# Patient Record
Sex: Male | Born: 1940 | Race: White | Hispanic: No | Smoking: Never smoker
Health system: Southern US, Community
[De-identification: ages and names within clinical notes are randomized; demographics above are authoritative.]

## PROBLEM LIST (undated history)

## (undated) DIAGNOSIS — I10 Essential (primary) hypertension: Secondary | ICD-10-CM

## (undated) DIAGNOSIS — E119 Type 2 diabetes mellitus without complications: Secondary | ICD-10-CM

## (undated) DIAGNOSIS — C61 Malignant neoplasm of prostate: Secondary | ICD-10-CM

## (undated) HISTORY — DX: Malignant neoplasm of prostate: C61

---

## 2008-08-28 ENCOUNTER — Ambulatory Visit: Admission: RE | Admit: 2008-08-28 | Discharge: 2008-11-13 | Payer: Self-pay | Admitting: Radiation Oncology

## 2013-05-20 ENCOUNTER — Encounter: Payer: Self-pay | Admitting: Cardiovascular Disease

## 2013-06-19 ENCOUNTER — Other Ambulatory Visit: Payer: Self-pay | Admitting: Urology

## 2013-06-19 DIAGNOSIS — C61 Malignant neoplasm of prostate: Secondary | ICD-10-CM

## 2013-06-26 ENCOUNTER — Ambulatory Visit (INDEPENDENT_AMBULATORY_CARE_PROVIDER_SITE_OTHER): Payer: Medicare Other | Admitting: Cardiovascular Disease

## 2013-06-26 ENCOUNTER — Encounter: Payer: Self-pay | Admitting: Cardiovascular Disease

## 2013-06-26 VITALS — BP 144/103 | HR 99 | Ht 71.0 in | Wt 264.0 lb

## 2013-06-26 DIAGNOSIS — E785 Hyperlipidemia, unspecified: Secondary | ICD-10-CM

## 2013-06-26 DIAGNOSIS — I4891 Unspecified atrial fibrillation: Secondary | ICD-10-CM | POA: Insufficient documentation

## 2013-06-26 DIAGNOSIS — I10 Essential (primary) hypertension: Secondary | ICD-10-CM

## 2013-06-26 DIAGNOSIS — E119 Type 2 diabetes mellitus without complications: Secondary | ICD-10-CM

## 2013-06-26 MED ORDER — APIXABAN 5 MG PO TABS: 5.0000 mg | ORAL_TABLET | Freq: Two times a day (BID) | ORAL | Status: DC

## 2013-06-26 MED ORDER — METOPROLOL TARTRATE 50 MG PO TABS
50.0000 mg | ORAL_TABLET | Freq: Two times a day (BID) | ORAL | Status: DC
Start: 2013-06-26 — End: 2014-06-09

## 2013-06-26 NOTE — Progress Notes (Signed)
     [name] [name] Date of Birth  [birth year]       [facility] [address], [address], [place], [place]  [address]   [place], [place]  [address] [place]   Fax  [phone]     Fax [phone]  Problem List: 1. Atrial fibrillation 2. Hypertension 3. Diabetes mellitus 4. Hyperlipidemia  History of Present Illness:  [name] [name] presents today for newly discovered atrial fibrillation. He was sent to our office for a urology EKG. His son have atrial fibrillation and was worked into my schedule for further evaluation.  His Primary medical doctor is Dalia Heading, MD ( [place] ).  He does to see his medical doctor on a regular basis and has never been told about atrial fib.   He has no symptoms of atrial fibrillation. Cannot tell that his heartbeat is irregular. He exercises-walks 2 miles every day. He may have a little shortness breath when he walks up hills but this is very temporary.  He denies any chest pain, syncope, presyncope. He denies any PND or orthopnea.  CHADS VASC2  Score is 3 which corresponds to a high risk of stroke in atrial fibrillation.  Yearly risk of stroke without warfarin treatment is estimated at 3.2.     Current Outpatient Prescriptions  Medication Sig Dispense Refill  . fluticasone (FLONASE) 50 MCG/ACT nasal spray       . hydrALAZINE (APRESOLINE) 100 MG tablet       . latanoprost (XALATAN) 0.005 % ophthalmic solution       . losartan-hydrochlorothiazide (HYZAAR) 100-12.5 MG per tablet       . meclizine (ANTIVERT) 25 MG tablet Take 25 mg by mouth 3 (three) times daily as needed for dizziness.      . metFORMIN (GLUCOPHAGE) 500 MG tablet       . metoprolol tartrate (LOPRESSOR) 25 MG tablet       . simvastatin (ZOCOR) 20 MG tablet        No current facility-administered medications for this visit.     No Known Allergies  No past medical history on file.  No past  surgical history on file.  History  Smoking status  . Never Smoker   Smokeless tobacco  . Not on file    History  Alcohol Use: Not on file    No family history on file.  Reviw of Systems:  Reviewed in the HPI.  All other systems are negative.  Physical Exam: Blood pressure 144/103, pulse 99, height 5\' 11"  (1.803 m), weight 264 lb (119.75 kg). Wt Readings from Last 3 Encounters:  06/26/13 264 lb (119.75 kg)     General: Well developed, well nourished, in no acute distress.  Head: Normocephalic, atraumatic, sclera non-icteric, mucus membranes are moist,   Neck: Supple. Carotids are 2 + without bruits. No JVD   Lungs: Clear   Heart: irreg. Irreg. normla Y5K3, soft systlic murmur  Abdomen: Soft, non-tender, non-distended with normal bowel sounds.  Msk:  Strength and tone are normal   Extremities: No clubbing or cyanosis. No edema.  Distal pedal pulses are 2+ and equal   Neuro: CN II - XII intact.  Alert and oriented X 3.   Psych:  Normal   ECG: Jun 26, 2013:  Atrial fib with V rate of 99,  NS St abn.   Assessment / Plan:

## 2013-06-26 NOTE — Patient Instructions (Addendum)
Your physician has requested that you have an echocardiogram. Echocardiography is a painless test that uses sound waves to create images of your heart. It provides your doctor with information about the size and shape of your heart and how well your heart's chambers and valves are working. This procedure takes approximately one hour. There are no restrictions for this procedure.  Your physician has recommended you make the following change in your medication:  INCREASE Metoprolol to 50 mg twice daily START Eliquis 5 mg twice daily  Your physician wants you to follow-up in: 2 months with Dr. Acie Fredrickson - Monday July 13 @ 10:15

## 2013-06-26 NOTE — Assessment & Plan Note (Signed)
Represents today for a urology EKG. He was incidentally found to have atrial fibrillation. He is completely asymptomatic. He denies any chest pain or shortness breath dizziness, syncope, or presyncope.  His CHADS VASC2 score is 3.  He likely has paroxysmal atrial fibrillation.  We will increase his metoprolol to 50 mg twice a day. We'll start him on Eliquis 5 mg a day.   We'll get an echocardiogram to evaluate his left ventricular function and valvular function. I'll see him back in the office in 2 months.

## 2013-07-03 ENCOUNTER — Encounter (HOSPITAL_COMMUNITY)
Admission: RE | Admit: 2013-07-03 | Discharge: 2013-07-03 | Disposition: A | Discharge status: Home / Self Care | Payer: Medicare Other | Source: Ambulatory Visit | Attending: Urology | Admitting: Urology

## 2013-07-03 DIAGNOSIS — C61 Malignant neoplasm of prostate: Secondary | ICD-10-CM

## 2013-07-03 MED ORDER — TECHNETIUM TC 99M MEDRONATE IV KIT
24.9000 | PACK | Freq: Once | INTRAVENOUS | Status: AC | PRN
  Administered 2013-07-03: 24.9 via INTRAVENOUS

## 2013-07-08 ENCOUNTER — Inpatient Hospital Stay (HOSPITAL_COMMUNITY): Admission: RE | Admit: 2013-07-08 | Payer: Medicare Other | Source: Ambulatory Visit

## 2013-07-09 ENCOUNTER — Ambulatory Visit (HOSPITAL_COMMUNITY)
Admission: RE | Admit: 2013-07-09 | Discharge: 2013-07-09 | Disposition: A | Discharge status: Home / Self Care | Payer: Medicare Other | Source: Ambulatory Visit | Attending: Cardiovascular Disease | Admitting: Cardiovascular Disease

## 2013-07-09 DIAGNOSIS — I059 Rheumatic mitral valve disease, unspecified: Secondary | ICD-10-CM

## 2013-07-09 DIAGNOSIS — I10 Essential (primary) hypertension: Secondary | ICD-10-CM | POA: Insufficient documentation

## 2013-07-09 DIAGNOSIS — E119 Type 2 diabetes mellitus without complications: Secondary | ICD-10-CM | POA: Insufficient documentation

## 2013-07-09 DIAGNOSIS — E785 Hyperlipidemia, unspecified: Secondary | ICD-10-CM | POA: Insufficient documentation

## 2013-07-09 DIAGNOSIS — I4891 Unspecified atrial fibrillation: Secondary | ICD-10-CM | POA: Insufficient documentation

## 2013-07-09 NOTE — Progress Notes (Signed)
  Echocardiogram 2D Echocardiogram has been performed.  Su Grand Embree Brawley 07/09/2013, 9:14 AM

## 2013-07-11 ENCOUNTER — Telehealth: Payer: Self-pay | Admitting: Cardiovascular Disease

## 2013-07-11 NOTE — Telephone Encounter (Signed)
New message ° ° ° ° °Want test results °

## 2013-07-11 NOTE — Telephone Encounter (Signed)
Reviewed echocardiogram results with patient who verbalized understanding and gratitude.

## 2013-07-26 ENCOUNTER — Other Ambulatory Visit (HOSPITAL_COMMUNITY): Payer: Self-pay | Admitting: Urology

## 2013-07-26 DIAGNOSIS — C61 Malignant neoplasm of prostate: Secondary | ICD-10-CM

## 2013-08-26 ENCOUNTER — Ambulatory Visit (INDEPENDENT_AMBULATORY_CARE_PROVIDER_SITE_OTHER): Payer: Medicare Other | Admitting: Cardiovascular Disease

## 2013-08-26 ENCOUNTER — Encounter: Payer: Self-pay | Admitting: Cardiovascular Disease

## 2013-08-26 VITALS — BP 140/90 | HR 80 | Ht 71.0 in | Wt 258.2 lb

## 2013-08-26 DIAGNOSIS — I482 Chronic atrial fibrillation, unspecified: Secondary | ICD-10-CM

## 2013-08-26 DIAGNOSIS — I4891 Unspecified atrial fibrillation: Secondary | ICD-10-CM

## 2013-08-26 NOTE — Patient Instructions (Signed)
Your physician recommends that you continue on your current medications as directed. Please refer to the Current Medication list given to you today.  Your physician wants you to follow-up in: 6 months with Dr. Nahser.  You will receive a reminder letter in the mail two months in advance. If you don't receive a letter, please call our office to schedule the follow-up appointment.  

## 2013-08-26 NOTE — Progress Notes (Signed)
[name] [name] Date of Birth  07-09-1940       [facility] [address], [address], [place], [place]  [address]   [place], [place]  [address] [place]   Fax  [phone]     Fax [phone]  Problem List: 1. Atrial fibrillation 2. Hypertension 3. Diabetes mellitus 4. Hyperlipidemia  History of Present Illness:  [name] [name] presents today for newly discovered atrial fibrillation. He was sent to our office for a urology EKG. His son have atrial fibrillation and was worked into my schedule for further evaluation.  His Primary medical doctor is Dalia Heading, MD ( [place] ).  He does to see his medical doctor on a regular basis and has never been told about atrial fib.   He has no symptoms of atrial fibrillation. Cannot tell that his heartbeat is irregular. He exercises-walks 2 miles every day. He may have a little shortness breath when he walks up hills but this is very temporary.  He denies any chest pain, syncope, presyncope. He denies any PND or orthopnea.  CHADS VASC2  Score is 3 which corresponds to a high risk of stroke in atrial fibrillation.  Yearly risk of stroke without warfarin treatment is estimated at 3.2.    August 26, 2013:  [name] [name] is doing well - walking 2 miles a day most days of the week.   Occasionally can feel that his heart is irregular but is not .   Current Outpatient Prescriptions  Medication Sig Dispense Refill  . apixaban (ELIQUIS) 5 MG TABS tablet Take 1 tablet (5 mg total) by mouth 2 (two) times daily.  60 tablet  11  . fluticasone (FLONASE) 50 MCG/ACT nasal spray       . hydrALAZINE (APRESOLINE) 100 MG tablet       . latanoprost (XALATAN) 0.005 % ophthalmic solution       . losartan-hydrochlorothiazide (HYZAAR) 100-12.5 MG per tablet       . meclizine (ANTIVERT) 25 MG tablet Take 25 mg by mouth 3 (three) times daily as needed for dizziness.      . metFORMIN  (GLUCOPHAGE) 500 MG tablet       . metoprolol (LOPRESSOR) 50 MG tablet Take 1 tablet (50 mg total) by mouth 2 (two) times daily.  180 tablet  3  . simvastatin (ZOCOR) 20 MG tablet        No current facility-administered medications for this visit.     No Known Allergies  No past medical history on file.  No past surgical history on file.  History  Smoking status  . Never Smoker   Smokeless tobacco  . Not on file    History  Alcohol Use: Not on file    No family history on file.  Reviw of Systems:  Reviewed in the HPI.  All other systems are negative.  Physical Exam: Blood pressure 140/90, pulse 80, height 5\' 11"  (1.803 m), weight 258 lb 3.2 oz (117.119 kg). Wt Readings from Last 3 Encounters:  08/26/13 258 lb 3.2 oz (117.119 kg)  06/26/13 264 lb (119.75 kg)     General: Well developed, well nourished, in no acute distress.  Head: Normocephalic, atraumatic, sclera non-icteric, mucus membranes are moist,   Neck: Supple. Carotids are 2 + without bruits. No JVD   Lungs: Clear   Heart: irreg. Irreg. normla Y1V4, soft systlic murmur  Abdomen: Soft,  non-tender, non-distended with normal bowel sounds.  Msk:  Strength and tone are normal   Extremities: No clubbing or cyanosis. No edema.  Distal pedal pulses are 2+ and equal   Neuro: CN II - XII intact.  Alert and oriented X 3.   Psych:  Normal   ECG: Jun 26, 2013:  Atrial fib with V rate of 99,  NS St abn.   Assessment / Plan:

## 2013-08-26 NOTE — Assessment & Plan Note (Signed)
He is asymptomatic.  Able to do all of his normal activities without problems. Discussed cardioversion - he wants to discuss with his brother who has had some heart problems.

## 2013-11-22 ENCOUNTER — Ambulatory Visit (INDEPENDENT_AMBULATORY_CARE_PROVIDER_SITE_OTHER): Payer: Medicare Other | Admitting: Urology

## 2013-11-22 DIAGNOSIS — C772 Secondary and unspecified malignant neoplasm of intra-abdominal lymph nodes: Secondary | ICD-10-CM

## 2013-11-22 DIAGNOSIS — C61 Malignant neoplasm of prostate: Secondary | ICD-10-CM

## 2013-11-25 ENCOUNTER — Telehealth: Payer: Self-pay | Admitting: Oncology

## 2013-11-25 NOTE — Telephone Encounter (Signed)
LEFT MESSAGE FOR PATIENT TO RETURN CALL TO SCHEDULE NP APPT.  °

## 2013-11-25 NOTE — Telephone Encounter (Signed)
C/D 11/25/13 for appt. 12/05/13

## 2013-11-26 ENCOUNTER — Telehealth: Payer: Self-pay | Admitting: Oncology

## 2013-11-26 NOTE — Telephone Encounter (Signed)
LEFT MESSAGE FOR PATIENT AND GAVE NP APPT FOR 10/22 @ 10:30 W/DR. SHADAD

## 2013-11-27 ENCOUNTER — Telehealth: Payer: Self-pay | Admitting: Oncology

## 2013-11-27 NOTE — Telephone Encounter (Signed)
S/W PATIENT AND GAVE R/S NP APPT FOR 11/03 @ 10:30 W/DR. SHADAD. PATIENT R/S NP WILL BE OUT OF TOWN.  REFERRING DR. Jeffie Pollock DX- CONSIDER FOR CHEMO FOR  BULKY METS PROSTATE CA

## 2013-12-05 ENCOUNTER — Other Ambulatory Visit: Payer: Medicare Other

## 2013-12-05 ENCOUNTER — Ambulatory Visit: Payer: Medicare Other | Admitting: Oncology

## 2013-12-05 ENCOUNTER — Ambulatory Visit: Payer: Medicare Other

## 2013-12-17 ENCOUNTER — Ambulatory Visit: Payer: Medicare Other

## 2013-12-17 ENCOUNTER — Ambulatory Visit (HOSPITAL_BASED_OUTPATIENT_CLINIC_OR_DEPARTMENT_OTHER): Payer: Medicare Other | Admitting: Oncology

## 2013-12-17 ENCOUNTER — Encounter (INDEPENDENT_AMBULATORY_CARE_PROVIDER_SITE_OTHER): Payer: Self-pay

## 2013-12-17 ENCOUNTER — Other Ambulatory Visit: Payer: Medicare Other

## 2013-12-17 ENCOUNTER — Encounter: Payer: Self-pay | Admitting: Oncology

## 2013-12-17 VITALS — BP 134/82 | HR 60 | Temp 98.4°F | Resp 19 | Ht 71.0 in | Wt 264.7 lb

## 2013-12-17 DIAGNOSIS — C61 Malignant neoplasm of prostate: Secondary | ICD-10-CM

## 2013-12-17 NOTE — Progress Notes (Signed)
Reason for Referral: Prostate cancer.  HPI: 73 year old gentleman native of [place] currently residing in [place]. He has a history of hypertension and hyperlipidemia and a diagnosis of prostate cancer dating back to 2010. 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 He was treated with external beam radiation completed in October 2010. His PSA remain under reasonable control to about 2014 when his PSA rose to 165 and subsequently was up to 202. He is imaging studies including a CT scan of the abdomen and pelvis which showed 07/15/2013 to have a 3.0 x 2.1 cm retroperitoneal adenopathy. A left retrocrural lymph node was measuring 1.1 cm. A bone scan at that time showed a single focus of abnormal uptake in the left posterior fourth rib. No clear CT correlation which could suggest trauma.He was started on Firmagon by Dr. Jeffie Pollock in May 2015 and his PSA dropped to 18 in October 2015.he was referred to me for evaluation regarding further therapy.  Clinically, he is completely asymptomatic. He does not report any headaches, blurry vision, double vision or syncope. He does not report any fevers, chills, sweats or weight loss. He does not report any chest pain, palpitation, orthopnea or PND. Does not report any cough or shortness of breath. He does not report any nausea, vomiting, abdominal pain, change in his bowel habits. He does not report any frequency, urgency, hesitancy. Does not report any hematuria or dysuria. He does not report any skeletal complaints. Does not report any arthralgias or myalgias. His performance status remains excellent continues to be active. He is able to drive and attends activities of daily living. Rest of his review of systems unremarkable.  Past Medical History  Diagnosis Date  . Prostate cancer   :   Current Outpatient Prescriptions  Medication Sig Dispense  Refill  . apixaban (ELIQUIS) 5 MG TABS tablet Take 1 tablet (5 mg total) by mouth 2 (two) times daily. 60 tablet 11  . fluticasone (FLONASE) 50 MCG/ACT nasal spray     . hydrALAZINE (APRESOLINE) 100 MG tablet     . latanoprost (XALATAN) 0.005 % ophthalmic solution     . losartan-hydrochlorothiazide (HYZAAR) 100-12.5 MG per tablet     . meclizine (ANTIVERT) 25 MG tablet Take 25 mg by mouth 3 (three) times daily as needed for dizziness.    . metFORMIN (GLUCOPHAGE) 500 MG tablet     . metoprolol (LOPRESSOR) 50 MG tablet Take 1 tablet (50 mg total) by mouth 2 (two) times daily. 180 tablet 3  . simvastatin (ZOCOR) 20 MG tablet      No current facility-administered medications for this visit.      No Known Allergies:    History   Social History  . Marital Status: Single    Spouse Name: N/A    Number of Children: N/A  . Years of Education: N/A   Occupational History  . Not on file.   Social History Main Topics  . Smoking status: Never Smoker   . Smokeless tobacco: Not on file  . Alcohol Use: Not on file  . Drug Use: Not on file  . Sexual Activity: Not on file   Other Topics Concern  . Not on file   Social History Narrative  :  Pertinent items are noted in HPI.  Exam: ECOG 0 Blood pressure 134/82, pulse 60, temperature 98.4 F (36.9 C), temperature source  Oral, resp. rate 19, height 5\' 11"  (1.803 m), weight 264 lb 11.2 oz (120.067 kg), SpO2 100 %. General appearance: alert and cooperative Head: Normocephalic, without obvious abnormality Throat: lips, mucosa, and tongue normal; teeth and gums normal Neck: no adenopathy Resp: clear to auscultation bilaterally Chest wall: no tenderness Cardio: regular rate and rhythm, S1, S2 normal, no murmur, click, rub or gallop GI: soft, non-tender; bowel sounds normal; no masses,  no organomegaly Extremities: No clubbing cyanosis or edema. Neurological: intact no deficits.   Assessment and Plan:   73 year old gentleman with  prostate cancer diagnosed in 2010. He had a Gleason score of 3+4 = 7 and a PSA of 4.4. His disease was in 2 cores and the biopsy. He received external beam radiation as a definitive modality. He developed recurrent disease with retroperitoneal adenopathy and a questionable bony metastasis. His PSA was up to 202. He was treated with androgen deprivation in the last 6 months and a PSA is down to 18.  I discussed the natural course of prostate cancer especially in the advanced setting that is hormone sensitive. I explained to him regardless of any modalities of treatment treatments will be or wheezes be palliative. No curative options exist at this time. I explained to him the role of androgen deprivation and the possibility of developing castration resistant disease and the role of systemic chemotherapy down the line. I also explained to him the recent data to suggest substantial benefit of systemic chemotherapy if used in the androgen sensitive setting especially in the setting of a bulky retroperitoneal adenopathy.  The risks and benefits of systemic chemotherapy in the form of Taxotere given for a total of 6 cycles in this setting was discussed. Complications includes nausea, vomiting, myelosuppression, neutropenia, neutropenic sepsis were all discussed. The benefit varies depends on the individual. We will with bulky disease and extensive bony metastasis or visceral metastasis benefit the most. He will clearly benefit with overall survival but I'm not quite sure how substantial the benefit is. He will probably somewhere around to 6 months improvement in overall survival as an estimate.  After today's discussion, he elected to continue with androgen deprivation only in the immediate future. But he will have low threshold in proceeding with systemic chemotherapy if his PSA plateaus or starts to rise rapidly.he is planning a regardless of a PSA checkups with Dr. Jeffie Pollock as well as his primary care  physician.  He knows to contact me in the near future if that happens. He will proceed with chemotherapy at that time.  All of his questions were answered today to his satisfaction.

## 2013-12-17 NOTE — Progress Notes (Signed)
Checked in new pt with no financial concerns at this time.  Pt has 2 insurance so financial assistance may not be needed but he has my card for any questions or concerns.  ° °

## 2014-02-26 ENCOUNTER — Ambulatory Visit (INDEPENDENT_AMBULATORY_CARE_PROVIDER_SITE_OTHER): Payer: Medicare Other | Admitting: Cardiovascular Disease

## 2014-02-26 ENCOUNTER — Encounter: Payer: Self-pay | Admitting: Cardiovascular Disease

## 2014-02-26 VITALS — BP 145/95 | HR 78 | Ht 71.0 in | Wt 271.8 lb

## 2014-02-26 DIAGNOSIS — I482 Chronic atrial fibrillation, unspecified: Secondary | ICD-10-CM

## 2014-02-26 DIAGNOSIS — I10 Essential (primary) hypertension: Secondary | ICD-10-CM

## 2014-02-26 MED ORDER — APIXABAN 5 MG PO TABS: 5.0000 mg | ORAL_TABLET | Freq: Two times a day (BID) | ORAL | Status: DC

## 2014-02-26 NOTE — Assessment & Plan Note (Signed)
He remains A. Stable. He is completely asymptomatic. Continue with current medications. He is currently on Eliquis.   I will see him in 6 months.

## 2014-02-26 NOTE — Progress Notes (Signed)
[name] [name] Date of Birth  [birth year]       [facility] [address], [place], [place]  [address]   [place], [place]  [address] [place]   Fax  [phone]     Fax [phone]  Problem List: 1. Atrial fibrillation 2. Hypertension 3. Diabetes mellitus 4. Hyperlipidemia  History of Present Illness:  [name] [name] presents today for newly discovered atrial fibrillation. He was sent to our office for a urology EKG. He was found to  have atrial fibrillation and was worked into my schedule for further evaluation.  His Primary medical doctor is Dalia Heading, MD ( [place] ).  He does to see his medical doctor on a regular basis and has never been told about atrial fib.   He has no symptoms of atrial fibrillation. Cannot tell that his heartbeat is irregular. He exercises-walks 2 miles every day. He may have a little shortness breath when he walks up hills but this is very temporary.  He denies any chest pain, syncope, presyncope. He denies any PND or orthopnea.  CHADS VASC2  Score is 3 which corresponds to a high risk of stroke in atrial fibrillation.  Yearly risk of stroke without warfarin treatment is estimated at 3.2.    August 26, 2013:  [name] [name] is doing well - walking 2 miles a day most days of the week.   Occasionally can feel that his heart is irregular but is not .  Jan. 13, 2016:  [name] [name] is a 74 yo with hx of chronic atrial flutter ablation, diabetes mellitus, and hypertension. He has gained some weight since his last visit BP is a bit elevated.  BP is always better at home.  No CP  , dyspnea. Still walking 2 miles a day.    Current Outpatient Prescriptions  Medication Sig Dispense Refill  . apixaban (ELIQUIS) 5 MG TABS tablet Take 1 tablet (5 mg total) by mouth 2 (two) times daily. 60 tablet 11  . fluticasone (FLONASE) 50 MCG/ACT nasal spray     . hydrALAZINE  (APRESOLINE) 100 MG tablet     . latanoprost (XALATAN) 0.005 % ophthalmic solution     . losartan-hydrochlorothiazide (HYZAAR) 100-12.5 MG per tablet     . meclizine (ANTIVERT) 25 MG tablet Take 25 mg by mouth 3 (three) times daily as needed for dizziness.    . metFORMIN (GLUCOPHAGE) 500 MG tablet     . metoprolol (LOPRESSOR) 50 MG tablet Take 1 tablet (50 mg total) by mouth 2 (two) times daily. 180 tablet 3  . simvastatin (ZOCOR) 20 MG tablet      No current facility-administered medications for this visit.     No Known Allergies  Past Medical History  Diagnosis Date  . Prostate cancer     No past surgical history on file.  History  Smoking status  . Never Smoker   Smokeless tobacco  . Not on file    History  Alcohol Use: Not on file    No family history on file.  Reviw of Systems:  Reviewed in the HPI.  All other systems are negative.  Physical Exam: There were no vitals taken for this visit. Wt Readings from Last 3 Encounters:  12/17/13 264 lb 11.2 oz (120.067 kg)  08/26/13 258 lb 3.2 oz (117.119 kg)  06/26/13 264 lb (119.75 kg)     General: Well  developed, well nourished, in no acute distress.  Head: Normocephalic, atraumatic, sclera non-icteric, mucus membranes are moist,   Neck: Supple. Carotids are 2 + without bruits. No JVD   Lungs: Clear   Heart: irreg. Irreg. normla O2V0, soft systlic murmur  Abdomen: Soft, non-tender, non-distended with normal bowel sounds.  Msk:  Strength and tone are normal   Extremities: No clubbing or cyanosis. No edema.  Distal pedal pulses are 2+ and equal   Neuro: CN II - XII intact.  Alert and oriented X 3.   Psych:  Normal   ECG: Jun 26, 2013:  Atrial fib with V rate of 99,  NS St abn.   Assessment / Plan:

## 2014-02-26 NOTE — Assessment & Plan Note (Addendum)
His blood pressure is a little bit elevated. He's gained about 14 pounds over Christmas. I've encouraged him to watch his diet and to increase his exercise. Continue with current medications. i will see him in 6 months .

## 2014-02-26 NOTE — Patient Instructions (Signed)
Your physician recommends that you continue on your current medications as directed. Please refer to the Current Medication list given to you today.  Your physician wants you to follow-up in: 6 months with Dr. Nahser.  You will receive a reminder letter in the mail two months in advance. If you don't receive a letter, please call our office to schedule the follow-up appointment.  

## 2014-02-28 ENCOUNTER — Ambulatory Visit (INDEPENDENT_AMBULATORY_CARE_PROVIDER_SITE_OTHER): Payer: Medicare Other | Admitting: Urology

## 2014-02-28 DIAGNOSIS — R972 Elevated prostate specific antigen [PSA]: Secondary | ICD-10-CM

## 2014-02-28 DIAGNOSIS — C61 Malignant neoplasm of prostate: Secondary | ICD-10-CM

## 2014-02-28 DIAGNOSIS — N3941 Urge incontinence: Secondary | ICD-10-CM

## 2014-05-30 ENCOUNTER — Other Ambulatory Visit: Payer: Self-pay | Admitting: Urology

## 2014-05-30 ENCOUNTER — Ambulatory Visit (INDEPENDENT_AMBULATORY_CARE_PROVIDER_SITE_OTHER): Payer: Medicare Other | Admitting: Urology

## 2014-05-30 DIAGNOSIS — C61 Malignant neoplasm of prostate: Secondary | ICD-10-CM

## 2014-05-30 DIAGNOSIS — C772 Secondary and unspecified malignant neoplasm of intra-abdominal lymph nodes: Secondary | ICD-10-CM

## 2014-05-30 DIAGNOSIS — R972 Elevated prostate specific antigen [PSA]: Secondary | ICD-10-CM | POA: Diagnosis not present

## 2014-06-02 ENCOUNTER — Telehealth: Payer: Self-pay | Admitting: Oncology

## 2014-06-02 NOTE — Telephone Encounter (Signed)
S/W PATIENT AND GAVE APPT FOR 04/22 @ 1

## 2014-06-03 ENCOUNTER — Encounter (HOSPITAL_COMMUNITY)
Admission: RE | Admit: 2014-06-03 | Discharge: 2014-06-03 | Disposition: A | Discharge status: Home / Self Care | Payer: Medicare Other | Source: Ambulatory Visit | Attending: Urology | Admitting: Urology

## 2014-06-03 ENCOUNTER — Ambulatory Visit (HOSPITAL_COMMUNITY)
Admission: RE | Admit: 2014-06-03 | Discharge: 2014-06-03 | Disposition: A | Discharge status: Home / Self Care | Payer: Medicare Other | Source: Ambulatory Visit | Attending: Urology | Admitting: Urology

## 2014-06-03 ENCOUNTER — Encounter (HOSPITAL_COMMUNITY): Payer: Self-pay

## 2014-06-03 DIAGNOSIS — C61 Malignant neoplasm of prostate: Secondary | ICD-10-CM

## 2014-06-03 DIAGNOSIS — Z923 Personal history of irradiation: Secondary | ICD-10-CM | POA: Diagnosis not present

## 2014-06-03 HISTORY — DX: Type 2 diabetes mellitus without complications: E11.9

## 2014-06-03 HISTORY — DX: Essential (primary) hypertension: I10

## 2014-06-03 LAB — POCT I-STAT CREATININE: Creatinine, Ser: 1.3 mg/dL (ref 0.50–1.35)

## 2014-06-03 MED ORDER — TECHNETIUM TC 99M MEDRONATE IV KIT
25.0000 | PACK | Freq: Once | INTRAVENOUS | Status: AC | PRN
  Administered 2014-06-03: 25 via INTRAVENOUS

## 2014-06-03 MED ORDER — IOHEXOL 300 MG/ML  SOLN
100.0000 mL | Freq: Once | INTRAMUSCULAR | Status: AC | PRN
  Administered 2014-06-03: 100 mL via INTRAVENOUS

## 2014-06-04 ENCOUNTER — Encounter: Payer: Self-pay | Admitting: *Deleted

## 2014-06-06 ENCOUNTER — Telehealth: Payer: Self-pay | Admitting: Oncology

## 2014-06-06 ENCOUNTER — Ambulatory Visit (HOSPITAL_BASED_OUTPATIENT_CLINIC_OR_DEPARTMENT_OTHER): Payer: Medicare Other | Admitting: Oncology

## 2014-06-06 VITALS — BP 163/88 | HR 102 | Temp 98.1°F | Resp 18 | Ht 71.0 in | Wt 270.0 lb

## 2014-06-06 DIAGNOSIS — C7951 Secondary malignant neoplasm of bone: Secondary | ICD-10-CM | POA: Diagnosis not present

## 2014-06-06 DIAGNOSIS — E291 Testicular hypofunction: Secondary | ICD-10-CM

## 2014-06-06 DIAGNOSIS — C61 Malignant neoplasm of prostate: Secondary | ICD-10-CM

## 2014-06-06 NOTE — Telephone Encounter (Signed)
Per 04/22 POF, sent msg to Santiago Glad D in radiation for referral to contact pt with D/T..... KJ

## 2014-06-06 NOTE — Progress Notes (Signed)
Hematology and Oncology Follow Up Visit  [name] [name] [id number] August 27, 1940 74 y.o. 06/06/2014 1:10 PM [name] [name], [name], [name], MD   Principle Diagnosis: 74 year old gentleman with prostate cancer diagnosed in 2010. He had a Gleason score of 3+4 = 7 and a PSA of 4.4. His disease was in 2 cores and the biopsy. 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   Prior Therapy: He was treated with external beam radiation completed in October 2010. His PSA remain under reasonable control to about 2014 when his PSA rose to 165 and subsequently was up to 202. He is imaging studies including a CT scan of the abdomen and pelvis which showed 07/15/2013 to have a 3.0 x 2.1 cm retroperitoneal adenopathy. A left retrocrural lymph node was measuring 1.1 cm. A bone scan at that time showed a single focus of abnormal uptake in the left posterior fourth rib. No clear CT correlation which could suggest trauma.He was started on Firmagon by Dr. Jeffie [name] in May 2015 and his PSA dropped to 18 in October 2015. His PSA in January 2016 was down to 17.48 and in April 2016 up again to 48.52. Testosterone level was at a castrate level of 16. Staging workup continue to show metastatic bony disease without any major visceral involvement.  Current therapy: Androgen deprivation under the care of Dr. Jeffie [name] and under consideration for different salvage therapy.  Interim History:  [name] [name] presents today for a follow-up visit. He is a pleasant gentleman I saw in consultation in January 2015 for advanced prostate cancer. Since his last visit, he had a good response to androgen deprivation but most recently developed castration resistant disease. He continues to be asymptomatic however. He has not reported any increased bony pain or decline in his performance status. His appetite remains excellent and he continues to enjoy excellent quality of life. He continues to play golf regularly without any decline.  He is a bit overweight and does report some occasional fatigue.  He does not report any headaches, blurry vision, double vision or syncope. He does not report any fevers, chills, sweats or weight loss. He does not report any chest pain, palpitation, orthopnea or PND. Does not report any cough or shortness of breath. He does not report any nausea, vomiting, abdominal pain, change in his bowel habits. He does not report any frequency, urgency, hesitancy. Does not report any hematuria or dysuria. He does not report any skeletal complaints. Does not report any arthralgias or myalgias. His performance status remains excellent continues to be active. He is able to drive and attends activities of daily living. Rest of his review of systems unremarkable.  Medications: I have reviewed the patient's current medications.  Current Outpatient Prescriptions  Medication Sig Dispense Refill  . apixaban (ELIQUIS) 5 MG TABS tablet Take 1 tablet (5 mg total) by mouth 2 (two) times daily. 90 tablet 3  . fluticasone (FLONASE) 50 MCG/ACT nasal spray Place 2 sprays into both nostrils daily.     . hydrALAZINE (APRESOLINE) 100 MG tablet Take 100 mg by mouth 2 (two) times daily.     Marland Kitchen latanoprost (XALATAN) 0.005 % ophthalmic solution Place 1 drop into both eyes at bedtime.     Marland Kitchen losartan-hydrochlorothiazide (HYZAAR) 100-12.5 MG per tablet Take 1 tablet by mouth daily.     . meclizine (ANTIVERT) 25 MG tablet Take 25 mg by mouth 3 (three) times daily as needed for dizziness.    . metFORMIN (GLUCOPHAGE) 500 MG tablet Take  500 mg by mouth daily with breakfast.     . metoprolol (LOPRESSOR) 50 MG tablet Take 1 tablet (50 mg total) by mouth 2 (two) times daily. 180 tablet 3  . simvastatin (ZOCOR) 20 MG tablet Take 20 mg by mouth daily at 6 PM.      No current facility-administered medications for this visit.     Allergies: No Known Allergies  Past Medical History, Surgical history, Social history, and Family History were  reviewed and updated.   Physical Exam: Blood pressure 163/88, pulse 102, temperature 98.1 F (36.7 C), temperature source Oral, resp. rate 18, height 5\' 11"  (1.803 m), weight 270 lb (122.471 kg), SpO2 99 %. ECOG: 1 General appearance: alert and cooperative Head: Normocephalic, without obvious abnormality Neck: no adenopathy Lymph nodes: Cervical, supraclavicular, and axillary nodes normal. Heart:regular rate and rhythm, S1, S2 normal, no murmur, click, rub or gallop Lung:chest clear, no wheezing, rales, normal symmetric air entry Abdomin: soft, non-tender, without masses or organomegaly EXT:no erythema, induration, or nodules   Lab Results: No results found for: WBC, HGB, HCT, MCV, PLT   Chemistry      Component Value Date/Time   CREATININE 1.30 06/03/2014 1331   No results found for: CALCIUM, ALKPHOS, AST, ALT, BILITOT     Radiological Studies:   EXAM: CT CHEST, ABDOMEN AND PELVIS WITHOUT CONTRAST  TECHNIQUE: Multidetector CT imaging of the chest, abdomen and pelvis was performed following the standard protocol without IV contrast.  COMPARISON: CT of the chest, abdomen and pelvis 07/03/2013.  FINDINGS: CT CHEST FINDINGS  Mediastinum/Lymph Nodes: Heart size is normal. There is no significant pericardial fluid, thickening or pericardial calcification. There is atherosclerosis of the thoracic aorta, the great vessels of the mediastinum and the coronary arteries, including calcified atherosclerotic plaque in the left main, left anterior descending, left circumflex and right coronary arteries. Ectasia of the isthmus of the thoracic aorta which measures 4.0 cm in diameter (unchanged). No pathologically enlarged mediastinal or hilar lymph nodes. Esophagus is unremarkable in appearance. No axillary lymphadenopathy. Additionally, previously noted left supraclavicular lymphadenopathy has resolved.  Lungs/Pleura: No suspicious appearing pulmonary nodules or  masses. Mild scarring in the inferior segment of the lingula and in the dependent portions of the lower lobes of the lungs bilaterally is unchanged. No acute consolidative airspace disease. No pleural effusions.  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 Innumerable sclerotic lesions now noted throughout the visualized axial and appendicular skeleton, compatible with widespread metastatic disease to the bones. 2.9 x 2.7 cm low-attenuation lesion in the subcutaneous fat of the upper back near the midline, similar to the prior study, favored to represent a sebaceous cyst.  CT ABDOMEN AND PELVIS FINDINGS  Hepatobiliary: There is what appears to be an artifact which involves the posterior aspect of segments 2 and 3 of the liver, presumably equipment related. No definite cystic or solid hepatic lesions identified. No intra or extrahepatic biliary ductal dilatation. Gallbladder is normal in appearance.  Pancreas: Unremarkable.  Spleen: Unremarkable.  Adrenals/Urinary Tract: Several tiny nonobstructive calculi are present within the left renal collecting system, largest of which measures 5 mm in the lower pole. 3 mm nonobstructive calculus in the interpolar collecting system of the right kidney is well. Sub cm low-attenuation lesion in the anterior aspect of the interpolar region of the left kidney is too small to definitively characterize, but is similar to the prior examination, favored to represent a small cyst. Mild  bilateral renal atrophy with perinephric stranding (nonspecific). Bilateral adrenal glands are normal in appearance. No hydroureteronephrosis. Urinary bladder is normal in appearance.  Stomach/Bowel: Normal appearance of the stomach. No pathologic dilatation of small bowel or colon. Numerous colonic diverticulae are noted,  most evident in the sigmoid colon, without surrounding inflammatory changes to suggest an acute diverticulitis at this time. Normal appendix.  Vascular/Lymphatic: Atherosclerosis throughout the abdominal and pelvic vasculature, without evidence of aneurysm. Previously noted pelvic and retroperitoneal lymphadenopathy has significantly improved compared to the prior examination, with some residual borderline enlarged pelvic and retroperitoneal lymph nodes, but at this point none of these meet CT criteria for enlargement. The largest residual lymph nodes measure up to 9 mm in short axis in the left external iliac nodal station. No new lymphadenopathy is otherwise noted.  Reproductive: Prostate gland is grossly unremarkable in appearance. Three fiducial markers are again noted in the prostate gland. Seminal vesicles are unremarkable in appearance.  Other: No significant volume of ascites. No pneumoperitoneum.  Musculoskeletal: Multiple sclerotic lesions are now noted throughout the visualized axial and appendicular skeleton, compatible with widespread metastatic disease to the bones. The largest of these sclerotic lesions is in the anterior aspect of the left side of the sacrum immediately medial to the sacroiliac joint, measuring 2.4 x 1.7 cm.  IMPRESSION: 1. Interval development of widespread osseous metastasis throughout the visualized axial and appendicular skeleton. 2. Otherwise, today's study demonstrates a positive response to therapy with resolution of the previously noted pelvic, retroperitoneal and left supraclavicular lymphadenopathy. Additionally, there are no new sites of extralymphatic metastatic disease. 3. Atherosclerosis, including left main and 3 vessel coronary artery disease. In addition, there is ectasia of the isthmus of the thoracic aorta which measures 4.0 cm in diameter (unchanged). 4. Colonic diverticulosis without findings to suggest  acute diverticulitis at this time. 5. Additional incidental findings, as above.   NUCLEAR MEDICINE WHOLE BODY BONE SCAN  TECHNIQUE: Whole body anterior and posterior images were obtained approximately 3 hours after intravenous injection of radiopharmaceutical.  RADIOPHARMACEUTICALS: Twenty-five mCi Technetium-99 MDP  COMPARISON: 07/03/2013  FINDINGS: The previously seen focus of abnormal uptake in the left posterior fourth rib is again noted. New areas of abnormal uptake noted in the posterior ninth rib, the posterior medial sixth rib, T7 vertebral body, right anterior second rib and probable anterior left second and third ribs. Possible abnormal uptake noted in the iliac bones bilaterally near the SI joints, nonspecific. Degenerative uptake in the shoulders, right knee and feet.  IMPRESSION: Multiple new areas of abnormal uptake within the ribs, thoracic spine, and possible bony pelvis concerning for metastatic disease as described above.    Impression and Plan:   Assessment and Plan:   74 year old gentleman with:  1. Prostate cancer diagnosed in 2010. He had a Gleason score of 3+4 = 7 and a PSA of 4.4. His disease was in 2 cores and the biopsy. He received external beam radiation as a definitive modality. He developed recurrent disease with retroperitoneal adenopathy and a bony metastasis. His PSA was up to 202. He was treated with androgen deprivation in the last 6 months and a PSA is down to 18. I most recently was up to 61. Staging workup including bone scan and CT scan were reviewed today and showed multiple area of increased bony uptake in the bone scan. His CT scan did not show any visceral metastasis.  Options of treatment were discussed today extensively. Systemic chemotherapy in the form of Taxotere, Xofigo and cycle line  hormonal therapy in the form of Xtandi or Zytiga were discussed.  Risks and benefits of all these agents were discussed today and  complications were reviewed as well. I think he is a reasonable candidate for all of these modalities. Given the rapid progression of his cancer I would hold off on oral agents for the time being. Given the fact that his disease is predominantly bone only he would benefit from [place] upfront. Subsequent to that, systemic chemotherapy can be used as a second line. The fact that he does not have bone pain might indicate that he would benefit less from [place].  He is interested in learning more and possibly proceeding with Xofigo at this time. I will make a referral for radiation oncology at this time. If he elects to proceed with systemic chemotherapy and I will refer him to see Dr. Whitney Muse at [facility] for proximity.  All his questions were answered today to his satisfaction.  2. Androgen depravation therapy: I have recommended continuing for the time being.   3. Bone directed therapy: I recommended bone directed therapy in the form of Xgeva at this time.  4. Follow-up: Will be upon the completion of Xofigo course sooner if he elects to proceed with definitive treatment.   Zola Button, MD 4/22/20161:10 PM

## 2014-06-06 NOTE — Telephone Encounter (Signed)
[name] [name] called and wanted to confirm that he was seeing Dr. Sondra Come for a xofigo injection.  Advised him that he will be seeing Dr. Sondra Come for consultation for the xofigo injection on Wednesday.  [name] verbalized understanding.

## 2014-06-09 ENCOUNTER — Other Ambulatory Visit: Payer: Self-pay | Admitting: Cardiovascular Disease

## 2014-06-09 NOTE — Progress Notes (Addendum)
Histology and Location of Primary Cancer: prostate cancer - consult for Xofigo injection  Sites of Visceral and Bony Metastatic Disease: bone scan from 06/03/14 shows "Multiple new areas of abnormal uptake within the ribs, thoracic spine, and possible bony pelvis concerning for metastatic disease.  Past/Anticipated chemotherapy by medical oncology, if any: He was treated with androgen deprivation in the last 6 months.  Per Dr. Alen Blew - possible systemic chemotherapy in the form of Taxotere, Xofigo and cycle line hormonal therapy in the form of Xtandi or Zytiga.  Pain on a scale of 0-10 is: 0  If Spine Met(s), symptoms, if any, include:  Bowel/Bladder retention or incontinence (please describe): no  Numbness or weakness in extremities (please describe): no  Current Decadron regimen, if applicable: no  Ambulatory status? Walker? Wheelchair?: Ambulatory  SAFETY ISSUES:  Prior radiation? 2010 to the prostate  Pacemaker/ICD? no  Possible current pregnancy? no  Is the patient on methotrexate? no  Current Complaints / other details: Last PSA was 48.52 on 05/22/14.  He is here with his friend. IPSS score of 11.  BP 125/77 mmHg  Pulse 82  Temp(Src) 97.5 F (36.4 C) (Oral)  Resp 16  Ht 5' 11"  (1.803 m)  Wt 267 lb 6.4 oz (121.292 kg)  BMI 37.31 kg/m2

## 2014-06-11 ENCOUNTER — Ambulatory Visit
Admission: RE | Admit: 2014-06-11 | Discharge: 2014-06-11 | Disposition: A | Discharge status: Home / Self Care | Payer: Medicare Other | Source: Ambulatory Visit | Attending: Radiation Oncology | Admitting: Radiation Oncology

## 2014-06-11 ENCOUNTER — Encounter: Payer: Self-pay | Admitting: Radiation Oncology

## 2014-06-11 VITALS — BP 125/77 | HR 82 | Temp 97.5°F | Resp 16 | Ht 71.0 in | Wt 267.4 lb

## 2014-06-11 DIAGNOSIS — C7951 Secondary malignant neoplasm of bone: Principal | ICD-10-CM

## 2014-06-11 DIAGNOSIS — C61 Malignant neoplasm of prostate: Secondary | ICD-10-CM | POA: Diagnosis not present

## 2014-06-11 LAB — CBC WITH DIFFERENTIAL/PLATELET
BASO%: 0.8 % (ref 0.0–2.0)
BASOS ABS: 0.1 10*3/uL (ref 0.0–0.1)
EOS ABS: 0.1 10*3/uL (ref 0.0–0.5)
EOS%: 1.5 % (ref 0.0–7.0)
HEMATOCRIT: 38.9 % (ref 38.4–49.9)
HGB: 12.7 g/dL — ABNORMAL LOW (ref 13.0–17.1)
LYMPH%: 22.6 % (ref 14.0–49.0)
MCH: 29.9 pg (ref 27.2–33.4)
MCHC: 32.7 g/dL (ref 32.0–36.0)
MCV: 91.6 fL (ref 79.3–98.0)
MONO#: 0.8 10*3/uL (ref 0.1–0.9)
MONO%: 9 % (ref 0.0–14.0)
NEUT#: 5.7 10*3/uL (ref 1.5–6.5)
NEUT%: 66.1 % (ref 39.0–75.0)
Platelets: 218 10*3/uL (ref 140–400)
RBC: 4.24 10*6/uL (ref 4.20–5.82)
RDW: 13.1 % (ref 11.0–14.6)
WBC: 8.6 10*3/uL (ref 4.0–10.3)
lymph#: 1.9 10*3/uL (ref 0.9–3.3)

## 2014-06-11 NOTE — Progress Notes (Signed)
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  HISTORY OF PRESENT ILLNESS::[name] [name] is a 74 y.o. male who is seen out of the courtesy of Dr. Alen Blew for an opinion concerning radiation therapy as part of management of patient's metastatic prostate cancer. Patient has prior history of radiation therapy as detailed below. He unfortunately developed recurrence at a later date and has been on androgen ablation therapy.  His PSA remain under reasonable control to about 2014 when his PSA rose to 165 and subsequently was up to 202. He is imaging studies including a CT scan of the abdomen and pelvis which showed 07/15/2013 to have a 3.0 x 2.1 cm retroperitoneal adenopathy. A left retrocrural lymph node was measuring 1.1 cm. A bone scan at that time showed a single focus of abnormal uptake in the left posterior fourth rib. No clear CT correlation which could suggest trauma.He was started on Firmagon by Dr. Jeffie Pollock in May 2015 and his PSA dropped to 18 in October 2015. His PSA in January 2016 was down to 17.48 and in April 2016 up again to 48.52. Testosterone level was at a castrate level of 16. Staging workup continue to show metastatic bony disease without any major visceral involvement. Recent bone scan shows new osseous metastasis.  He continues to be asymptomatic however. He has not reported any increased bony pain or decline in his performance status. His appetite remains excellent and he continues to enjoy excellent quality of life. He continues to play golf regularly without any decline. He is a bit overweight and does report some occasional fatigue.   Current therapy: Androgen deprivation under the care of Dr.  Jeffie Pollock and under consideration for different salvage therapy.   Denies pain. Reports excessive sweating. Reports muscle soreness after playing golf yesterday.   PREVIOUS RADIATION THERAPY: Yes, 2010 to the prostate, he received 76 gray in 40 fractions. Patient was treated at the [facility] in [place] under my direction  PAST MEDICAL HISTORY:  has a past medical history of Prostate cancer; Diabetes mellitus without complication; and Hypertension.    PAST SURGICAL HISTORY:No past surgical history on file.  FAMILY HISTORY: family history includes Colon cancer in his sister.  SOCIAL HISTORY:  reports that he has never smoked. He has never used smokeless tobacco. He reports that he does not drink alcohol or use illicit drugs.  ALLERGIES: Review of patient's allergies indicates no known allergies.  MEDICATIONS:  Current Outpatient Prescriptions  Medication Sig Dispense Refill  . apixaban (ELIQUIS) 5 MG TABS tablet Take 1 tablet (5 mg total) by mouth 2 (two) times daily. 90 tablet 3  . azelastine (ASTELIN) 0.1 % nasal spray Place 1 spray into both nostrils 2 (two) times daily. Use in each nostril as directed    . fluticasone (FLONASE) 50 MCG/ACT nasal spray Place 2 sprays into both nostrils daily.     . hydrALAZINE (APRESOLINE) 100 MG tablet Take 100 mg by mouth 2 (two) times daily.     Marland Kitchen latanoprost (XALATAN) 0.005 % ophthalmic solution Place 1 drop into both eyes at bedtime.     Marland Kitchen losartan-hydrochlorothiazide (HYZAAR) 100-12.5 MG per tablet Take 1 tablet by mouth daily.     . meclizine (  ANTIVERT) 25 MG tablet Take 25 mg by mouth 3 (three) times daily as needed for dizziness.    . metFORMIN (GLUCOPHAGE) 500 MG tablet Take 500 mg by mouth daily with breakfast.     . metoprolol (LOPRESSOR) 50 MG tablet TAKE ONE TABLET BY MOUTH TWICE A DAY 180 tablet 2  . simvastatin (ZOCOR) 20 MG tablet Take 20 mg by mouth daily at 6 PM.      No current facility-administered medications for this  encounter.    REVIEW OF SYSTEMS:  A 15 point review of systems is documented in the electronic medical record. This was obtained by the nursing staff. However, I reviewed this with the patient to discuss relevant findings and make appropriate changes.  Pertinent items are noted in HPI.   PHYSICAL EXAM:  height is 5\' 11"  (1.803 m) and weight is 267 lb 6.4 oz (121.292 kg). His oral temperature is 97.5 F (36.4 C). His blood pressure is 125/77 and his pulse is 82. His respiration is 16.   BP 125/77 mmHg  Pulse 82  Temp(Src) 97.5 F (36.4 C) (Oral)  Resp 16  Ht 5\' 11"  (1.803 m)  Wt 267 lb 6.4 oz (121.292 kg)  BMI 37.31 kg/m2  General Appearance:    Alert, cooperative, no distress, appears stated age  Head:    Normocephalic, without obvious abnormality, atraumatic  Eyes:    PERRL, conjunctiva/corneas clear, EOM's intact,              Throat:   Lips, mucosa, and tongue normal; and gums normal  Neck:   Supple, symmetrical, trachea midline, no adenopathy;       thyroid:  No enlargement/tenderness/nodules; no carotid   bruit or JVD  Back:     Symmetric, no curvature, ROM normal, no CVA tenderness  Lungs:     Clear to auscultation bilaterally, respirations unlabored  Chest wall:    No tenderness or deformity  Heart:    Regular rate and rhythm, S1 and S2 normal, no murmur, rub   or gallop, no A. fib noted at this time   Abdomen:     Soft, non-tender, bowel sounds active all four quadrants,    no masses, no organomegaly        Extremities:   Extremities normal, atraumatic, no cyanosis or edema  Pulses:   2+ and symmetric all extremities  Skin:   Skin color, texture, turgor normal, no rashes or lesions  Lymph nodes:   Cervical, supraclavicular, and axillary nodes normal        ECOG = 0  LABORATORY DATA:  Lab Results  Component Value Date   WBC 8.6 06/11/2014   HGB 12.7* 06/11/2014   HCT 38.9 06/11/2014   MCV 91.6 06/11/2014   PLT 218 06/11/2014   NEUTROABS 5.7 06/11/2014    Lab Results  Component Value Date   CREATININE 1.30 06/03/2014      RADIOGRAPHY: Ct Chest W Contrast  06/03/2014   CLINICAL DATA:  74 year old male with history prostate cancer diagnosed 4 years ago treated with radiation therapy. Followup examination.  EXAM: CT CHEST, ABDOMEN AND PELVIS WITHOUT CONTRAST  TECHNIQUE: Multidetector CT imaging of the chest, abdomen and pelvis was performed following the standard protocol without IV contrast.  COMPARISON:  CT of the chest, abdomen and pelvis 07/03/2013.  FINDINGS: CT CHEST FINDINGS  Mediastinum/Lymph Nodes: Heart size is normal. There is no significant pericardial fluid, thickening or pericardial calcification. There is atherosclerosis of the thoracic aorta, the great vessels of  the mediastinum and the coronary arteries, including calcified atherosclerotic plaque in the left main, left anterior descending, left circumflex and right coronary arteries. Ectasia of the isthmus of the thoracic aorta which measures 4.0 cm in diameter (unchanged). No pathologically enlarged mediastinal or hilar lymph nodes. Esophagus is unremarkable in appearance. No axillary lymphadenopathy. Additionally, previously noted left supraclavicular lymphadenopathy has resolved.  Lungs/Pleura: No suspicious appearing pulmonary nodules or masses. Mild scarring in the inferior segment of the lingula and in the dependent portions of the lower lobes of the lungs bilaterally is unchanged. No acute consolidative airspace disease. No pleural effusions.  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 Innumerable sclerotic lesions now noted throughout the visualized axial and appendicular skeleton, compatible with widespread metastatic disease to the bones. 2.9 x 2.7 cm low-attenuation lesion in the subcutaneous fat of the upper back near the midline, similar to the  prior study, favored to represent a sebaceous cyst.  CT ABDOMEN AND PELVIS FINDINGS  Hepatobiliary: There is what appears to be an artifact which involves the posterior aspect of segments 2 and 3 of the liver, presumably equipment related. No definite cystic or solid hepatic lesions identified. No intra or extrahepatic biliary ductal dilatation. Gallbladder is normal in appearance.  Pancreas: Unremarkable.  Spleen: Unremarkable.  Adrenals/Urinary Tract: Several tiny nonobstructive calculi are present within the left renal collecting system, largest of which measures 5 mm in the lower pole. 3 mm nonobstructive calculus in the interpolar collecting system of the right kidney is well. Sub cm low-attenuation lesion in the anterior aspect of the interpolar region of the left kidney is too small to definitively characterize, but is similar to the prior examination, favored to represent a small cyst. Mild bilateral renal atrophy with perinephric stranding (nonspecific). Bilateral adrenal glands are normal in appearance. No hydroureteronephrosis. Urinary bladder is normal in appearance.  Stomach/Bowel: Normal appearance of the stomach. No pathologic dilatation of small bowel or colon. Numerous colonic diverticulae are noted, most evident in the sigmoid colon, without surrounding inflammatory changes to suggest an acute diverticulitis at this time. Normal appendix.  Vascular/Lymphatic: Atherosclerosis throughout the abdominal and pelvic vasculature, without evidence of aneurysm. Previously noted pelvic and retroperitoneal lymphadenopathy has significantly improved compared to the prior examination, with some residual borderline enlarged pelvic and retroperitoneal lymph nodes, but at this point none of these meet CT criteria for enlargement. The largest residual lymph nodes measure up to 9 mm in short axis in the left external iliac nodal station. No new lymphadenopathy is otherwise noted.  Reproductive: Prostate gland is  grossly unremarkable in appearance. Three fiducial markers are again noted in the prostate gland. Seminal vesicles are unremarkable in appearance.  Other: No significant volume of ascites.  No pneumoperitoneum.  Musculoskeletal: Multiple sclerotic lesions are now noted throughout the visualized axial and appendicular skeleton, compatible with widespread metastatic disease to the bones. The largest of these sclerotic lesions is in the anterior aspect of the left side of the sacrum immediately medial to the sacroiliac joint, measuring 2.4 x 1.7 cm.  IMPRESSION: 1. Interval development of widespread osseous metastasis throughout the visualized axial and appendicular skeleton. 2. Otherwise, today's study demonstrates a positive response to therapy with resolution of the previously noted pelvic, retroperitoneal and left supraclavicular lymphadenopathy. Additionally, there are no new sites of extralymphatic metastatic disease. 3. Atherosclerosis, including left main and 3 vessel coronary artery disease. In addition, there is ectasia of the isthmus of the  thoracic aorta which measures 4.0 cm in diameter (unchanged). 4. Colonic diverticulosis without findings to suggest acute diverticulitis at this time. 5. Additional incidental findings, as above.   Electronically Signed   By: Vinnie Langton M.D.   On: 06/03/2014 16:11   Nm Bone Scan Whole Body  06/03/2014   CLINICAL DATA:  Prostate cancer.  EXAM: NUCLEAR MEDICINE WHOLE BODY BONE SCAN  TECHNIQUE: Whole body anterior and posterior images were obtained approximately 3 hours after intravenous injection of radiopharmaceutical.  RADIOPHARMACEUTICALS:  Twenty-five mCi Technetium-99 MDP  COMPARISON:  07/03/2013  FINDINGS: The previously seen focus of abnormal uptake in the left posterior fourth rib is again noted. New areas of abnormal uptake noted in the posterior ninth rib, the posterior medial sixth rib, T7 vertebral body, right anterior second rib and probable anterior  left second and third ribs. Possible abnormal uptake noted in the iliac bones bilaterally near the SI joints, nonspecific. Degenerative uptake in the shoulders, right knee and feet.  IMPRESSION: Multiple new areas of abnormal uptake within the ribs, thoracic spine, and possible bony pelvis concerning for metastatic disease as described above.   Electronically Signed   By: Rolm Baptise M.D.   On: 06/03/2014 13:57   Ct Abdomen Pelvis W Contrast  06/03/2014   CLINICAL DATA:  74 year old male with history prostate cancer diagnosed 4 years ago treated with radiation therapy. Followup examination.  EXAM: CT CHEST, ABDOMEN AND PELVIS WITHOUT CONTRAST  TECHNIQUE: Multidetector CT imaging of the chest, abdomen and pelvis was performed following the standard protocol without IV contrast.  COMPARISON:  CT of the chest, abdomen and pelvis 07/03/2013.  FINDINGS: CT CHEST FINDINGS  Mediastinum/Lymph Nodes: Heart size is normal. There is no significant pericardial fluid, thickening or pericardial calcification. There is atherosclerosis of the thoracic aorta, the great vessels of the mediastinum and the coronary arteries, including calcified atherosclerotic plaque in the left main, left anterior descending, left circumflex and right coronary arteries. Ectasia of the isthmus of the thoracic aorta which measures 4.0 cm in diameter (unchanged). No pathologically enlarged mediastinal or hilar lymph nodes. Esophagus is unremarkable in appearance. No axillary lymphadenopathy. Additionally, previously noted left supraclavicular lymphadenopathy has resolved.  Lungs/Pleura: No suspicious appearing pulmonary nodules or masses. Mild scarring in the inferior segment of the lingula and in the dependent portions of the lower lobes of the lungs bilaterally is unchanged. No acute consolidative airspace disease. No pleural effusions.  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 Innumerable sclerotic lesions now noted throughout the visualized axial and appendicular skeleton, compatible with widespread metastatic disease to the bones. 2.9 x 2.7 cm low-attenuation lesion in the subcutaneous fat of the upper back near the midline, similar to the prior study, favored to represent a sebaceous cyst.  CT ABDOMEN AND PELVIS FINDINGS  Hepatobiliary: There is what appears to be an artifact which involves the posterior aspect of segments 2 and 3 of the liver, presumably equipment related. No definite cystic or solid hepatic lesions identified. No intra or extrahepatic biliary ductal dilatation. Gallbladder is normal in appearance.  Pancreas: Unremarkable.  Spleen: Unremarkable.  Adrenals/Urinary Tract: Several tiny nonobstructive calculi are present within the left renal collecting system, largest of which measures 5 mm in the lower pole. 3 mm nonobstructive calculus in the interpolar collecting system of the right kidney is well. Sub cm low-attenuation lesion in the anterior aspect of the interpolar region of the left kidney is too  small to definitively characterize, but is similar to the prior examination, favored to represent a small cyst. Mild bilateral renal atrophy with perinephric stranding (nonspecific). Bilateral adrenal glands are normal in appearance. No hydroureteronephrosis. Urinary bladder is normal in appearance.  Stomach/Bowel: Normal appearance of the stomach. No pathologic dilatation of small bowel or colon. Numerous colonic diverticulae are noted, most evident in the sigmoid colon, without surrounding inflammatory changes to suggest an acute diverticulitis at this time. Normal appendix.  Vascular/Lymphatic: Atherosclerosis throughout the abdominal and pelvic vasculature, without evidence of aneurysm. Previously noted pelvic and retroperitoneal lymphadenopathy has significantly improved compared to the prior  examination, with some residual borderline enlarged pelvic and retroperitoneal lymph nodes, but at this point none of these meet CT criteria for enlargement. The largest residual lymph nodes measure up to 9 mm in short axis in the left external iliac nodal station. No new lymphadenopathy is otherwise noted.  Reproductive: Prostate gland is grossly unremarkable in appearance. Three fiducial markers are again noted in the prostate gland. Seminal vesicles are unremarkable in appearance.  Other: No significant volume of ascites.  No pneumoperitoneum.  Musculoskeletal: Multiple sclerotic lesions are now noted throughout the visualized axial and appendicular skeleton, compatible with widespread metastatic disease to the bones. The largest of these sclerotic lesions is in the anterior aspect of the left side of the sacrum immediately medial to the sacroiliac joint, measuring 2.4 x 1.7 cm.  IMPRESSION: 1. Interval development of widespread osseous metastasis throughout the visualized axial and appendicular skeleton. 2. Otherwise, today's study demonstrates a positive response to therapy with resolution of the previously noted pelvic, retroperitoneal and left supraclavicular lymphadenopathy. Additionally, there are no new sites of extralymphatic metastatic disease. 3. Atherosclerosis, including left main and 3 vessel coronary artery disease. In addition, there is ectasia of the isthmus of the thoracic aorta which measures 4.0 cm in diameter (unchanged). 4. Colonic diverticulosis without findings to suggest acute diverticulitis at this time. 5. Additional incidental findings, as above.   Electronically Signed   By: Vinnie Langton M.D.   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 He would appear to be a candidate for Xofigo. He however does not have any significant symptoms from his osseous metastases. his blood indices from today are adequate to  receive radium 223 injection.  PLAN: Will check with the company to see if he still qualifies for the medication since his osseous metastasis are not causing significant pain at this time.    This document serves as a record of services personally performed by Gery Pray, MD. It was created on his behalf by Pearlie Oyster, a trained medical scribe. The creation of this record is based on the scribe's personal observations and the provider's statements to them. This document has been checked and approved by the attending provider.       ------------------------------------------------  Blair Promise, PhD, MD

## 2014-06-11 NOTE — Progress Notes (Signed)
Please see the Nurse Progress Note in the MD Initial Consult Encounter for this patient. 

## 2014-06-13 ENCOUNTER — Telehealth: Payer: Self-pay | Admitting: Oncology

## 2014-06-13 NOTE — Telephone Encounter (Signed)
Called [name] and notified him that his blood work was good per Dr. Sondra Come.  He asked if Dr. Sondra Come had heard anything from his insurance regarding his Xofigo injections.  Advised him the Dr. Sondra Come will be notified and we will call him back.  [name] [name] verbalized agreement.

## 2014-06-13 NOTE — Telephone Encounter (Signed)
Notified Amani that Dr. Sondra Come has not heard back from his insurance regarding the Xofigo injection.  Hayzen verbalized understanding.

## 2014-06-23 ENCOUNTER — Telehealth: Payer: Self-pay | Admitting: *Deleted

## 2014-06-23 NOTE — Telephone Encounter (Signed)
Patient calling to say, that the treatment dr Alen Blew suggested for his bone cancer, was denied by his insurance company. Note to dr Hazeline Junker desk.

## 2014-06-30 ENCOUNTER — Telehealth: Payer: Self-pay

## 2014-06-30 NOTE — Telephone Encounter (Signed)
Pt called again concerned that his insurance company denied the treatment that Dr Alen Blew ordered. He is asking what is the next step. He will be out of town from 5/17 - 5/21. Dr Dwana Curd told pt the insurance declined him. Can call cell phone while pt is out of town.

## 2014-07-07 ENCOUNTER — Telehealth: Payer: Self-pay | Admitting: *Deleted

## 2014-07-07 NOTE — Telephone Encounter (Signed)
TC from patient asking about what the next steps atre for his prostate cancer treatment as the insurance company denied the 1st option.  He has been waittng approx. 3 weeks to hear back from Dr. Alen Blew and is now becoming quite anxious. Please call pt @ home in the am @ [phone] unitl 11am. Between 11am and 3pm call his cell # @ [phone]. After 3pm he will be at home again.

## 2014-07-08 ENCOUNTER — Other Ambulatory Visit: Payer: Self-pay | Admitting: Oncology

## 2014-07-08 ENCOUNTER — Telehealth: Payer: Self-pay | Admitting: Oncology

## 2014-07-08 ENCOUNTER — Encounter: Payer: Self-pay | Admitting: Oncology

## 2014-07-08 ENCOUNTER — Encounter: Payer: Self-pay | Admitting: *Deleted

## 2014-07-08 DIAGNOSIS — C61 Malignant neoplasm of prostate: Secondary | ICD-10-CM

## 2014-07-08 MED ORDER — ENZALUTAMIDE 40 MG PO CAPS: 160.0000 mg | ORAL_CAPSULE | Freq: Every day | ORAL | Status: DC

## 2014-07-08 NOTE — Progress Notes (Signed)
Script for xtandi , given to raquel in managed care, for financial assistance.

## 2014-07-08 NOTE — Progress Notes (Signed)
I faxed [facility] for poss asst with xtandi. [phone]

## 2014-07-08 NOTE — Telephone Encounter (Signed)
s.w. pt and advised on JUNE appt.....pt ok and aware °

## 2014-07-08 NOTE — Progress Notes (Signed)
I was informed of that [name] [name] has been denied and patient want to discuss different options. I called of [name] [name] today and discussed again the alternatives for him. We have had a lengthy discussion previously regarding chemotherapy versus xtandi. After discussing the risks and benefits again today, he have elected to proceed with Xtandi. He wants to defer chemotherapy for now.  He is agreeable to proceed and we will start the process to get him the medications since possible. He'll also have a follow-up after he has been on this medication for a few weeks.

## 2014-07-09 ENCOUNTER — Encounter: Payer: Self-pay | Admitting: Oncology

## 2014-07-09 NOTE — Progress Notes (Signed)
Per Pearl at [facility] xtandi is going to require prior auth and they will do it.

## 2014-07-11 ENCOUNTER — Telehealth: Payer: Self-pay | Admitting: *Deleted

## 2014-07-11 NOTE — Telephone Encounter (Signed)
[name] [name] with [employer] called reporting there is a Category X interaction with Xtandi and Eliquis.  Would like a return call [phone]) to confirm Dr. Alen Blew is aware.  Xtandi decreases the serum concentration of eliquis.

## 2014-07-15 ENCOUNTER — Telehealth: Payer: Self-pay | Admitting: Cardiovascular Disease

## 2014-07-15 NOTE — Telephone Encounter (Signed)
Pt calling re medication he just started , Eliquis, he has to start [facility] as well for cancer tx and was told it may have an adverse effect with the Eliquis --pls call [phone]

## 2014-07-15 NOTE — Telephone Encounter (Signed)
Gay Filler,  Will you take at look at this drug / drug interaction.  Thanks  [employer]

## 2014-07-15 NOTE — Telephone Encounter (Signed)
Patient will be starting Xtandi for cancer treatment, was told that it could have an adverse effect with his Eliquis. Looking at drug interaction, Enzalutamide Gillermina Phy) may reduce the blood levels of apixaban (Eliquis), which may make the medication less effective in some cases. Will forward to Dr. Acie Fredrickson to see if he needs to prescribe alternatives that do not interact or a dose adjustment or more frequent monitoring to safely use both medications. Patient stated he does not want to take Coumadin.

## 2014-07-15 NOTE — Telephone Encounter (Signed)
This RN phoned Kim at [facility] [employer], [phone], and left a detailed message regarding Xtandi and Eliquis. Dr. Alen Blew is aware that Xtandi decreases the serum concentration of Eliquis. It's OK to refill the Xtandi. Kearney Hard to call [phone] if she had any further questions or concerns.

## 2014-07-17 NOTE — Telephone Encounter (Signed)
Follow up     Pt need to start [facility] for cancer treatment this afternoon.  He needs to know if he can take this with eliquis.  Please call

## 2014-07-17 NOTE — Telephone Encounter (Signed)
I spoke with patient and advised him that Dr. Acie Fredrickson has discussed his questions about Pradaxa with Elberta Leatherwood, PharmD.  I advised patient that per Dr. Acie Fredrickson, there is no documentation of better effectiveness with Pradaxa and Xtandi.  Dr. Acie Fredrickson recommends the patient switch to Coumadin and states INR can be followed by his hematologist when he has other monthly labs done.  Patient states he would like to think about it and will call us back.  I advised him that if he decides to proceed with Coumadin, that we can call Dr. Alen Blew his hematologist/oncologist and verify that he can follow patient's INR.  Patient verbalized understanding and agreement with plan of care.

## 2014-07-17 NOTE — Telephone Encounter (Signed)
After reviewing this with our pharmacist, it does appear that the Xtandi interfers with all the NOACs . I would suggest that we change to coumadin . The coumadin clinic can initiate that if he chooses.

## 2014-07-17 NOTE — Telephone Encounter (Signed)
I closed encounter in error. Spoke with patient and advised him of Dr. Elmarie Shiley advice regarding Eliquis and all NOAC's are less effective with Xtandi.  Patient is aware that Coumadin is an option for the patient since the coagulation of the blood can be measured and dose can be adjusted accordingly.  He states he has reservations about starting Coumadin due to family members that have had adverse effects on the medication.  Patient states someone from his oncology office advised that Pradaxa may be an option.  I advised him that I did speak to the pharmacist, Elberta Leatherwood, about Pradaxa but did not get details regarding whether the risk of stroke was less than with Eliquis. Patient would like more information on Pradaxa.  I advised him that I will discuss with Dr. Acie Fredrickson and Elberta Leatherwood, PharmD and will call him back with that information.  Patient states he is going to go ahead and start the [employer]; states he is aware of his risk and verbalized understanding and agreement with plan of care.

## 2014-07-25 ENCOUNTER — Other Ambulatory Visit: Payer: Self-pay | Admitting: Cardiovascular Disease

## 2014-08-06 ENCOUNTER — Other Ambulatory Visit (HOSPITAL_BASED_OUTPATIENT_CLINIC_OR_DEPARTMENT_OTHER): Payer: Medicare Other

## 2014-08-06 ENCOUNTER — Ambulatory Visit (HOSPITAL_BASED_OUTPATIENT_CLINIC_OR_DEPARTMENT_OTHER): Payer: Medicare Other | Admitting: Oncology

## 2014-08-06 ENCOUNTER — Telehealth: Payer: Self-pay | Admitting: Oncology

## 2014-08-06 VITALS — BP 133/78 | HR 76 | Temp 98.1°F | Resp 18 | Ht 71.0 in | Wt 272.4 lb

## 2014-08-06 DIAGNOSIS — C61 Malignant neoplasm of prostate: Secondary | ICD-10-CM | POA: Diagnosis not present

## 2014-08-06 DIAGNOSIS — C7951 Secondary malignant neoplasm of bone: Secondary | ICD-10-CM

## 2014-08-06 DIAGNOSIS — E291 Testicular hypofunction: Secondary | ICD-10-CM | POA: Diagnosis not present

## 2014-08-06 DIAGNOSIS — I4891 Unspecified atrial fibrillation: Secondary | ICD-10-CM | POA: Diagnosis not present

## 2014-08-06 LAB — CBC WITH DIFFERENTIAL/PLATELET
BASO%: 0.6 % (ref 0.0–2.0)
BASOS ABS: 0 10*3/uL (ref 0.0–0.1)
EOS%: 1.8 % (ref 0.0–7.0)
Eosinophils Absolute: 0.1 10*3/uL (ref 0.0–0.5)
HCT: 36.9 % — ABNORMAL LOW (ref 38.4–49.9)
HGB: 12.4 g/dL — ABNORMAL LOW (ref 13.0–17.1)
LYMPH%: 21.5 % (ref 14.0–49.0)
MCH: 30.9 pg (ref 27.2–33.4)
MCHC: 33.5 g/dL (ref 32.0–36.0)
MCV: 92.2 fL (ref 79.3–98.0)
MONO#: 0.8 10*3/uL (ref 0.1–0.9)
MONO%: 10.7 % (ref 0.0–14.0)
NEUT%: 65.4 % (ref 39.0–75.0)
NEUTROS ABS: 5.1 10*3/uL (ref 1.5–6.5)
PLATELETS: 204 10*3/uL (ref 140–400)
RBC: 4 10*6/uL — AB (ref 4.20–5.82)
RDW: 13.8 % (ref 11.0–14.6)
WBC: 7.8 10*3/uL (ref 4.0–10.3)
lymph#: 1.7 10*3/uL (ref 0.9–3.3)

## 2014-08-06 LAB — COMPREHENSIVE METABOLIC PANEL (CC13)
ALT: 21 U/L (ref 0–55)
ANION GAP: 6 meq/L (ref 3–11)
AST: 18 U/L (ref 5–34)
Albumin: 3.7 g/dL (ref 3.5–5.0)
Alkaline Phosphatase: 94 U/L (ref 40–150)
BILIRUBIN TOTAL: 0.64 mg/dL (ref 0.20–1.20)
BUN: 23.1 mg/dL (ref 7.0–26.0)
CALCIUM: 9.5 mg/dL (ref 8.4–10.4)
CO2: 28 mEq/L (ref 22–29)
CREATININE: 1.2 mg/dL (ref 0.7–1.3)
Chloride: 105 mEq/L (ref 98–109)
EGFR: 58 mL/min/{1.73_m2} — ABNORMAL LOW (ref >90)
Glucose: 112 mg/dl (ref 70–140)
SODIUM: 138 meq/L (ref 136–145)
Total Protein: 6.8 g/dL (ref 6.4–8.3)

## 2014-08-06 NOTE — Telephone Encounter (Signed)
per pof to sch pt appt-gave pt avs °

## 2014-08-06 NOTE — Progress Notes (Signed)
Hematology and Oncology Follow Up Visit  [name] [name] [id number] 08/17/1940 74 y.o. 08/06/2014 12:30 PM [name] [name], [name], [name], MD   Principle Diagnosis: 74 year old gentleman with prostate cancer diagnosed in 2010. He had a Gleason score of 3+4 = 7 and a PSA of 4.4. His disease was in 2 cores and the biopsy. 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   Prior Therapy: He was treated with external beam radiation completed in October 2010. His PSA remain under reasonable control to about 2014 when his PSA rose to 165 and subsequently was up to 202. He is imaging studies including a CT scan of the abdomen and pelvis which showed 07/15/2013 to have a 3.0 x 2.1 cm retroperitoneal adenopathy. A left retrocrural lymph node was measuring 1.1 cm. A bone scan at that time showed a single focus of abnormal uptake in the left posterior fourth rib. No clear CT correlation which could suggest trauma.He was started on Firmagon by Dr. Jeffie [name] in May 2015 and his PSA dropped to 18 in October 2015. His PSA in January 2016 was down to 17.48 and in April 2016 up again to 48.52. Testosterone level was at a castrate level of 16. Staging workup continue to show metastatic bony disease without any major visceral involvement. He developed castration resistant disease with a rising PSA up to 48.  Current therapy:  Androgen deprivation under the care of Dr. Jeffie [name]. Xtandi 160 mg daily started in May 2016.  Interim History:  [name] [name] presents today for a follow-up visit. Since his last visit, his insurance would not approve Xofigo and elected to proceed with xtandi instead. He started [place] and have tolerated it well. He does not report GI complaints of nausea or abdominal pain. Does not report lower extremity edema. He did report mild fatigue but otherwise continue to be the same. He continues to be asymptomatic however. He has not reported any increased bony pain or decline in his  performance status. His appetite remains excellent and he continues to enjoy excellent quality of life. He continues to play golf regularly without any decline. He is a bit overweight and does report some occasional fatigue.  He does not report any headaches, blurry vision, double vision or syncope. He does not report any fevers, chills, sweats or weight loss. He does not report any chest pain, palpitation, orthopnea or PND. Does not report any cough or shortness of breath. He does not report any nausea, vomiting, abdominal pain, change in his bowel habits. He does not report any frequency, urgency, hesitancy. Does not report any hematuria or dysuria. He does not report any skeletal complaints. Does not report any arthralgias or myalgias. His performance status remains excellent continues to be active. He is able to drive and attends activities of daily living. Rest of his review of systems unremarkable.  Medications: I have reviewed the patient's current medications.  Current Outpatient Prescriptions  Medication Sig Dispense Refill  . azelastine (ASTELIN) 0.1 % nasal spray Place 1 spray into both nostrils 2 (two) times daily. Use in each nostril as directed    . ELIQUIS 5 MG TABS tablet Take 1 tablet by mouth two  times daily 180 tablet 3  . enzalutamide (XTANDI) 40 MG capsule Take 4 capsules (160 mg total) by mouth daily. 120 capsule 0  . fluticasone (FLONASE) 50 MCG/ACT nasal spray Place 2 sprays into both nostrils daily.     . hydrALAZINE (APRESOLINE) 100 MG tablet Take 100 mg by  mouth 2 (two) times daily.     Marland Kitchen latanoprost (XALATAN) 0.005 % ophthalmic solution Place 1 drop into both eyes at bedtime.     Marland Kitchen losartan-hydrochlorothiazide (HYZAAR) 100-12.5 MG per tablet Take 1 tablet by mouth daily.     . meclizine (ANTIVERT) 25 MG tablet Take 25 mg by mouth 3 (three) times daily as needed for dizziness.    . metFORMIN (GLUCOPHAGE) 500 MG tablet Take 500 mg by mouth daily with breakfast.     .  metoprolol (LOPRESSOR) 50 MG tablet TAKE ONE TABLET BY MOUTH TWICE A DAY 180 tablet 2  . simvastatin (ZOCOR) 20 MG tablet Take 20 mg by mouth daily at 6 PM.      No current facility-administered medications for this visit.     Allergies: No Known Allergies  Past Medical History, Surgical history, Social history, and Family History were reviewed and updated.   Physical Exam: Blood pressure 133/78, pulse 76, temperature 98.1 F (36.7 C), temperature source Oral, resp. rate 18, height 5\' 11"  (1.803 m), weight 272 lb 6.4 oz (123.56 kg), SpO2 99 %. ECOG: 1 General appearance: alert and cooperative. NAD Head: Normocephalic, without obvious abnormality Neck: no adenopathy Lymph nodes: Cervical, supraclavicular, and axillary nodes normal. Heart:regular rate and rhythm, S1, S2 normal, no murmur, click, rub or gallop Lung:chest clear, no wheezing, rales, normal symmetric air entry Abdomin: soft, non-tender, without masses or organomegaly EXT:no erythema, induration, or nodules   Lab Results: Lab Results  Component Value Date   WBC 7.8 08/06/2014   HGB 12.4* 08/06/2014   HCT 36.9* 08/06/2014   MCV 92.2 08/06/2014   PLT 204 08/06/2014     Chemistry      Component Value Date/Time   NA 138 08/06/2014 1106   K 5.3 No visable hemolysis* 08/06/2014 1106   CO2 28 08/06/2014 1106   BUN 23.1 08/06/2014 1106   CREATININE 1.2 08/06/2014 1106   CREATININE 1.30 06/03/2014 1331      Component Value Date/Time   CALCIUM 9.5 08/06/2014 1106   ALKPHOS 94 08/06/2014 1106   AST 18 08/06/2014 1106   ALT 21 08/06/2014 1106   BILITOT 0.64 08/06/2014 1106        Impression and Plan:   Assessment and Plan:   74 year old gentleman with:  1. Prostate cancer diagnosed in 2010. He had a Gleason score of 3+4 = 7 and a PSA of 4.4. His disease was in 2 cores and the biopsy. He received external beam radiation as a definitive modality. He developed recurrent disease with retroperitoneal  adenopathy and a bony metastasis. His PSA was up to 202. He was treated with androgen deprivation in the last 6 months and a PSA is down to 18. I most recently was up to 55. Staging workup including bone scan and CT scan showed multiple area of increased bony uptake in the bone scan. His CT scan did not show any visceral metastasis.  He is currently on xtandi 160 mg daily started in May 2016. He has tolerated this current medication without any complication. The plan is to continue the same dose and schedule.   2. Androgen depravation therapy: I have recommended continuing for the time being.   3. Bone directed therapy: I recommended bone directed therapy in the form of Xgeva at this time.  4. Atrial fibrillation: He is currently on Eliquis. Potential interaction between these medication were discussed today. Options would include switching to warfarin or aspirin. We will discuss with his cardiologist regarding these  options. In the meantime I gave 1 him for signs or symptoms of bleeding including GI, GU or epistaxis.  5. Follow-up: Will be in 1 month to discuss further complications.    [facility], MD 6/22/201612:30 PM

## 2014-08-08 ENCOUNTER — Telehealth: Payer: Self-pay

## 2014-08-08 ENCOUNTER — Other Ambulatory Visit: Payer: Self-pay | Admitting: *Deleted

## 2014-08-08 DIAGNOSIS — C61 Malignant neoplasm of prostate: Secondary | ICD-10-CM

## 2014-08-08 MED ORDER — ENZALUTAMIDE 40 MG PO CAPS: 160.0000 mg | ORAL_CAPSULE | Freq: Every day | ORAL | Status: DC

## 2014-08-08 NOTE — Telephone Encounter (Signed)
[facility] called stating that pt Extandi will need to be filled at [facility] next time. [facility] is contacting pt and [facility].

## 2014-08-12 ENCOUNTER — Other Ambulatory Visit: Payer: Self-pay | Admitting: Oncology

## 2014-09-01 ENCOUNTER — Encounter: Payer: Self-pay | Admitting: Cardiovascular Disease

## 2014-09-01 ENCOUNTER — Ambulatory Visit (INDEPENDENT_AMBULATORY_CARE_PROVIDER_SITE_OTHER): Payer: Medicare Other | Admitting: Cardiovascular Disease

## 2014-09-01 VITALS — BP 128/88 | HR 78 | Ht 71.0 in | Wt 270.0 lb

## 2014-09-01 DIAGNOSIS — I482 Chronic atrial fibrillation, unspecified: Secondary | ICD-10-CM

## 2014-09-01 DIAGNOSIS — I10 Essential (primary) hypertension: Secondary | ICD-10-CM

## 2014-09-01 NOTE — Progress Notes (Signed)
[name] [name] Date of Birth  08-04-1940       [facility] [address], [place], [place]  [address]   [place], [place]  [address] [address]   Fax  [phone]     Fax [phone]  Problem List: 1. Atrial fibrillation 2. Hypertension 3. Diabetes mellitus 4. Hyperlipidemia  History of Present Illness:  [name] [name] presents today for newly discovered atrial fibrillation. He was sent to our office for a urology EKG. He was found to  have atrial fibrillation and was worked into my schedule for further evaluation.  His Primary medical doctor is [name] Heading, MD ( [place] ).  He does to see his medical doctor on a regular basis and has never been told about atrial fib.   He has no symptoms of atrial fibrillation. Cannot tell that his heartbeat is irregular. He exercises-walks 2 miles every day. He may have a little shortness breath when he walks up hills but this is very temporary.  He denies any chest pain, syncope, presyncope. He denies any PND or orthopnea.  CHADS VASC2  Score is 3 which corresponds to a high risk of stroke in atrial fibrillation.  Yearly risk of stroke without warfarin treatment is estimated at 3.2.    August 26, 2013:  [name] [name] is doing well - walking 2 miles a day most days of the week.   Occasionally can feel that his heart is irregular but is not .  Jan. 13, 2016:  [name] [name] is a 74 yo with hx of chronic atrial flutter ablation, diabetes mellitus, and hypertension. He has gained some weight since his last visit BP is a bit elevated.  BP is always better at home.  No CP  , dyspnea. Still walking 2 miles a day.  September 01, 2014: [name] [name] is doing well.  Walking every day .  No CP    Current Outpatient Prescriptions  Medication Sig Dispense Refill  . azelastine (ASTELIN) 0.1 % nasal spray Place 1 spray into both nostrils 2 (two) times daily. Use in each nostril as  directed    . ELIQUIS 5 MG TABS tablet Take 1 tablet by mouth two  times daily 180 tablet 3  . fluticasone (FLONASE) 50 MCG/ACT nasal spray Place 2 sprays into both nostrils daily.     . hydrALAZINE (APRESOLINE) 100 MG tablet Take 100 mg by mouth 2 (two) times daily.     Marland Kitchen latanoprost (XALATAN) 0.005 % ophthalmic solution Place 1 drop into both eyes at bedtime.     Marland Kitchen losartan-hydrochlorothiazide (HYZAAR) 100-12.5 MG per tablet Take 1 tablet by mouth daily.     . metFORMIN (GLUCOPHAGE) 500 MG tablet Take 500 mg by mouth daily with breakfast.     . metoprolol (LOPRESSOR) 50 MG tablet TAKE ONE TABLET BY MOUTH TWICE A DAY 180 tablet 2  . simvastatin (ZOCOR) 20 MG tablet Take 20 mg by mouth daily at 6 PM.     . XTANDI 40 MG capsule Take 4 capsules (160mg ) by  mouth every day 120 capsule 0  . meclizine (ANTIVERT) 25 MG tablet Take 25 mg by mouth 3 (three) times daily as needed for dizziness.     No current facility-administered medications for this visit.     No Known Allergies  Past Medical History  Diagnosis Date  . Prostate cancer   . Diabetes mellitus without complication   .  Hypertension     No past surgical history on file.  History  Smoking status  . Never Smoker   Smokeless tobacco  . Never Used    History  Alcohol Use No    Comment: drinks occasional beer    Family History  Problem Relation Age of Onset  . Colon cancer Sister     Reviw of Systems:  Reviewed in the HPI.  All other systems are negative.  Physical Exam: Blood pressure 128/88, pulse 78, height 5\' 11"  (1.803 m), weight 122.471 kg (270 lb). Wt Readings from Last 3 Encounters:  09/01/14 122.471 kg (270 lb)  08/06/14 123.56 kg (272 lb 6.4 oz)  06/11/14 121.292 kg (267 lb 6.4 oz)     General: Well developed, well nourished, in no acute distress.  Head: Normocephalic, atraumatic, sclera non-icteric, mucus membranes are moist,   Neck: Supple. Carotids are 2 + without bruits. No JVD   Lungs: Clear    Heart: irreg. Irreg. normla D3H4, soft systlic murmur  Abdomen: Soft, non-tender, non-distended with normal bowel sounds.  Msk:  Strength and tone are normal   Extremities: No clubbing or cyanosis. No edema.  Distal pedal pulses are 2+ and equal   Neuro: CN II - XII intact.  Alert and oriented X 3.   Psych:  Normal   ECG: September 01, 2014: Atrial fib with rate of 78.  ,  LVH with repol .    Assessment / Plan:   1. Atrial fibrillation- he remains in atrial fibrillation. He's completely asymptomatic. His left ventricular systolic function is normal. We'll continue with his same medications. At this point I do not think that we need to consider cardioversion because of his lack of symptoms.  I'll see him in 6 months for follow-up visit.  2. Hypertension - his blood pressures well-controlled.  3. Diabetes mellitus  4. Hyperlipidemia - followed by his general medical doctor.     Kobee Medlen, Wonda Cheng, MD  09/01/2014 9:21 AM    [facility],  [place], [place]  [address] Pager [phone] Phone: [phone]; Fax: [phone]   [facility]  [address] [place], [place]  [address] [phone]    Fax [phone]

## 2014-09-01 NOTE — Patient Instructions (Signed)
Medication Instructions:  Your physician recommends that you continue on your current medications as directed. Please refer to the Current Medication list given to you today.   Labwork: None Ordered   Testing/Procedures: None Ordered   Follow-Up: Your physician wants you to follow-up in: 6 months with Dr. Nahser.  You will receive a reminder letter in the mail two months in advance. If you don't receive a letter, please call our office to schedule the follow-up appointment.     

## 2014-09-05 ENCOUNTER — Other Ambulatory Visit (HOSPITAL_BASED_OUTPATIENT_CLINIC_OR_DEPARTMENT_OTHER): Payer: Medicare Other

## 2014-09-05 ENCOUNTER — Telehealth: Payer: Self-pay | Admitting: Oncology

## 2014-09-05 ENCOUNTER — Ambulatory Visit (HOSPITAL_BASED_OUTPATIENT_CLINIC_OR_DEPARTMENT_OTHER): Payer: Medicare Other | Admitting: Oncology

## 2014-09-05 VITALS — BP 122/69 | HR 83 | Temp 97.7°F | Resp 18 | Ht 71.0 in | Wt 271.0 lb

## 2014-09-05 DIAGNOSIS — I4891 Unspecified atrial fibrillation: Secondary | ICD-10-CM

## 2014-09-05 DIAGNOSIS — C61 Malignant neoplasm of prostate: Secondary | ICD-10-CM | POA: Diagnosis not present

## 2014-09-05 DIAGNOSIS — C7951 Secondary malignant neoplasm of bone: Secondary | ICD-10-CM

## 2014-09-05 DIAGNOSIS — E291 Testicular hypofunction: Secondary | ICD-10-CM | POA: Diagnosis not present

## 2014-09-05 LAB — CBC WITH DIFFERENTIAL/PLATELET
BASO%: 0.4 % (ref 0.0–2.0)
Basophils Absolute: 0 10*3/uL (ref 0.0–0.1)
EOS%: 1.8 % (ref 0.0–7.0)
Eosinophils Absolute: 0.1 10*3/uL (ref 0.0–0.5)
HCT: 38.4 % (ref 38.4–49.9)
HEMOGLOBIN: 12.7 g/dL — AB (ref 13.0–17.1)
LYMPH%: 19.6 % (ref 14.0–49.0)
MCH: 30.7 pg (ref 27.2–33.4)
MCHC: 33.1 g/dL (ref 32.0–36.0)
MCV: 92.7 fL (ref 79.3–98.0)
MONO#: 0.7 10*3/uL (ref 0.1–0.9)
MONO%: 10.1 % (ref 0.0–14.0)
NEUT%: 68.1 % (ref 39.0–75.0)
NEUTROS ABS: 4.5 10*3/uL (ref 1.5–6.5)
PLATELETS: 216 10*3/uL (ref 140–400)
RBC: 4.14 10*6/uL — ABNORMAL LOW (ref 4.20–5.82)
RDW: 13.5 % (ref 11.0–14.6)
WBC: 6.7 10*3/uL (ref 4.0–10.3)
lymph#: 1.3 10*3/uL (ref 0.9–3.3)

## 2014-09-05 LAB — COMPREHENSIVE METABOLIC PANEL (CC13)
ALBUMIN: 3.8 g/dL (ref 3.5–5.0)
ALK PHOS: 84 U/L (ref 40–150)
ALT: 16 U/L (ref 0–55)
AST: 15 U/L (ref 5–34)
Anion Gap: 9 mEq/L (ref 3–11)
BUN: 20.1 mg/dL (ref 7.0–26.0)
CHLORIDE: 105 meq/L (ref 98–109)
CO2: 26 mEq/L (ref 22–29)
CREATININE: 1.2 mg/dL (ref 0.7–1.3)
Calcium: 9.9 mg/dL (ref 8.4–10.4)
EGFR: 61 mL/min/{1.73_m2} — ABNORMAL LOW (ref >90)
Glucose: 131 mg/dl (ref 70–140)
Potassium: 5.1 mEq/L (ref 3.5–5.1)
Sodium: 140 mEq/L (ref 136–145)
Total Bilirubin: 0.75 mg/dL (ref 0.20–1.20)
Total Protein: 6.9 g/dL (ref 6.4–8.3)

## 2014-09-05 NOTE — Progress Notes (Signed)
Hematology and Oncology Follow Up Visit  [name] [name] [id number] January 18, 1941 74 y.o. 09/05/2014 8:48 AM [name] [name], [name], [name], MD   Principle Diagnosis: 74 year old gentleman with prostate cancer diagnosed in 2010. He had a Gleason score of 3+4 = 7 and a PSA of 4.4. His disease was in 2 cores and the biopsy. 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   Prior Therapy: He was treated with external beam radiation completed in October 2010. His PSA remain under reasonable control to about 2014 when his PSA rose to 165 and subsequently was up to 202. He is imaging studies including a CT scan of the abdomen and pelvis which showed 07/15/2013 to have a 3.0 x 2.1 cm retroperitoneal adenopathy. A left retrocrural lymph node was measuring 1.1 cm. A bone scan at that time showed a single focus of abnormal uptake in the left posterior fourth rib. No clear CT correlation which could suggest trauma.He was started on Firmagon by Dr. Jeffie Pollock in May 2015 and his PSA dropped to 18 in October 2015. His PSA in January 2016 was down to 17.48 and in April 2016 up again to 48.52. Testosterone level was at a castrate level of 16. Staging workup continue to show metastatic bony disease without any major visceral involvement. He developed castration resistant disease with a rising PSA up to 48.  Current therapy:  Androgen deprivation under the care of Dr. Jeffie Pollock. Xtandi 160 mg daily started in May 2016.  Interim History:  [name] presents today for a follow-up visit. Since his last visit, he continues to do very well. He reports no complications related to xtandi at this time. He reports feeling the best he has felt in a long time. He does not report GI complaints of nausea or abdominal pain. Does not report lower extremity edema. He did report mild fatigue but otherwise continue to be the same. He continues to be asymptomatic however. He has not reported any increased bony pain or  decline in his performance status. He reports that his blood pressure actually improved in the last few weeks.  He does not report any headaches, blurry vision, double vision or syncope. He does not report any fevers, chills, sweats or weight loss. He does not report any chest pain, palpitation, orthopnea or PND. Does not report any cough or shortness of breath. He does not report any nausea, vomiting, abdominal pain, change in his bowel habits. He does not report any frequency, urgency, hesitancy. Does not report any hematuria or dysuria. He does not report any skeletal complaints. Does not report any arthralgias or myalgias. His performance status remains excellent continues to be active. He is able to drive and attends activities of daily living. Rest of his review of systems unremarkable.  Medications: I have reviewed the patient's current medications.  Current Outpatient Prescriptions  Medication Sig Dispense Refill  . azelastine (ASTELIN) 0.1 % nasal spray Place 1 spray into both nostrils 2 (two) times daily. Use in each nostril as directed    . ELIQUIS 5 MG TABS tablet Take 1 tablet by mouth two  times daily 180 tablet 3  . fluticasone (FLONASE) 50 MCG/ACT nasal spray Place 2 sprays into both nostrils daily.     . hydrALAZINE (APRESOLINE) 100 MG tablet Take 100 mg by mouth 2 (two) times daily.     Marland Kitchen latanoprost (XALATAN) 0.005 % ophthalmic solution Place 1 drop into both eyes at bedtime.     Marland Kitchen losartan-hydrochlorothiazide (HYZAAR) 100-12.5  MG per tablet Take 1 tablet by mouth daily.     . meclizine (ANTIVERT) 25 MG tablet Take 25 mg by mouth 3 (three) times daily as needed for dizziness.    . metFORMIN (GLUCOPHAGE) 500 MG tablet Take 500 mg by mouth daily with breakfast.     . metoprolol (LOPRESSOR) 50 MG tablet TAKE ONE TABLET BY MOUTH TWICE A DAY 180 tablet 2  . simvastatin (ZOCOR) 20 MG tablet Take 20 mg by mouth daily at 6 PM.     . XTANDI 40 MG capsule Take 4 capsules (160mg ) by  mouth  every day 120 capsule 0   No current facility-administered medications for this visit.     Allergies: No Known Allergies  Past Medical History, Surgical history, Social history, and Family History were reviewed and updated.   Physical Exam: Blood pressure 122/69, pulse 83, temperature 97.7 F (36.5 C), temperature source Oral, resp. rate 18, height 5\' 11"  (1.803 m), weight 271 lb (122.925 kg), SpO2 98 %. ECOG: 1 General appearance: alert and cooperative. Well-appearing gentleman. Head: Normocephalic, without obvious abnormality Neck: no adenopathy Lymph nodes: Cervical, supraclavicular, and axillary nodes normal. Heart:regular rate and rhythm, S1, S2 normal, no murmur, click, rub or gallop Lung:chest clear, no wheezing, rales, normal symmetric air entry Abdomin: soft, non-tender, without masses or organomegaly EXT:no erythema, induration, or nodules   Lab Results: Lab Results  Component Value Date   WBC 6.7 09/05/2014   HGB 12.7* 09/05/2014   HCT 38.4 09/05/2014   MCV 92.7 09/05/2014   PLT 216 09/05/2014     Chemistry      Component Value Date/Time   NA 138 08/06/2014 1106   K 5.3 No visable hemolysis* 08/06/2014 1106   CO2 28 08/06/2014 1106   BUN 23.1 08/06/2014 1106   CREATININE 1.2 08/06/2014 1106   CREATININE 1.30 06/03/2014 1331      Component Value Date/Time   CALCIUM 9.5 08/06/2014 1106   ALKPHOS 94 08/06/2014 1106   AST 18 08/06/2014 1106   ALT 21 08/06/2014 1106   BILITOT 0.64 08/06/2014 1106        Impression and Plan:   Assessment and Plan:   74 year old gentleman with:  1. Prostate cancer diagnosed in 2010. He had a Gleason score of 3+4 = 7 and a PSA of 4.4. His disease was in 2 cores and the biopsy. He received external beam radiation as a definitive modality. He developed recurrent disease with retroperitoneal adenopathy and a bony metastasis. His PSA was up to 202. He was treated with androgen deprivation in the last 6 months and a PSA  is down to 18. I most recently was up to 24. Staging workup including bone scan and CT scan showed multiple area of increased bony uptake in the bone scan. His CT scan did not show any visceral metastasis.  He is currently on xtandi 160 mg daily started in May 2016. He has tolerated this current medication without any complication. His PSA is currently pending and benefits responding properly, plan to continue with the same dose and schedule.  2. Androgen depravation therapy: I have recommended continuing for the time being.   3. Bone directed therapy: I recommended bone directed therapy in the form of Xgeva at this time.  4. Atrial fibrillation: He is currently on Eliquis. No bleeding complications noted since the last visit.  5. Follow-up: Will be in 1 month to discuss further complications.    [facility], MD 7/22/20168:48 AM

## 2014-09-05 NOTE — Telephone Encounter (Signed)
per pof to sch pt appt-gave pt copy of avs °

## 2014-09-06 LAB — PSA: PSA: 7.85 ng/mL — ABNORMAL HIGH (ref <=4.00)

## 2014-09-09 ENCOUNTER — Telehealth: Payer: Self-pay | Admitting: *Deleted

## 2014-09-09 NOTE — Telephone Encounter (Signed)
Per Dr. Alen Blew, I informed patient that his PSA level went from 48 to 7.85. Patient verbalized understanding.

## 2014-09-09 NOTE — Telephone Encounter (Signed)
-----   Message from [name], MD sent at 09/09/2014  8:28 AM EDT ----- Please call his PSA. Down from 35.

## 2014-09-10 ENCOUNTER — Other Ambulatory Visit: Payer: Self-pay | Admitting: *Deleted

## 2014-09-10 DIAGNOSIS — C61 Malignant neoplasm of prostate: Secondary | ICD-10-CM

## 2014-09-10 MED ORDER — ENZALUTAMIDE 40 MG PO CAPS: ORAL_CAPSULE | ORAL | Status: DC

## 2014-09-10 NOTE — Telephone Encounter (Signed)
Patient called reporting he has enough Xtandi to take through Sunday evening.  Refill sent to [facility] who has taken over oral oncology agents for [facility].

## 2014-10-08 ENCOUNTER — Other Ambulatory Visit (HOSPITAL_BASED_OUTPATIENT_CLINIC_OR_DEPARTMENT_OTHER): Payer: Medicare Other

## 2014-10-08 ENCOUNTER — Ambulatory Visit (HOSPITAL_BASED_OUTPATIENT_CLINIC_OR_DEPARTMENT_OTHER): Payer: Medicare Other | Admitting: Oncology

## 2014-10-08 ENCOUNTER — Other Ambulatory Visit: Payer: Self-pay | Admitting: *Deleted

## 2014-10-08 ENCOUNTER — Telehealth: Payer: Self-pay | Admitting: Oncology

## 2014-10-08 VITALS — BP 136/81 | HR 80 | Temp 98.2°F | Resp 18 | Ht 71.0 in | Wt 271.8 lb

## 2014-10-08 DIAGNOSIS — C61 Malignant neoplasm of prostate: Secondary | ICD-10-CM

## 2014-10-08 DIAGNOSIS — I4891 Unspecified atrial fibrillation: Secondary | ICD-10-CM | POA: Diagnosis not present

## 2014-10-08 DIAGNOSIS — E291 Testicular hypofunction: Secondary | ICD-10-CM | POA: Diagnosis not present

## 2014-10-08 DIAGNOSIS — C7951 Secondary malignant neoplasm of bone: Secondary | ICD-10-CM | POA: Diagnosis not present

## 2014-10-08 LAB — CBC WITH DIFFERENTIAL/PLATELET
BASO%: 0.3 % (ref 0.0–2.0)
Basophils Absolute: 0 10*3/uL (ref 0.0–0.1)
EOS ABS: 0.1 10*3/uL (ref 0.0–0.5)
EOS%: 1.4 % (ref 0.0–7.0)
HEMATOCRIT: 36.9 % — AB (ref 38.4–49.9)
HGB: 12.3 g/dL — ABNORMAL LOW (ref 13.0–17.1)
LYMPH%: 19 % (ref 14.0–49.0)
MCH: 30.8 pg (ref 27.2–33.4)
MCHC: 33.4 g/dL (ref 32.0–36.0)
MCV: 92.2 fL (ref 79.3–98.0)
MONO#: 0.6 10*3/uL (ref 0.1–0.9)
MONO%: 9.5 % (ref 0.0–14.0)
NEUT#: 4.7 10*3/uL (ref 1.5–6.5)
NEUT%: 69.8 % (ref 39.0–75.0)
PLATELETS: 207 10*3/uL (ref 140–400)
RBC: 4 10*6/uL — ABNORMAL LOW (ref 4.20–5.82)
RDW: 13.3 % (ref 11.0–14.6)
WBC: 6.7 10*3/uL (ref 4.0–10.3)
lymph#: 1.3 10*3/uL (ref 0.9–3.3)

## 2014-10-08 LAB — COMPREHENSIVE METABOLIC PANEL (CC13)
ALT: 15 U/L (ref 0–55)
AST: 15 U/L (ref 5–34)
Albumin: 3.7 g/dL (ref 3.5–5.0)
Alkaline Phosphatase: 78 U/L (ref 40–150)
Anion Gap: 9 mEq/L (ref 3–11)
BUN: 15.5 mg/dL (ref 7.0–26.0)
CALCIUM: 9.6 mg/dL (ref 8.4–10.4)
CO2: 25 mEq/L (ref 22–29)
CREATININE: 1 mg/dL (ref 0.7–1.3)
Chloride: 105 mEq/L (ref 98–109)
EGFR: 72 mL/min/{1.73_m2} — ABNORMAL LOW (ref >90)
Glucose: 114 mg/dl (ref 70–140)
Potassium: 4.1 mEq/L (ref 3.5–5.1)
Sodium: 138 mEq/L (ref 136–145)
TOTAL PROTEIN: 6.7 g/dL (ref 6.4–8.3)
Total Bilirubin: 0.57 mg/dL (ref 0.20–1.20)

## 2014-10-08 MED ORDER — ENZALUTAMIDE 40 MG PO CAPS: ORAL_CAPSULE | ORAL | Status: DC

## 2014-10-08 NOTE — Progress Notes (Signed)
Hematology and Oncology Follow Up Visit  [name] [name] [id number] 03-25-1940 74 y.o. 10/08/2014 9:46 AM [name] [name], [name], [name], MD   Principle Diagnosis: 74 year old gentleman with prostate cancer diagnosed in 2010. He had a Gleason score of 3+4 = 7 and a PSA of 4.4. His disease was in 2 cores and the biopsy. 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   Prior Therapy: He was treated with external beam radiation completed in October 2010. His PSA remain under reasonable control to about 2014 when his PSA rose to 165 and subsequently was up to 202. He is imaging studies including a CT scan of the abdomen and pelvis which showed 07/15/2013 to have a 3.0 x 2.1 cm retroperitoneal adenopathy. A left retrocrural lymph node was measuring 1.1 cm. A bone scan at that time showed a single focus of abnormal uptake in the left posterior fourth rib. No clear CT correlation which could suggest trauma.He was started on Firmagon by Dr. Jeffie [name] in May 2015 and his PSA dropped to 18 in October 2015. His PSA in January 2016 was down to 17.48 and in April 2016 up again to 48.52. Testosterone level was at a castrate level of 16. Staging workup continue to show metastatic bony disease without any major visceral involvement. He developed castration resistant disease with a rising PSA up to 48.  Current therapy:  Androgen deprivation under the care of Dr. Jeffie [name]. Xtandi 160 mg daily started in May 2016.  Interim History:  [name] [name] presents today for a follow-up visit. Since his last visit, he reports no new complications. He continues to exercise regularly walking 2 miles indoors without any decline. He does have grade 1 fatigue at most.   He reports no complications related to Xtandi at this time. He reports feeling the best he has felt in a long time. He does not report GI complaints of nausea or abdominal pain. Does not report lower extremity edema.  He has not reported any  increased bony pain or decline in his performance status. He reports that his blood pressure actually improved in the last few weeks.  He does not report any headaches, blurry vision, double vision or syncope. He does not report any fevers, chills, sweats or weight loss. He does not report any chest pain, palpitation, orthopnea or PND. Does not report any cough or shortness of breath. He does not report any nausea, vomiting, abdominal pain, change in his bowel habits. He does not report any urgency, hesitancy. Does not report any hematuria or dysuria. He does report nocturia and frequency. He does not report any skeletal complaints. Does not report any arthralgias or myalgias. His performance status remains excellent continues to be active. He is able to drive and attends activities of daily living. Rest of his review of systems unremarkable.  Medications: I have reviewed the patient's current medications.  Current Outpatient Prescriptions  Medication Sig Dispense Refill  . azelastine (ASTELIN) 0.1 % nasal spray Place 1 spray into both nostrils 2 (two) times daily. Use in each nostril as directed    . ELIQUIS 5 MG TABS tablet Take 1 tablet by mouth two  times daily 180 tablet 3  . enzalutamide (XTANDI) 40 MG capsule Take 4 capsules (160mg ) by  mouth every day 120 capsule 1  . fluticasone (FLONASE) 50 MCG/ACT nasal spray Place 2 sprays into both nostrils daily.     . hydrALAZINE (APRESOLINE) 100 MG tablet Take 100 mg by mouth 2 (two) times  daily.     . latanoprost (XALATAN) 0.005 % ophthalmic solution Place 1 drop into both eyes at bedtime.     Marland Kitchen losartan-hydrochlorothiazide (HYZAAR) 100-12.5 MG per tablet Take 1 tablet by mouth daily.     . meclizine (ANTIVERT) 25 MG tablet Take 25 mg by mouth 3 (three) times daily as needed for dizziness.    . metFORMIN (GLUCOPHAGE) 500 MG tablet Take 500 mg by mouth daily with breakfast.     . metoprolol (LOPRESSOR) 50 MG tablet TAKE ONE TABLET BY MOUTH TWICE A DAY  180 tablet 2  . simvastatin (ZOCOR) 20 MG tablet Take 20 mg by mouth daily at 6 PM.      No current facility-administered medications for this visit.     Allergies: No Known Allergies  Past Medical History, Surgical history, Social history, and Family History were reviewed and updated.   Physical Exam: Blood pressure 136/81, pulse 80, temperature 98.2 F (36.8 C), temperature source Oral, resp. rate 18, height 5\' 11"  (1.803 m), weight 271 lb 12.8 oz (123.288 kg), SpO2 98 %. ECOG: 1 General appearance: alert and cooperative. Obese gentleman not in any distress. Head: Normocephalic, without obvious abnormality Neck: no adenopathy Lymph nodes: Cervical, supraclavicular, and axillary nodes normal. Heart:regular rate and rhythm, S1, S2 normal, no murmur, click, rub or gallop Lung:chest clear, no wheezing, rales, normal symmetric air entry Abdomin: soft, non-tender, without masses or organomegaly no shifting dullness or ascites. EXT:no erythema, induration, or nodules   Lab Results: Lab Results  Component Value Date   WBC 6.7 10/08/2014   HGB 12.3* 10/08/2014   HCT 36.9* 10/08/2014   MCV 92.2 10/08/2014   PLT 207 10/08/2014     Chemistry      Component Value Date/Time   NA 140 09/05/2014 0824   K 5.1 09/05/2014 0824   CO2 26 09/05/2014 0824   BUN 20.1 09/05/2014 0824   CREATININE 1.2 09/05/2014 0824   CREATININE 1.30 06/03/2014 1331      Component Value Date/Time   CALCIUM 9.9 09/05/2014 0824   ALKPHOS 84 09/05/2014 0824   AST 15 09/05/2014 0824   ALT 16 09/05/2014 0824   BILITOT 0.75 09/05/2014 0824     Results for [name] [name] (MRN [id number]) as of 10/08/2014 09:23  Ref. Range 09/05/2014 08:24  PSA Latest Ref Range: <=4.00 ng/mL 7.85 (H)     Impression and Plan:   Assessment and Plan:   74 year old gentleman with:  1. Prostate cancer diagnosed in 2010. He had a Gleason score of 3+4 = 7 and a PSA of 4.4. His disease was in 2 cores and the biopsy. He  received external beam radiation as a definitive modality. He developed recurrent disease with retroperitoneal adenopathy and a bony metastasis. His PSA was up to 202. He was treated with androgen deprivation in the last 6 months and a PSA is down to 18. I most recently was up to 80. Staging workup including bone scan and CT scan showed multiple area of increased bony uptake in the bone scan. His CT scan did not show any visceral metastasis.  He is currently on xtandi 160 mg daily started in May 2016. His PSA have dropped down to 7.84 from 48 after 2 months of therapy. The plan is to continue with the same dose and schedule given his excellent tolerance.  2. Androgen depravation therapy: I have recommended continuing for the time being.   3. Bone directed therapy: He is a good candidate for it and  we will address it with him in future visits if he is not already receiving it at [facility].  4. Atrial fibrillation: He is currently on Eliquis. No bleeding complications noted since the last visit.  5. Follow-up: Will be in 1 month to discuss further complications.    [facility], MD 8/24/20169:46 AM

## 2014-10-08 NOTE — Telephone Encounter (Signed)
Gave patient avs report and appointments for September.  °

## 2014-10-09 ENCOUNTER — Telehealth: Payer: Self-pay | Admitting: *Deleted

## 2014-10-09 LAB — PSA: PSA: 5.82 ng/mL — ABNORMAL HIGH (ref <=4.00)

## 2014-10-09 NOTE — Telephone Encounter (Signed)
-----   Message from [name], MD sent at 10/09/2014  9:40 AM EDT ----- Please call his PSA. Down again.

## 2014-10-09 NOTE — Telephone Encounter (Signed)
Per Dr. Alen Blew, I informed patient that his PSA was down to 5.82. Patient verbalized understanding.

## 2014-10-13 ENCOUNTER — Encounter: Payer: Self-pay | Admitting: *Deleted

## 2014-10-13 NOTE — Progress Notes (Signed)
Patient calling to say he has not received his medication, xtandi from [facility]. Called [phone] and was put on hold 4 times, did not get to speak to a person. Went to the website and called customer service #. Was told that they did receive his script on the 24th, but they have recently acquired [facility] and have twice the work load and not enough personnel  To keep up with the increase in scripts. States she will put a rush on this and send it to the pharmacy immediately. i explained to her that this was a chemotherapy drug and the patient only has one pill left. i also gave her the patient's home and cell # so that someone from there call him and explain this to him. She stated she would give his #'s to the pharmacist. Note to dr Hazeline Junker desk.

## 2014-10-13 NOTE — Progress Notes (Signed)
Patient calling to say he has not received his xtandi and it was due today. Unable to reach [facility] by phone x 3 today, was on hold each time. Faxed patient's name,d.o.b. And phone numbers, to [facility], for them to call patient re: delivery of xtandi. Patient also given phone  # of [facility], if he chooses to call.

## 2014-11-11 ENCOUNTER — Other Ambulatory Visit: Payer: Self-pay | Admitting: *Deleted

## 2014-11-11 ENCOUNTER — Ambulatory Visit (HOSPITAL_BASED_OUTPATIENT_CLINIC_OR_DEPARTMENT_OTHER): Payer: Medicare Other | Admitting: Oncology

## 2014-11-11 ENCOUNTER — Telehealth: Payer: Self-pay | Admitting: Oncology

## 2014-11-11 ENCOUNTER — Other Ambulatory Visit (HOSPITAL_BASED_OUTPATIENT_CLINIC_OR_DEPARTMENT_OTHER): Payer: Medicare Other

## 2014-11-11 VITALS — BP 135/82 | HR 87 | Temp 97.9°F | Resp 18 | Ht 71.0 in | Wt 268.1 lb

## 2014-11-11 DIAGNOSIS — C61 Malignant neoplasm of prostate: Secondary | ICD-10-CM | POA: Diagnosis not present

## 2014-11-11 LAB — CBC WITH DIFFERENTIAL/PLATELET
BASO%: 0.5 % (ref 0.0–2.0)
Basophils Absolute: 0 10*3/uL (ref 0.0–0.1)
EOS ABS: 0.1 10*3/uL (ref 0.0–0.5)
EOS%: 1.3 % (ref 0.0–7.0)
HEMATOCRIT: 39.7 % (ref 38.4–49.9)
HEMOGLOBIN: 13.3 g/dL (ref 13.0–17.1)
LYMPH%: 21.5 % (ref 14.0–49.0)
MCH: 30.5 pg (ref 27.2–33.4)
MCHC: 33.4 g/dL (ref 32.0–36.0)
MCV: 91.4 fL (ref 79.3–98.0)
MONO#: 0.6 10*3/uL (ref 0.1–0.9)
MONO%: 8.9 % (ref 0.0–14.0)
NEUT%: 67.8 % (ref 39.0–75.0)
NEUTROS ABS: 4.7 10*3/uL (ref 1.5–6.5)
Platelets: 220 10*3/uL (ref 140–400)
RBC: 4.35 10*6/uL (ref 4.20–5.82)
RDW: 13.7 % (ref 11.0–14.6)
WBC: 7 10*3/uL (ref 4.0–10.3)
lymph#: 1.5 10*3/uL (ref 0.9–3.3)

## 2014-11-11 LAB — COMPREHENSIVE METABOLIC PANEL (CC13)
ALT: 16 U/L (ref 0–55)
AST: 17 U/L (ref 5–34)
Albumin: 4 g/dL (ref 3.5–5.0)
Alkaline Phosphatase: 73 U/L (ref 40–150)
Anion Gap: 10 mEq/L (ref 3–11)
BUN: 17 mg/dL (ref 7.0–26.0)
CO2: 26 mEq/L (ref 22–29)
CREATININE: 1.2 mg/dL (ref 0.7–1.3)
Calcium: 9.7 mg/dL (ref 8.4–10.4)
Chloride: 103 mEq/L (ref 98–109)
EGFR: 62 mL/min/{1.73_m2} — ABNORMAL LOW (ref >90)
GLUCOSE: 111 mg/dL (ref 70–140)
Potassium: 4.6 mEq/L (ref 3.5–5.1)
SODIUM: 139 meq/L (ref 136–145)
TOTAL PROTEIN: 7.2 g/dL (ref 6.4–8.3)
Total Bilirubin: 0.71 mg/dL (ref 0.20–1.20)

## 2014-11-11 MED ORDER — ENZALUTAMIDE 40 MG PO CAPS: ORAL_CAPSULE | ORAL | Status: DC

## 2014-11-11 NOTE — Telephone Encounter (Signed)
per pof to sch pt appt-gave pt copy of avs °

## 2014-11-11 NOTE — Progress Notes (Signed)
Hematology and Oncology Follow Up Visit  [name] [name] [id number] 07/07/1940 74 y.o. 11/11/2014 11:54 AM [name] [name], [name], [name], MD   Principle Diagnosis: 74 year old gentleman with prostate cancer diagnosed in 2010. He had a Gleason score of 3+4 = 7 and a PSA of 4.4. His disease was in 2 cores and the biopsy. 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   Prior Therapy: He was treated with external beam radiation completed in October 2010. His PSA remain under reasonable control to about 2014 when his PSA rose to 165 and subsequently was up to 202. He is imaging studies including a CT scan of the abdomen and pelvis which showed 07/15/2013 to have a 3.0 x 2.1 cm retroperitoneal adenopathy. A left retrocrural lymph node was measuring 1.1 cm. A bone scan at that time showed a single focus of abnormal uptake in the left posterior fourth rib. No clear CT correlation which could suggest trauma.He was started on Firmagon by Dr. Jeffie Pollock in May 2015 and his PSA dropped to 18 in October 2015. His PSA in January 2016 was down to 17.48 and in April 2016 up again to 48.52. Testosterone level was at a castrate level of 16. Staging workup continue to show metastatic bony disease without any major visceral involvement. He developed castration resistant disease with a rising PSA up to 48.  Current therapy:  Androgen deprivation under the care of Dr. Jeffie Pollock. Xtandi 160 mg daily started in May 2016.  Interim History:  Mr. [name] [name] presents today for a follow-up visit. Since his last visit, he continues to improve clinically. His activity level has improved and he exercises daily. He intentionally lost 3 pounds and feels better. He is able to play golf for the first time in few months.   He reports no complications related to Xtandi at this time. He does not report GI complaints of nausea or abdominal pain. Does not report lower extremity edema.  He has not reported any increased  bony pain or pathological fractures. Has not reported any constitutional symptoms. He is reporting some difficulties getting this medication shipped to him and timely fashion.  He does not report any headaches, blurry vision, double vision or syncope. He does not report any fevers, chills, sweats or weight loss. He does not report any chest pain, palpitation, orthopnea or PND. Does not report any cough or shortness of breath. He does not report any nausea, vomiting, abdominal pain, change in his bowel habits. He does not report any urgency, hesitancy. Does not report any hematuria or dysuria. He does report nocturia and frequency. He does not report any skeletal complaints. Does not report any arthralgias or myalgias. His performance status remains excellent continues to be active. He is able to drive and attends activities of daily living. Rest of his review of systems unremarkable.  Medications: I have reviewed the patient's current medications.  Current Outpatient Prescriptions  Medication Sig Dispense Refill  . azelastine (ASTELIN) 0.1 % nasal spray Place 1 spray into both nostrils 2 (two) times daily. Use in each nostril as directed    . ELIQUIS 5 MG TABS tablet Take 1 tablet by mouth two  times daily 180 tablet 3  . fluticasone (FLONASE) 50 MCG/ACT nasal spray Place 2 sprays into both nostrils daily.     . hydrALAZINE (APRESOLINE) 100 MG tablet Take 100 mg by mouth 2 (two) times daily.     Marland Kitchen latanoprost (XALATAN) 0.005 % ophthalmic solution Place 1 drop into both  eyes at bedtime.     Marland Kitchen losartan-hydrochlorothiazide (HYZAAR) 100-12.5 MG per tablet Take 1 tablet by mouth daily.     . meclizine (ANTIVERT) 25 MG tablet Take 25 mg by mouth 3 (three) times daily as needed for dizziness.    . metFORMIN (GLUCOPHAGE) 500 MG tablet Take 500 mg by mouth daily with breakfast.     . metoprolol (LOPRESSOR) 50 MG tablet TAKE ONE TABLET BY MOUTH TWICE A DAY 180 tablet 2  . simvastatin (ZOCOR) 20 MG tablet Take  20 mg by mouth daily at 6 PM.     . enzalutamide (XTANDI) 40 MG capsule Take 4 capsules (160mg ) by  mouth every day 120 capsule 1   No current facility-administered medications for this visit.     Allergies: No Known Allergies  Past Medical History, Surgical history, Social history, and Family History were reviewed and updated.   Physical Exam: Blood pressure 135/82, pulse 87, temperature 97.9 F (36.6 C), temperature source Oral, resp. rate 18, height 5\' 11"  (1.803 m), weight 268 lb 1.6 oz (121.609 kg), SpO2 97 %. ECOG: 1 General appearance: alert and cooperative. Not in any distress. Head: Normocephalic, without obvious abnormality no oral ulcers or lesions. Neck: no adenopathy Lymph nodes: Cervical, supraclavicular, and axillary nodes normal. Heart:regular rate and rhythm, S1, S2 normal, no murmur, click, rub or gallop Lung:chest clear, no wheezing, rales, normal symmetric air entry no dullness to percussion. Abdomin: soft, non-tender, without masses or organomegaly no shifting dullness or ascites. EXT:no erythema, induration, or nodules   Lab Results: Lab Results  Component Value Date   WBC 7.0 11/11/2014   HGB 13.3 11/11/2014   HCT 39.7 11/11/2014   MCV 91.4 11/11/2014   PLT 220 11/11/2014     Chemistry      Component Value Date/Time   NA 138 10/08/2014 0855   K 4.1 10/08/2014 0855   CO2 25 10/08/2014 0855   BUN 15.5 10/08/2014 0855   CREATININE 1.0 10/08/2014 0855   CREATININE 1.30 06/03/2014 1331      Component Value Date/Time   CALCIUM 9.6 10/08/2014 0855   ALKPHOS 78 10/08/2014 0855   AST 15 10/08/2014 0855   ALT 15 10/08/2014 0855   BILITOT 0.57 10/08/2014 0855      Results for ZENON, LEAF (MRN [id number]) as of 11/11/2014 11:27  Ref. Range 09/05/2014 08:24 10/08/2014 08:55  PSA Latest Ref Range: <=4.00 ng/mL 7.85 (H) 5.82 (H)     Impression and Plan:   Assessment and Plan:   74 year old gentleman with:  1. Prostate cancer diagnosed in  2010. He had a Gleason score of 3+4 = 7 and a PSA of 4.4. His disease was in 2 cores and the biopsy. He received external beam radiation as a definitive modality. He developed recurrent disease with retroperitoneal adenopathy and a bony metastasis. His PSA was up to 202. He was treated with androgen deprivation in the last 6 months and a PSA is down to 18. I most recently was up to 79. Staging workup including bone scan and CT scan showed multiple area of increased bony uptake in the bone scan. His CT scan did not show any visceral metastasis.  He is currently on Xtandi 160 mg daily started in May 2016 and have tolerated it well. His PSA had an excellent response with the PSA dropping from 48 to 5.82. There is no need for any dose reduction or delay at plan on continuing this current medication. I will refill it for him  for next 2 months.    2. Androgen depravation therapy: I have recommended continuing for the time being.   3. Bone directed therapy: He is a good candidate for it and we'll continue to address in future visits.  4. Atrial fibrillation: He is currently on Eliquis. No bleeding complications noted since the last visit. I see no complications associated with this medication and his current cancer treatment.  5. Follow-up: Will be in 1 month to discuss further complications.    [facility], MD 9/27/201611:54 AM

## 2014-11-12 ENCOUNTER — Telehealth: Payer: Self-pay | Admitting: *Deleted

## 2014-11-12 LAB — PSA: PSA: 5.1 ng/mL — ABNORMAL HIGH (ref <=4.00)

## 2014-11-12 NOTE — Telephone Encounter (Signed)
Per Dr. Shadad, I informed patient of his PSA level. Patient verbalized understanding. 

## 2014-11-12 NOTE — Telephone Encounter (Signed)
-----   Message from Firas N Shadad, MD sent at 11/12/2014  9:16 AM EDT ----- Please call his PSA 

## 2014-12-03 ENCOUNTER — Encounter: Payer: Self-pay | Admitting: *Deleted

## 2014-12-03 ENCOUNTER — Other Ambulatory Visit: Payer: Self-pay | Admitting: *Deleted

## 2014-12-03 DIAGNOSIS — C61 Malignant neoplasm of prostate: Secondary | ICD-10-CM

## 2014-12-03 MED ORDER — ENZALUTAMIDE 40 MG PO CAPS: ORAL_CAPSULE | ORAL | Status: DC

## 2014-12-18 ENCOUNTER — Other Ambulatory Visit (HOSPITAL_BASED_OUTPATIENT_CLINIC_OR_DEPARTMENT_OTHER): Payer: Medicare Other

## 2014-12-18 ENCOUNTER — Telehealth: Payer: Self-pay | Admitting: Oncology

## 2014-12-18 ENCOUNTER — Ambulatory Visit (HOSPITAL_BASED_OUTPATIENT_CLINIC_OR_DEPARTMENT_OTHER): Payer: Medicare Other | Admitting: Oncology

## 2014-12-18 VITALS — BP 143/89 | HR 77 | Temp 98.0°F | Resp 19 | Ht 71.0 in | Wt 264.9 lb

## 2014-12-18 DIAGNOSIS — C7951 Secondary malignant neoplasm of bone: Secondary | ICD-10-CM | POA: Diagnosis not present

## 2014-12-18 DIAGNOSIS — R351 Nocturia: Secondary | ICD-10-CM

## 2014-12-18 DIAGNOSIS — I4891 Unspecified atrial fibrillation: Secondary | ICD-10-CM | POA: Diagnosis not present

## 2014-12-18 DIAGNOSIS — C61 Malignant neoplasm of prostate: Secondary | ICD-10-CM

## 2014-12-18 DIAGNOSIS — E291 Testicular hypofunction: Secondary | ICD-10-CM | POA: Diagnosis not present

## 2014-12-18 LAB — COMPREHENSIVE METABOLIC PANEL (CC13)
ALT: 16 U/L (ref 0–55)
ANION GAP: 8 meq/L (ref 3–11)
AST: 15 U/L (ref 5–34)
Albumin: 3.8 g/dL (ref 3.5–5.0)
Alkaline Phosphatase: 71 U/L (ref 40–150)
BUN: 17.4 mg/dL (ref 7.0–26.0)
CHLORIDE: 105 meq/L (ref 98–109)
CO2: 24 mEq/L (ref 22–29)
CREATININE: 1 mg/dL (ref 0.7–1.3)
Calcium: 9.8 mg/dL (ref 8.4–10.4)
EGFR: 71 mL/min/{1.73_m2} — AB (ref >90)
Glucose: 104 mg/dl (ref 70–140)
POTASSIUM: 4.3 meq/L (ref 3.5–5.1)
Sodium: 137 mEq/L (ref 136–145)
Total Bilirubin: 0.57 mg/dL (ref 0.20–1.20)
Total Protein: 6.9 g/dL (ref 6.4–8.3)

## 2014-12-18 LAB — CBC WITH DIFFERENTIAL/PLATELET
BASO%: 0.4 % (ref 0.0–2.0)
Basophils Absolute: 0 10*3/uL (ref 0.0–0.1)
EOS%: 1.5 % (ref 0.0–7.0)
Eosinophils Absolute: 0.1 10*3/uL (ref 0.0–0.5)
HCT: 39.3 % (ref 38.4–49.9)
HGB: 13.1 g/dL (ref 13.0–17.1)
LYMPH%: 22.2 % (ref 14.0–49.0)
MCH: 31 pg (ref 27.2–33.4)
MCHC: 33.4 g/dL (ref 32.0–36.0)
MCV: 92.9 fL (ref 79.3–98.0)
MONO#: 0.6 10*3/uL (ref 0.1–0.9)
MONO%: 9.4 % (ref 0.0–14.0)
NEUT#: 4.2 10*3/uL (ref 1.5–6.5)
NEUT%: 66.5 % (ref 39.0–75.0)
PLATELETS: 207 10*3/uL (ref 140–400)
RBC: 4.23 10*6/uL (ref 4.20–5.82)
RDW: 14.1 % (ref 11.0–14.6)
WBC: 6.3 10*3/uL (ref 4.0–10.3)
lymph#: 1.4 10*3/uL (ref 0.9–3.3)

## 2014-12-18 NOTE — Telephone Encounter (Signed)
Gave and printed appts ched and avs for pt for DEC  °

## 2014-12-18 NOTE — Progress Notes (Signed)
Hematology and Oncology Follow Up Visit  [name] [name] [id number] April 05, 1940 74 y.o. 12/18/2014 11:59 AM [name] [name], [name], [name], MD   Principle Diagnosis: 74 year old gentleman with prostate cancer diagnosed in 2010. He had a Gleason score of 3+4 = 7 and a PSA of 4.4. His disease was in 2 cores and the biopsy. 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   Prior Therapy: He was treated with external beam radiation completed in October 2010. His PSA remain under reasonable control to about 2014 when his PSA rose to 165 and subsequently was up to 202. He is imaging studies including a CT scan of the abdomen and pelvis which showed 07/15/2013 to have a 3.0 x 2.1 cm retroperitoneal adenopathy. A left retrocrural lymph node was measuring 1.1 cm. A bone scan at that time showed a single focus of abnormal uptake in the left posterior fourth rib. No clear CT correlation which could suggest trauma.He was started on Firmagon by Dr. Jeffie [name] in May 2015 and his PSA dropped to 18 in October 2015. His PSA in January 2016 was down to 17.48 and in April 2016 up again to 48.52. Testosterone level was at a castrate level of 16. Staging workup continue to show metastatic bony disease without any major visceral involvement. He developed castration resistant disease with a rising PSA up to 48.  Current therapy:  Androgen deprivation under the care of Dr. Jeffie [name]. Xtandi 160 mg daily started in May 2016.  Interim History:  [name] [name] presents today for a follow-up visit with his wife. Since his last visit, he traveled to [place] for 4-5 days and was able to golf regularly there. He did not have any decline in his energy her performance status and was able to maintain his compliance with the medication. His activity level has improved and he exercises daily.   He reports no new complications related to Xtandi at this time. He does not report GI complaints of nausea or abdominal pain.  Does not report lower extremity edema.  He has not reported any increased bony pain or pathological fractures. Has not reported any constitutional symptoms. His quality of life remains excellent at this time. He remains of less hot flashes in the last few months.  He does not report any headaches, blurry vision, double vision or syncope. He does not report any fevers, chills, sweats or weight loss. He does not report any chest pain, palpitation, orthopnea or PND. Does not report any cough or shortness of breath. He does not report any nausea, vomiting, abdominal pain, change in his bowel habits. He does not report any urgency, hesitancy. Does not report any hematuria or dysuria. He does report nocturia and frequency. He does not report any skeletal complaints. Does not report any arthralgias or myalgias. His performance status remains excellent continues to be active. He is able to drive and attends activities of daily living. Rest of his review of systems unremarkable.  Medications: I have reviewed the patient's current medications.  Current Outpatient Prescriptions  Medication Sig Dispense Refill  . azelastine (ASTELIN) 0.1 % nasal spray Place 1 spray into both nostrils 2 (two) times daily. Use in each nostril as directed    . ELIQUIS 5 MG TABS tablet Take 1 tablet by mouth two  times daily 180 tablet 3  . enzalutamide (XTANDI) 40 MG capsule Take 4 capsules (160mg ) by  mouth every day 240 capsule 0  . fluticasone (FLONASE) 50 MCG/ACT nasal spray Place 2 sprays  into both nostrils daily.     . hydrALAZINE (APRESOLINE) 100 MG tablet Take 100 mg by mouth 2 (two) times daily.     Marland Kitchen latanoprost (XALATAN) 0.005 % ophthalmic solution Place 1 drop into both eyes at bedtime.     Marland Kitchen losartan-hydrochlorothiazide (HYZAAR) 100-12.5 MG per tablet Take 1 tablet by mouth daily.     . meclizine (ANTIVERT) 25 MG tablet Take 25 mg by mouth 3 (three) times daily as needed for dizziness.    . metFORMIN (GLUCOPHAGE) 500 MG  tablet Take 500 mg by mouth daily with breakfast.     . metoprolol (LOPRESSOR) 50 MG tablet TAKE ONE TABLET BY MOUTH TWICE A DAY 180 tablet 2  . simvastatin (ZOCOR) 20 MG tablet Take 20 mg by mouth daily at 6 PM.      No current facility-administered medications for this visit.     Allergies: No Known Allergies  Past Medical History, Surgical history, Social history, and Family History were reviewed and updated.   Physical Exam: Blood pressure 143/89, pulse 77, temperature 98 F (36.7 C), temperature source Oral, resp. rate 19, height 5\' 11"  (1.803 m), weight 264 lb 14.4 oz (120.158 kg), SpO2 99 %. ECOG: 1 General appearance: alert and cooperative. Obese gentleman without distress. Head: Normocephalic, without obvious abnormality no oral ulcers or lesions. Neck: no adenopathy Lymph nodes: Cervical, supraclavicular, and axillary nodes normal. Heart:regular rate and rhythm, S1, S2 normal, no murmur, click, rub or gallop Lung:chest clear, no wheezing, rales, normal symmetric air entry no dullness to percussion. Abdomin: soft, non-tender, without masses or organomegaly obese abdomen without shifting dullness. EXT:no erythema, induration, or nodules   Lab Results: Lab Results  Component Value Date   WBC 6.3 12/18/2014   HGB 13.1 12/18/2014   HCT 39.3 12/18/2014   MCV 92.9 12/18/2014   PLT 207 12/18/2014     Chemistry      Component Value Date/Time   NA 139 11/11/2014 1116   K 4.6 11/11/2014 1116   CO2 26 11/11/2014 1116   BUN 17.0 11/11/2014 1116   CREATININE 1.2 11/11/2014 1116   CREATININE 1.30 06/03/2014 1331      Component Value Date/Time   CALCIUM 9.7 11/11/2014 1116   ALKPHOS 73 11/11/2014 1116   AST 17 11/11/2014 1116   ALT 16 11/11/2014 1116   BILITOT 0.71 11/11/2014 1116       Results for [name], [name] (MRN [id number]) as of 12/18/2014 11:41  Ref. Range 09/05/2014 08:24 10/08/2014 08:55 11/11/2014 11:16  PSA Latest Ref Range: <=4.00 ng/mL 7.85 (H) 5.82  (H) 5.10 (H)     Impression and Plan:   Assessment and Plan:   74 year old gentleman with:  1. Prostate cancer diagnosed in 2010. He had a Gleason score of 3+4 = 7 and a PSA of 4.4. His disease was in 2 cores and the biopsy. He received external beam radiation as a definitive modality. He developed recurrent disease with retroperitoneal adenopathy and a bony metastasis. His PSA was up to 202. He was treated with androgen deprivation in the last 6 months and a PSA is down to 18. I most recently was up to 61. Staging workup including bone scan and CT scan showed multiple area of increased bony uptake in the bone scan. His CT scan did not show any visceral metastasis.  He is currently on Xtandi 160 mg daily started in May 2016 and have tolerated it well. His PSA continues to be under excellent control and most recently down  to 5.1. The plan is to continue with the same dose and schedule and changed her different salvage regimen and was symptomatic progression.  2. Androgen depravation therapy: I have recommended continuing for the time being.   3. Bone directed therapy: He is a good candidate for it and this will be considered in the future. We have defer that decision until later date.  4. Atrial fibrillation: He is currently on Eliquis. No bleeding complications noted since the last visit.   5. Follow-up: Will be in 4 to 6 weeks.  [facility], MD 11/3/201611:59 AM

## 2014-12-19 ENCOUNTER — Telehealth: Payer: Self-pay | Admitting: *Deleted

## 2014-12-19 LAB — PSA: PSA: 5.01 ng/mL — ABNORMAL HIGH (ref <=4.00)

## 2014-12-19 NOTE — Telephone Encounter (Signed)
As noted below by Dr. Alen Blew, I infomed patient of his PSA level. Patient verbalized understanding.

## 2014-12-19 NOTE — Telephone Encounter (Signed)
-----   Message from [name], MD sent at 12/19/2014  9:05 AM EDT ----- Please call his PSA. Not changed.

## 2015-01-15 ENCOUNTER — Other Ambulatory Visit: Payer: Self-pay | Admitting: *Deleted

## 2015-01-15 DIAGNOSIS — C61 Malignant neoplasm of prostate: Secondary | ICD-10-CM

## 2015-01-15 MED ORDER — ENZALUTAMIDE 40 MG PO CAPS: ORAL_CAPSULE | ORAL | Status: DC

## 2015-01-27 ENCOUNTER — Telehealth: Payer: Self-pay | Admitting: Oncology

## 2015-01-27 ENCOUNTER — Other Ambulatory Visit (HOSPITAL_BASED_OUTPATIENT_CLINIC_OR_DEPARTMENT_OTHER): Payer: Medicare Other

## 2015-01-27 ENCOUNTER — Ambulatory Visit (HOSPITAL_BASED_OUTPATIENT_CLINIC_OR_DEPARTMENT_OTHER): Payer: Medicare Other | Admitting: Oncology

## 2015-01-27 ENCOUNTER — Other Ambulatory Visit (HOSPITAL_COMMUNITY)
Admission: RE | Admit: 2015-01-27 | Discharge: 2015-01-27 | Disposition: A | Discharge status: Home / Self Care | Payer: Medicare Other | Source: Ambulatory Visit | Attending: Oncology | Admitting: Oncology

## 2015-01-27 VITALS — BP 141/76 | HR 79 | Temp 98.1°F | Resp 18 | Ht 71.0 in | Wt 265.7 lb

## 2015-01-27 DIAGNOSIS — C7951 Secondary malignant neoplasm of bone: Secondary | ICD-10-CM | POA: Diagnosis not present

## 2015-01-27 DIAGNOSIS — C61 Malignant neoplasm of prostate: Secondary | ICD-10-CM

## 2015-01-27 DIAGNOSIS — E291 Testicular hypofunction: Secondary | ICD-10-CM | POA: Diagnosis not present

## 2015-01-27 DIAGNOSIS — I4891 Unspecified atrial fibrillation: Secondary | ICD-10-CM

## 2015-01-27 LAB — COMPREHENSIVE METABOLIC PANEL
ALT: 19 U/L (ref 17–63)
ANION GAP: 10 (ref 5–15)
AST: 21 U/L (ref 15–41)
Albumin: 4.1 g/dL (ref 3.5–5.0)
Alkaline Phosphatase: 74 U/L (ref 38–126)
BUN: 21 mg/dL — ABNORMAL HIGH (ref 6–20)
CHLORIDE: 101 mmol/L (ref 101–111)
CO2: 27 mmol/L (ref 22–32)
CREATININE: 1.09 mg/dL (ref 0.61–1.24)
Calcium: 9.5 mg/dL (ref 8.9–10.3)
GFR calc non Af Amer: >60 mL/min (ref >60)
Glucose, Bld: 136 mg/dL — ABNORMAL HIGH (ref 65–99)
POTASSIUM: 4 mmol/L (ref 3.5–5.1)
SODIUM: 138 mmol/L (ref 135–145)
Total Bilirubin: 0.7 mg/dL (ref 0.3–1.2)
Total Protein: 7.1 g/dL (ref 6.5–8.1)

## 2015-01-27 LAB — CBC WITH DIFFERENTIAL/PLATELET
BASO%: 0 % (ref 0.0–2.0)
Basophils Absolute: 0 10*3/uL (ref 0.0–0.1)
EOS%: 1.4 % (ref 0.0–7.0)
Eosinophils Absolute: 0.1 10*3/uL (ref 0.0–0.5)
HCT: 37.9 % — ABNORMAL LOW (ref 38.4–49.9)
HGB: 12.7 g/dL — ABNORMAL LOW (ref 13.0–17.1)
LYMPH%: 29.1 % (ref 14.0–49.0)
MCH: 31 pg (ref 27.2–33.4)
MCHC: 33.5 g/dL (ref 32.0–36.0)
MCV: 92.4 fL (ref 79.3–98.0)
MONO#: 0.6 10*3/uL (ref 0.1–0.9)
MONO%: 8.8 % (ref 0.0–14.0)
NEUT#: 4 10*3/uL (ref 1.5–6.5)
NEUT%: 60.7 % (ref 39.0–75.0)
Platelets: 203 10*3/uL (ref 140–400)
RBC: 4.1 10*6/uL — AB (ref 4.20–5.82)
RDW: 13.5 % (ref 11.0–14.6)
WBC: 6.6 10*3/uL (ref 4.0–10.3)
lymph#: 1.9 10*3/uL (ref 0.9–3.3)

## 2015-01-27 NOTE — Telephone Encounter (Signed)
per pof to sch pt appt-gave pt copy of avs °

## 2015-01-27 NOTE — Progress Notes (Signed)
Hematology and Oncology Follow Up Visit  [name] [name] [id number] [birth year] 74 y.o. 01/27/2015 3:48 PM [name] [name], [name], [name], MD   Principle Diagnosis: 74 year old gentleman with prostate cancer diagnosed in 2010. He had a Gleason score of 3+4 = 7 and a PSA of 4.4. His disease was in 2 cores and the biopsy. 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   Prior Therapy: He was treated with external beam radiation completed in October 2010. His PSA remain under reasonable control to about 2014 when his PSA rose to 165 and subsequently was up to 202. He is imaging studies including a CT scan of the abdomen and pelvis which showed 07/15/2013 to have a 3.0 x 2.1 cm retroperitoneal adenopathy. A left retrocrural lymph node was measuring 1.1 cm. A bone scan at that time showed a single focus of abnormal uptake in the left posterior fourth rib. No clear CT correlation which could suggest trauma.He was started on Firmagon by Dr. Jeffie [name] in May 2015 and his PSA dropped to 18 in October 2015. His PSA in January 2016 was down to 17.48 and in April 2016 up again to 48.52. Testosterone level was at a castrate level of 16. Staging workup continue to show metastatic bony disease without any major visceral involvement. He developed castration resistant disease with a rising PSA up to 48.  Current therapy:  Androgen deprivation under the care of Dr. Jeffie [name]. Xtandi 160 mg daily started in May 2016.  Interim History:  [name] [name] presents today for a follow-up visit with his wife. Since his last visit, he reports no recent complaints. He reports no new complications related to Xtandi at this time. He does not report GI complaints of nausea or abdominal pain. Does not report lower extremity edema.  He has not reported any increased bony pain or pathological fractures. He reports no issues obtaining this medication and has been shipped to him regularly.  He continues to enjoy  excellent performance status and activity level. And has not reported any recent hospitalization or illnesses. His quality of life has not changed since the last visit.  He does not report any headaches, blurry vision, double vision or syncope. He does not report any fevers, chills, sweats or weight loss. He does not report any chest pain, palpitation, orthopnea or PND. Does not report any cough or shortness of breath. He does not report any nausea, vomiting, abdominal pain, change in his bowel habits. He does not report any urgency, hesitancy. Does not report any hematuria or dysuria. Rest of his review of systems unremarkable.  Medications: I have reviewed the patient's current medications.  Current Outpatient Prescriptions  Medication Sig Dispense Refill  . azelastine (ASTELIN) 0.1 % nasal spray Place 1 spray into both nostrils 2 (two) times daily. Use in each nostril as directed    . ELIQUIS 5 MG TABS tablet Take 1 tablet by mouth two  times daily 180 tablet 3  . enzalutamide (XTANDI) 40 MG capsule Take 4 capsules (160mg ) by  mouth every day 120 capsule 0  . fluticasone (FLONASE) 50 MCG/ACT nasal spray Place 2 sprays into both nostrils daily.     . hydrALAZINE (APRESOLINE) 100 MG tablet Take 100 mg by mouth 2 (two) times daily.     Marland Kitchen latanoprost (XALATAN) 0.005 % ophthalmic solution Place 1 drop into both eyes at bedtime.     Marland Kitchen losartan-hydrochlorothiazide (HYZAAR) 100-12.5 MG per tablet Take 1 tablet by mouth daily.     Marland Kitchen  meclizine (ANTIVERT) 25 MG tablet Take 25 mg by mouth 3 (three) times daily as needed for dizziness.    . metFORMIN (GLUCOPHAGE) 500 MG tablet Take 500 mg by mouth daily with breakfast.     . metoprolol (LOPRESSOR) 50 MG tablet TAKE ONE TABLET BY MOUTH TWICE A DAY 180 tablet 2  . simvastatin (ZOCOR) 20 MG tablet Take 20 mg by mouth daily at 6 PM.      No current facility-administered medications for this visit.     Allergies: No Known Allergies  Past Medical History,  Surgical history, Social history, and Family History were reviewed and updated.   Physical Exam: Blood pressure 141/76, pulse 79, temperature 98.1 F (36.7 C), temperature source Oral, resp. rate 18, height 5\' 11"  (1.803 m), weight 265 lb 11.2 oz (120.521 kg), SpO2 99 %. ECOG: 1 General appearance: alert and cooperative. Not in any distress. Head: Normocephalic, without obvious abnormality no oral thrush. Neck: no adenopathy Lymph nodes: Cervical, supraclavicular, and axillary nodes normal. Heart:regular rate and rhythm, S1, S2 normal, no murmur, click, rub or gallop Lung:chest clear, no wheezing, rales, normal symmetric air entry no dullness to percussion. Abdomin: soft, non-tender, without masses or organomegaly no shifting dullness or ascites. EXT:no erythema, induration, or nodules   Lab Results: Lab Results  Component Value Date   WBC 6.6 01/27/2015   HGB 12.7* 01/27/2015   HCT 37.9* 01/27/2015   MCV 92.4 01/27/2015   PLT 203 01/27/2015     Chemistry      Component Value Date/Time   NA 137 12/18/2014 1137   K 4.3 12/18/2014 1137   CO2 24 12/18/2014 1137   BUN 17.4 12/18/2014 1137   CREATININE 1.0 12/18/2014 1137   CREATININE 1.30 06/03/2014 1331      Component Value Date/Time   CALCIUM 9.8 12/18/2014 1137   ALKPHOS 71 12/18/2014 1137   AST 15 12/18/2014 1137   ALT 16 12/18/2014 1137   BILITOT 0.57 12/18/2014 1137     Results for [name] [name], [name] [name] (MRN [id number]) as of 01/27/2015 15:36  Ref. Range 10/08/2014 08:55 11/11/2014 11:16 12/18/2014 11:37  PSA Latest Ref Range: <=4.00 ng/mL 5.82 (H) 5.10 (H) 5.01 (H)        Impression and Plan:   Assessment and Plan:   74 year old gentleman with:  1. Prostate cancer diagnosed in 2010. He had a Gleason score of 3+4 = 7 and a PSA of 4.4. His disease was in 2 cores and the biopsy. He received external beam radiation as a definitive modality. He developed recurrent disease with retroperitoneal adenopathy and a bony  metastasis. His PSA was up to 202. He was treated with androgen deprivation and PSA was down to 18. He subsequently developed castration resistant disease with PSA up to 40. Staging workup including bone scan and CT scan showed multiple area of increased bony uptake in the bone scan. His CT scan did not show any visceral metastasis.  He is currently on Xtandi 160 mg daily started in May 2016 and have tolerated it well. His PSA have been relatively stable last few months after dramatic decline from 48. The plan is to continue the same dose and schedule.  2. Androgen depravation therapy: I have recommended continuing for the time being. No delayed complications related to it.  3. Bone directed therapy: He is a good candidate for it and this will be considered in the future. They'll be determined after his repeat a bone scan depending on the volume of disease.  4. Atrial fibrillation: He is currently on Eliquis. No bleeding complications noted since the last visit.   5. Follow-up: Will be in 4 to 5  weeks.  Zola Button, MD 12/13/20163:48 PM

## 2015-01-28 ENCOUNTER — Telehealth: Payer: Self-pay | Admitting: *Deleted

## 2015-01-28 LAB — PSA: PSA: 3.11 ng/mL (ref <=4.00)

## 2015-01-28 NOTE — Telephone Encounter (Signed)
-----   Message from [name], MD sent at 01/28/2015  8:19 AM EST ----- Please let him know his PSA

## 2015-01-28 NOTE — Telephone Encounter (Signed)
As noted below by Dr. Shadad, I informed patient of his PSA level. Patient verbalized understanding. 

## 2015-02-10 ENCOUNTER — Other Ambulatory Visit: Payer: Self-pay | Admitting: *Deleted

## 2015-02-10 DIAGNOSIS — C61 Malignant neoplasm of prostate: Secondary | ICD-10-CM

## 2015-02-10 MED ORDER — ENZALUTAMIDE 40 MG PO CAPS: ORAL_CAPSULE | ORAL | Status: DC

## 2015-03-04 ENCOUNTER — Other Ambulatory Visit: Payer: Self-pay | Admitting: Cardiovascular Disease

## 2015-03-04 ENCOUNTER — Ambulatory Visit: Payer: Medicare Other | Admitting: Oncology

## 2015-03-04 ENCOUNTER — Other Ambulatory Visit: Payer: Medicare Other

## 2015-03-05 ENCOUNTER — Ambulatory Visit (HOSPITAL_BASED_OUTPATIENT_CLINIC_OR_DEPARTMENT_OTHER): Payer: Medicare Other | Admitting: Oncology

## 2015-03-05 ENCOUNTER — Telehealth: Payer: Self-pay | Admitting: Oncology

## 2015-03-05 ENCOUNTER — Other Ambulatory Visit (HOSPITAL_BASED_OUTPATIENT_CLINIC_OR_DEPARTMENT_OTHER): Payer: Medicare Other

## 2015-03-05 VITALS — BP 133/91 | HR 74 | Temp 98.1°F | Resp 18 | Ht 71.0 in | Wt 264.4 lb

## 2015-03-05 DIAGNOSIS — C7951 Secondary malignant neoplasm of bone: Secondary | ICD-10-CM

## 2015-03-05 DIAGNOSIS — E291 Testicular hypofunction: Secondary | ICD-10-CM

## 2015-03-05 DIAGNOSIS — C61 Malignant neoplasm of prostate: Secondary | ICD-10-CM

## 2015-03-05 DIAGNOSIS — I4891 Unspecified atrial fibrillation: Secondary | ICD-10-CM | POA: Diagnosis not present

## 2015-03-05 LAB — CBC WITH DIFFERENTIAL/PLATELET
BASO%: 0.4 % (ref 0.0–2.0)
BASOS ABS: 0 10*3/uL (ref 0.0–0.1)
EOS ABS: 0.1 10*3/uL (ref 0.0–0.5)
EOS%: 1.3 % (ref 0.0–7.0)
HCT: 38.5 % (ref 38.4–49.9)
HEMOGLOBIN: 12.8 g/dL — AB (ref 13.0–17.1)
LYMPH%: 20.2 % (ref 14.0–49.0)
MCH: 30.6 pg (ref 27.2–33.4)
MCHC: 33.4 g/dL (ref 32.0–36.0)
MCV: 91.8 fL (ref 79.3–98.0)
MONO#: 0.6 10*3/uL (ref 0.1–0.9)
MONO%: 9.2 % (ref 0.0–14.0)
NEUT%: 68.9 % (ref 39.0–75.0)
NEUTROS ABS: 4.2 10*3/uL (ref 1.5–6.5)
PLATELETS: 209 10*3/uL (ref 140–400)
RBC: 4.2 10*6/uL (ref 4.20–5.82)
RDW: 13.9 % (ref 11.0–14.6)
WBC: 6.1 10*3/uL (ref 4.0–10.3)
lymph#: 1.2 10*3/uL (ref 0.9–3.3)

## 2015-03-05 LAB — COMPREHENSIVE METABOLIC PANEL
ALBUMIN: 3.8 g/dL (ref 3.5–5.0)
ALK PHOS: 69 U/L (ref 40–150)
ALT: 15 U/L (ref 0–55)
ANION GAP: 8 meq/L (ref 3–11)
AST: 17 U/L (ref 5–34)
BILIRUBIN TOTAL: 0.65 mg/dL (ref 0.20–1.20)
BUN: 19.3 mg/dL (ref 7.0–26.0)
CO2: 27 mEq/L (ref 22–29)
Calcium: 9.4 mg/dL (ref 8.4–10.4)
Chloride: 103 mEq/L (ref 98–109)
Creatinine: 1.1 mg/dL (ref 0.7–1.3)
EGFR: 65 mL/min/{1.73_m2} — AB (ref >90)
Glucose: 85 mg/dl (ref 70–140)
POTASSIUM: 4 meq/L (ref 3.5–5.1)
Sodium: 138 mEq/L (ref 136–145)
TOTAL PROTEIN: 7.2 g/dL (ref 6.4–8.3)

## 2015-03-05 NOTE — Progress Notes (Signed)
Hematology and Oncology Follow Up Visit  [name] [name] [id number] 31-Oct-1940 75 y.o. 03/05/2015 9:49 AM [name] [name], [name], [name], MD   Principle Diagnosis: 75 year old gentleman with prostate cancer diagnosed in 2010. He had a Gleason score of 3+4 = 7 and a PSA of 4.4. His disease was in 2 cores and the biopsy. 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   Prior Therapy: He was treated with external beam radiation completed in October 2010. His PSA remain under reasonable control to about 2014 when his PSA rose to 165 and subsequently was up to 202. He is imaging studies including a CT scan of the abdomen and pelvis which showed 07/15/2013 to have a 3.0 x 2.1 cm retroperitoneal adenopathy. A left retrocrural lymph node was measuring 1.1 cm. A bone scan at that time showed a single focus of abnormal uptake in the left posterior fourth rib. No clear CT correlation which could suggest trauma.He was started on Firmagon by Dr. Jeffie [name] in May 2015 and his PSA dropped to 18 in October 2015. His PSA in January 2016 was down to 17.48 and in April 2016 up again to 48.52. Testosterone level was at a castrate level of 16. Staging workup continue to show metastatic bony disease without any major visceral involvement. He developed castration resistant disease with a rising PSA up to 48.  Current therapy:  Androgen deprivation under the care of Dr. Jeffie [name]. Xtandi 160 mg daily started in May 2016.  Interim History:  [name] [name] presents today for a follow-up visit with his wife. Since his last visit, he continues to do very well and remains in reasonably good health. He continues to work regularly and did not report any decline in energy or performance status.He reports no new complications related to Xtandi at this time. He does not report GI complaints of nausea or abdominal pain. Does not report lower extremity edema.  He has not reported any increased bony pain or pathological  fractures. He denied any syncope or seizures. He denied any recent hospitalization or illnesses.  He denied any bleeding complications such as epistaxis, hematochezia or melena. He continues to be on Eliquis for long-term anticoagulation.  He does not report any headaches, blurry vision, double vision or alteration of mental status.Marland Kitchen He does not report any fevers, chills, sweats or weight loss. He does not report any chest pain, palpitation, orthopnea or PND. Does not report any cough or shortness of breath. He does not report any nausea, vomiting, abdominal pain, change in his bowel habits. He does not report any urgency, hesitancy. Does not report any hematuria or dysuria. Rest of his review of systems unremarkable.  Medications: I have reviewed the patient's current medications.  Current Outpatient Prescriptions  Medication Sig Dispense Refill  . azelastine (ASTELIN) 0.1 % nasal spray Place 1 spray into both nostrils 2 (two) times daily. Use in each nostril as directed    . ELIQUIS 5 MG TABS tablet Take 1 tablet by mouth two  times daily 180 tablet 3  . enzalutamide (XTANDI) 40 MG capsule Take 4 capsules (160mg ) by  mouth every day 120 capsule 0  . fluticasone (FLONASE) 50 MCG/ACT nasal spray Place 2 sprays into both nostrils daily.     . hydrALAZINE (APRESOLINE) 100 MG tablet Take 100 mg by mouth 2 (two) times daily.     Marland Kitchen latanoprost (XALATAN) 0.005 % ophthalmic solution Place 1 drop into both eyes at bedtime.     Marland Kitchen losartan-hydrochlorothiazide (HYZAAR)  100-12.5 MG per tablet Take 1 tablet by mouth daily.     . meclizine (ANTIVERT) 25 MG tablet Take 25 mg by mouth 3 (three) times daily as needed for dizziness.    . metFORMIN (GLUCOPHAGE) 500 MG tablet Take 500 mg by mouth daily with breakfast.     . metoprolol (LOPRESSOR) 50 MG tablet TAKE ONE TABLET BY MOUTH TWICE A DAY 180 tablet 3  . simvastatin (ZOCOR) 20 MG tablet Take 20 mg by mouth daily at 6 PM.      No current facility-administered  medications for this visit.     Allergies: No Known Allergies  Past Medical History, Surgical history, Social history, and Family History were reviewed and updated.   Physical Exam: Blood pressure 133/91, pulse 74, temperature 98.1 F (36.7 C), temperature source Oral, resp. rate 18, height 5\' 11"  (1.803 m), weight 264 lb 6.4 oz (119.931 kg), SpO2 100 %. ECOG: 1 General appearance: alert and cooperative. Appeared without distress. Head: Normocephalic, without obvious abnormality no oral thrush. Neck: no adenopathy Lymph nodes: Cervical, supraclavicular, and axillary nodes normal. Heart:regular rate and rhythm, S1, S2 normal, no murmur, click, rub or gallop Lung:chest clear, no wheezing, rales, normal symmetric air entry no dullness to percussion. Abdomin: soft, non-tender, without masses or organomegaly no rebound or guarding. EXT:no erythema, induration, or nodules   Lab Results: Lab Results  Component Value Date   WBC 6.1 03/05/2015   HGB 12.8* 03/05/2015   HCT 38.5 03/05/2015   MCV 91.8 03/05/2015   PLT 209 03/05/2015     Chemistry      Component Value Date/Time   NA 138 01/27/2015 1719   NA 137 12/18/2014 1137   K 4.0 01/27/2015 1719   K 4.3 12/18/2014 1137   CL 101 01/27/2015 1719   CO2 27 01/27/2015 1719   CO2 24 12/18/2014 1137   BUN 21* 01/27/2015 1719   BUN 17.4 12/18/2014 1137   CREATININE 1.09 01/27/2015 1719   CREATININE 1.0 12/18/2014 1137      Component Value Date/Time   CALCIUM 9.5 01/27/2015 1719   CALCIUM 9.8 12/18/2014 1137   ALKPHOS 74 01/27/2015 1719   ALKPHOS 71 12/18/2014 1137   AST 21 01/27/2015 1719   AST 15 12/18/2014 1137   ALT 19 01/27/2015 1719   ALT 16 12/18/2014 1137   BILITOT 0.7 01/27/2015 1719   BILITOT 0.57 12/18/2014 1137        Results for [name] [name], [name] [name] (MRN [id number]) as of 03/05/2015 09:28  Ref. Range 12/18/2014 11:37 01/27/2015 14:55  PSA Latest Ref Range: <=4.00 ng/mL 5.01 (H) 3.11      Impression and  Plan:   Assessment and Plan:   75 year old gentleman with:  1. Prostate cancer diagnosed in 2010. He had a Gleason score of 3+4 = 7 and a PSA of 4.4. His disease was in 2 cores and the biopsy. He received external beam radiation as a definitive modality. He developed recurrent disease with retroperitoneal adenopathy and a bony metastasis. His PSA was up to 202. He was treated with androgen deprivation and PSA was down to 18. He subsequently developed castration resistant disease with PSA up to 51. Staging workup including bone scan and CT scan showed multiple area of increased bony uptake in the bone scan. His CT scan did not show any visceral metastasis.  He is currently on Xtandi 160 mg daily started in May 2016 and have tolerated it well. His PSA have declined rapidly since May 2016 from close  to 70 down to currently at 3.11. Given his excellent tolerance to this medication and excellent response with PSA criteria, we have elected to continue on the same dose and schedule as long as he is responding.  2. Androgen depravation therapy: I have recommended continuing for the time being. I have recommended continuing this indefinitely.  3. Bone directed therapy: He is a good candidate for it and this will be considered in the future. Develops progression of disease with high-volume bony metastasis there'll be a consideration.  4. Atrial fibrillation: He is currently on Eliquis. No bleeding complications noted since the last visit.   5. Follow-up: Will be in 4 to 5  weeks.  [facility], MD 1/19/20179:49 AM

## 2015-03-05 NOTE — Telephone Encounter (Signed)
per pof to sch pt apt-gave pt copy of avs

## 2015-03-06 ENCOUNTER — Telehealth: Payer: Self-pay | Admitting: *Deleted

## 2015-03-06 LAB — PSA (PARALLEL TESTING): PSA: 2.67 ng/mL (ref <=4.00)

## 2015-03-06 LAB — PSA: PROSTATE SPECIFIC AG, SERUM: 2.7 ng/mL (ref 0.0–4.0)

## 2015-03-06 NOTE — Telephone Encounter (Signed)
Spoke with patient. Gave results of last PSA 

## 2015-03-06 NOTE — Telephone Encounter (Signed)
-----   Message from [name], MD sent at 03/06/2015  8:14 AM EST ----- PSA down again. Please let him know.

## 2015-03-09 ENCOUNTER — Ambulatory Visit (INDEPENDENT_AMBULATORY_CARE_PROVIDER_SITE_OTHER): Payer: Medicare Other | Admitting: Cardiovascular Disease

## 2015-03-09 ENCOUNTER — Encounter: Payer: Self-pay | Admitting: Cardiovascular Disease

## 2015-03-09 VITALS — BP 128/90 | HR 80 | Ht 71.0 in | Wt 262.1 lb

## 2015-03-09 DIAGNOSIS — I10 Essential (primary) hypertension: Secondary | ICD-10-CM

## 2015-03-09 DIAGNOSIS — I482 Chronic atrial fibrillation, unspecified: Secondary | ICD-10-CM

## 2015-03-09 NOTE — Patient Instructions (Signed)
Medication Instructions:  The current medical regimen is effective;  continue present plan and medications.  Follow-Up: Follow up in 1 year with Dr. Nahser.  You will receive a letter in the mail 2 months before you are due.  Please call us when you receive this letter to schedule your follow up appointment.  If you need a refill on your cardiac medications before your next appointment, please call your pharmacy.  Thank you for choosing [facility]!!     

## 2015-03-09 NOTE — Progress Notes (Signed)
[name] [name] Date of Birth  Mar 29, 1940       [facility] [address], [place], [place]  [address]   [place], [place]  [address] [place]   Fax  [phone]     Fax [phone]  Problem List: 1. Atrial fibrillation 2. Hypertension 3. Diabetes mellitus 4. Hyperlipidemia  History of Present Illness:  [name] [name] presents today for newly discovered atrial fibrillation. He was sent to our office for a urology EKG. He was found to  have atrial fibrillation and was worked into my schedule for further evaluation.  His Primary medical doctor is Dalia Heading, MD ( [place] ).  He does to see his medical doctor on a regular basis and has never been told about atrial fib.   He has no symptoms of atrial fibrillation. Cannot tell that his heartbeat is irregular. He exercises-walks 2 miles every day. He may have a little shortness breath when he walks up hills but this is very temporary.  He denies any chest pain, syncope, presyncope. He denies any PND or orthopnea.  CHADS VASC2  Score is 3 which corresponds to a high risk of stroke in atrial fibrillation.  Yearly risk of stroke without warfarin treatment is estimated at 3.2.    August 26, 2013:  [place] is doing well - walking 2 miles a day most days of the week.   Occasionally can feel that his heart is irregular but is not .  Jan. 13, 2016:  [name] [name] is a 75 yo with hx of chronic atrial flutter ablation, diabetes mellitus, and hypertension. He has gained some weight since his last visit BP is a bit elevated.  BP is always better at home.  No CP  , dyspnea. Still walking 2 miles a day.  September 01, 2014: [name] [name] is doing well.  Walking every day .  No CP   Jan. 23, 2017:  doing well from a cardiac standpoint.  PSA has been going down.   Still very active, no CP or dyspena.  We have not attempted a cardioversion and he does not want to  have one at this point . He is completely asymptomatic.    Current Outpatient Prescriptions  Medication Sig Dispense Refill  . azelastine (ASTELIN) 0.1 % nasal spray Place 1 spray into both nostrils 2 (two) times daily. Use in each nostril as directed    . ELIQUIS 5 MG TABS tablet Take 1 tablet by mouth two  times daily 180 tablet 3  . enzalutamide (XTANDI) 40 MG capsule Take 4 capsules (160mg ) by  mouth every day 120 capsule 0  . fluticasone (FLONASE) 50 MCG/ACT nasal spray Place 2 sprays into both nostrils daily.     . hydrALAZINE (APRESOLINE) 100 MG tablet Take 100 mg by mouth 2 (two) times daily.     Marland Kitchen latanoprost (XALATAN) 0.005 % ophthalmic solution Place 1 drop into both eyes at bedtime.     Marland Kitchen losartan-hydrochlorothiazide (HYZAAR) 100-12.5 MG per tablet Take 1 tablet by mouth daily.     . meclizine (ANTIVERT) 25 MG tablet Take 25 mg by mouth 3 (three) times daily as needed for dizziness.    . metFORMIN (GLUCOPHAGE) 500 MG tablet Take 500 mg by mouth daily with breakfast.     . metoprolol (LOPRESSOR) 50 MG tablet TAKE ONE TABLET BY MOUTH TWICE A DAY 180 tablet 3  .  simvastatin (ZOCOR) 20 MG tablet Take 20 mg by mouth daily at 6 PM.      No current facility-administered medications for this visit.     No Known Allergies  Past Medical History  Diagnosis Date  . Prostate cancer ([place])   . Diabetes mellitus without complication ([place])   . Hypertension     No past surgical history on file.  History  Smoking status  . Never Smoker   Smokeless tobacco  . Never Used    History  Alcohol Use No    Comment: drinks occasional beer    Family History  Problem Relation Age of Onset  . Colon cancer Sister     Reviw of Systems:  Reviewed in the HPI.  All other systems are negative.  Physical Exam: Blood pressure 128/90, pulse 80, height 5\' 11"  (1.803 m), weight 262 lb 1.9 oz (118.897 kg). Wt Readings from Last 3 Encounters:  03/09/15 262 lb 1.9 oz (118.897 kg)  03/05/15 264  lb 6.4 oz (119.931 kg)  01/27/15 265 lb 11.2 oz (120.521 kg)     General: Well developed, well nourished, in no acute distress.  Head: Normocephalic, atraumatic, sclera non-icteric, mucus membranes are moist,   Neck: Supple. Carotids are 2 + without bruits. No JVD   Lungs: Clear   Heart: irreg. Irreg. normla [id number], soft systlic murmur  Abdomen: Soft, non-tender, non-distended with normal bowel sounds.  Msk:  Strength and tone are normal   Extremities: No clubbing or cyanosis. No edema.  Distal pedal pulses are 2+ and equal   Neuro: CN II - XII intact.  Alert and oriented X 3.   Psych:  Normal   ECG: September 01, 2014: Atrial fib with rate of 78.  ,  LVH with repol .    Assessment / Plan:   1. Atrial fibrillation- he remains in atrial fibrillation. He's completely asymptomatic. His left ventricular systolic function is normal. We'll continue with his same medications. At this point I do not think that we need to consider cardioversion because of his lack of symptoms.  I'll see him in 12  months for follow-up visit.  2. Hypertension - his blood pressures well-controlled.  3. Diabetes mellitus  4. Hyperlipidemia - followed by his general medical doctor.     [name] [name], Wonda Cheng, MD  03/09/2015 11:00 AM    [place],  [place], [place]  [address] Pager [phone] Phone: [phone]; Fax: [phone]   [facility]  [address] [place],   [id number] [phone]    Fax [phone]

## 2015-03-13 ENCOUNTER — Ambulatory Visit (INDEPENDENT_AMBULATORY_CARE_PROVIDER_SITE_OTHER): Payer: Medicare Other | Admitting: Urology

## 2015-03-13 DIAGNOSIS — N3941 Urge incontinence: Secondary | ICD-10-CM

## 2015-03-13 DIAGNOSIS — C61 Malignant neoplasm of prostate: Secondary | ICD-10-CM

## 2015-03-13 DIAGNOSIS — N3040 Irradiation cystitis without hematuria: Secondary | ICD-10-CM | POA: Diagnosis not present

## 2015-03-16 ENCOUNTER — Encounter: Payer: Self-pay | Admitting: *Deleted

## 2015-03-20 ENCOUNTER — Other Ambulatory Visit: Payer: Self-pay | Admitting: *Deleted

## 2015-03-20 DIAGNOSIS — C61 Malignant neoplasm of prostate: Secondary | ICD-10-CM

## 2015-03-20 MED ORDER — ENZALUTAMIDE 40 MG PO CAPS
ORAL_CAPSULE | ORAL | Status: DC
Start: 2015-03-20 — End: 2015-04-07

## 2015-04-07 ENCOUNTER — Other Ambulatory Visit: Payer: Self-pay | Admitting: *Deleted

## 2015-04-07 DIAGNOSIS — C61 Malignant neoplasm of prostate: Secondary | ICD-10-CM

## 2015-04-07 MED ORDER — ENZALUTAMIDE 40 MG PO CAPS: ORAL_CAPSULE | ORAL | Status: DC

## 2015-04-17 ENCOUNTER — Ambulatory Visit (HOSPITAL_BASED_OUTPATIENT_CLINIC_OR_DEPARTMENT_OTHER): Payer: Medicare Other | Admitting: Oncology

## 2015-04-17 ENCOUNTER — Telehealth: Payer: Self-pay | Admitting: Oncology

## 2015-04-17 ENCOUNTER — Other Ambulatory Visit (HOSPITAL_BASED_OUTPATIENT_CLINIC_OR_DEPARTMENT_OTHER): Payer: Medicare Other

## 2015-04-17 ENCOUNTER — Other Ambulatory Visit: Payer: Self-pay | Admitting: *Deleted

## 2015-04-17 VITALS — BP 129/84 | HR 83 | Temp 97.8°F | Resp 17 | Ht 71.0 in | Wt 264.0 lb

## 2015-04-17 DIAGNOSIS — C61 Malignant neoplasm of prostate: Secondary | ICD-10-CM

## 2015-04-17 DIAGNOSIS — E291 Testicular hypofunction: Secondary | ICD-10-CM | POA: Diagnosis not present

## 2015-04-17 DIAGNOSIS — C7951 Secondary malignant neoplasm of bone: Secondary | ICD-10-CM | POA: Diagnosis not present

## 2015-04-17 DIAGNOSIS — I4891 Unspecified atrial fibrillation: Secondary | ICD-10-CM

## 2015-04-17 LAB — CBC WITH DIFFERENTIAL/PLATELET
BASO%: 0.5 % (ref 0.0–2.0)
Basophils Absolute: 0 10*3/uL (ref 0.0–0.1)
EOS%: 1.2 % (ref 0.0–7.0)
Eosinophils Absolute: 0.1 10*3/uL (ref 0.0–0.5)
HCT: 38.2 % — ABNORMAL LOW (ref 38.4–49.9)
HGB: 12.5 g/dL — ABNORMAL LOW (ref 13.0–17.1)
LYMPH%: 22.3 % (ref 14.0–49.0)
MCH: 30.3 pg (ref 27.2–33.4)
MCHC: 32.8 g/dL (ref 32.0–36.0)
MCV: 92.3 fL (ref 79.3–98.0)
MONO#: 0.7 10*3/uL (ref 0.1–0.9)
MONO%: 11 % (ref 0.0–14.0)
NEUT#: 4.3 10*3/uL (ref 1.5–6.5)
NEUT%: 65 % (ref 39.0–75.0)
PLATELETS: 215 10*3/uL (ref 140–400)
RBC: 4.14 10*6/uL — ABNORMAL LOW (ref 4.20–5.82)
RDW: 13.7 % (ref 11.0–14.6)
WBC: 6.6 10*3/uL (ref 4.0–10.3)
lymph#: 1.5 10*3/uL (ref 0.9–3.3)

## 2015-04-17 LAB — COMPREHENSIVE METABOLIC PANEL
ALBUMIN: 3.6 g/dL (ref 3.5–5.0)
ALT: 16 U/L (ref 0–55)
AST: 15 U/L (ref 5–34)
Alkaline Phosphatase: 72 U/L (ref 40–150)
Anion Gap: 10 mEq/L (ref 3–11)
BUN: 18.6 mg/dL (ref 7.0–26.0)
CALCIUM: 9.5 mg/dL (ref 8.4–10.4)
CHLORIDE: 105 meq/L (ref 98–109)
CO2: 24 mEq/L (ref 22–29)
CREATININE: 1 mg/dL (ref 0.7–1.3)
EGFR: 70 mL/min/{1.73_m2} — ABNORMAL LOW (ref >90)
GLUCOSE: 97 mg/dL (ref 70–140)
POTASSIUM: 4.1 meq/L (ref 3.5–5.1)
SODIUM: 139 meq/L (ref 136–145)
Total Bilirubin: 0.66 mg/dL (ref 0.20–1.20)
Total Protein: 7.1 g/dL (ref 6.4–8.3)

## 2015-04-17 MED ORDER — ENZALUTAMIDE 40 MG PO CAPS
ORAL_CAPSULE | ORAL | Status: DC
Start: 2015-04-17 — End: 2015-05-18

## 2015-04-17 NOTE — Progress Notes (Signed)
Hematology and Oncology Follow Up Visit  [name] [id number] 1940/08/15 75 y.o. 04/17/2015 10:46 AM [name], [name], [name], MD   Principle Diagnosis: 75 year old gentleman with prostate cancer diagnosed in 2010. He had a Gleason score of 3+4 = 7 and a PSA of 4.4. His disease was in 2 cores and the biopsy. 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   Prior Therapy: He was treated with external beam radiation completed in October 2010. His PSA remain under reasonable control to about 2014 when his PSA rose to 165 and subsequently was up to 202. He is imaging studies including a CT scan of the abdomen and pelvis which showed 07/15/2013 to have a 3.0 x 2.1 cm retroperitoneal adenopathy. A left retrocrural lymph node was measuring 1.1 cm. A bone scan at that time showed a single focus of abnormal uptake in the left posterior fourth rib. No clear CT correlation which could suggest trauma.He was started on Firmagon by Dr. Jeffie Pollock in May 2015 and his PSA dropped to 18 in October 2015. His PSA in January 2016 was down to 17.48 and in April 2016 up again to 48.52. Testosterone level was at a castrate level of 16. Staging workup continue to show metastatic bony disease without any major visceral involvement. He developed castration resistant disease with a rising PSA up to 48.  Current therapy:  Androgen deprivation under the care of Dr. Jeffie Pollock. Xtandi 160 mg daily started in May 2016.  Interim History:  Mr. [name] [name] presents today for a follow-up visit with his wife. Since his last visit, he reports no recent complaints. He reports no side effects related to Xtandi at this time. He does not report GI complaints of nausea or abdominal pain. Does not report lower extremity edema.  He has not reported any increased bony pain or pathological fractures. He continues to workout regularly and did not report any decline in energy or performance status.he walks about 2 miles  multiple times per week. His appetite remained excellent.   He continues to be on Eliquis for long-term anticoagulation.He denied any bleeding complications such as epistaxis, hematochezia or melena.  He does not report any headaches, blurry vision, double vision or alteration of mental status or seizures. He does not report any fevers, chills, sweats or weight loss. He does not report any chest pain, palpitation, orthopnea or PND. Does not report any cough or shortness of breath. He does not report any nausea, vomiting, abdominal pain, change in his bowel habits. He does not report any urgency, hesitancy. Does not report any hematuria or dysuria. Rest of his review of systems unremarkable.  Medications: I have reviewed the patient's current medications.  Current Outpatient Prescriptions  Medication Sig Dispense Refill  . azelastine (ASTELIN) 0.1 % nasal spray Place 1 spray into both nostrils 2 (two) times daily. Use in each nostril as directed    . ELIQUIS 5 MG TABS tablet Take 1 tablet by mouth two  times daily 180 tablet 3  . enzalutamide (XTANDI) 40 MG capsule Take 4 capsules (160mg ) by  mouth every day 120 capsule 0  . fluticasone (FLONASE) 50 MCG/ACT nasal spray Place 2 sprays into both nostrils daily.     . hydrALAZINE (APRESOLINE) 100 MG tablet Take 100 mg by mouth 2 (two) times daily.     Marland Kitchen latanoprost (XALATAN) 0.005 % ophthalmic solution Place 1 drop into both eyes at bedtime.     Marland Kitchen losartan-hydrochlorothiazide (HYZAAR) 100-12.5 MG per tablet Take  1 tablet by mouth daily.     . meclizine (ANTIVERT) 25 MG tablet Take 25 mg by mouth 3 (three) times daily as needed for dizziness.    . metFORMIN (GLUCOPHAGE) 500 MG tablet Take 500 mg by mouth daily with breakfast.     . metoprolol (LOPRESSOR) 50 MG tablet TAKE ONE TABLET BY MOUTH TWICE A DAY 180 tablet 3  . simvastatin (ZOCOR) 20 MG tablet Take 20 mg by mouth daily at 6 PM.      No current facility-administered medications for this visit.      Allergies: No Known Allergies  Past Medical History, Surgical history, Social history, and Family History were reviewed and updated.   Physical Exam: Blood pressure 129/84, pulse 83, temperature 97.8 F (36.6 C), temperature source Oral, resp. rate 17, height 5\' 11"  (1.803 m), weight 264 lb (119.75 kg), SpO2 98 %. ECOG: 1 General appearance: alert and cooperative. Healthy-appearing gentleman without distress. Head: Normocephalic, without obvious abnormality no oral ulcers or lesions. Neck: no adenopathy Lymph nodes: Cervical, supraclavicular, and axillary nodes normal. Heart:regular rate and rhythm, S1, S2 normal, no murmur, click, rub or gallop Lung:chest clear, no wheezing, rales, normal symmetric air entry no dullness to percussion. Abdomin: soft, non-tender, without masses or organomegaly no shifting dullness or ascites. EXT:no erythema, induration, or nodules   Lab Results: Lab Results  Component Value Date   WBC 6.6 04/17/2015   HGB 12.5* 04/17/2015   HCT 38.2* 04/17/2015   MCV 92.3 04/17/2015   PLT 215 04/17/2015     Chemistry      Component Value Date/Time   NA 138 03/05/2015 0919   NA 138 01/27/2015 1719   K 4.0 03/05/2015 0919   K 4.0 01/27/2015 1719   CL 101 01/27/2015 1719   CO2 27 03/05/2015 0919   CO2 27 01/27/2015 1719   BUN 19.3 03/05/2015 0919   BUN 21* 01/27/2015 1719   CREATININE 1.1 03/05/2015 0919   CREATININE 1.09 01/27/2015 1719      Component Value Date/Time   CALCIUM 9.4 03/05/2015 0919   CALCIUM 9.5 01/27/2015 1719   ALKPHOS 69 03/05/2015 0919   ALKPHOS 74 01/27/2015 1719   AST 17 03/05/2015 0919   AST 21 01/27/2015 1719   ALT 15 03/05/2015 0919   ALT 19 01/27/2015 1719   BILITOT 0.65 03/05/2015 0919   BILITOT 0.7 01/27/2015 1719        Results for SKAI, FARAR (MRN [id number]) as of 04/17/2015 10:12  Ref. Range 12/18/2014 11:37 01/27/2015 14:55 03/05/2015 09:19  PSA Latest Ref Range: <=4.00 ng/mL 5.01 (H) 3.11 2.67       Impression and Plan:   Assessment and Plan:   75 year old gentleman with:  1. Prostate cancer diagnosed in 2010. He had a Gleason score of 3+4 = 7 and a PSA of 4.4. His disease was in 2 cores and the biopsy. He received external beam radiation as a definitive modality. He developed recurrent disease with retroperitoneal adenopathy and a bony metastasis. His PSA was up to 202. He was treated with androgen deprivation and PSA was down to 18. He subsequently developed castration resistant disease with PSA up to 71. Staging workup including bone scan and CT scan showed multiple area of increased bony uptake in the bone scan. His CT scan did not show any visceral metastasis.  He is currently on Xtandi 160 mg daily started in May 2016 and have tolerated it well. His PSA continues to respond with a PSA drop from  48 and currently at 2.67. He reports no objections to continuing on the current dose and schedule. Different salvage regimen will be used upon symptomatic progression.  2. Androgen depravation therapy: I have recommended continuing for the time being. I have recommended continuing this indefinitely. He reports no complications related to that.  3. Bone directed therapy: This was addressed today and will be deferred he develops progressive bony disease.  4. Atrial fibrillation: He is currently on Eliquis. No , dictation related to this medication including bleeding.  5. Follow-up: Will be in 4 to 5  weeks.  [facility], MD 3/3/201710:46 AM

## 2015-04-17 NOTE — Telephone Encounter (Signed)
Gave patient avs report and appointments for April. Date per patient cannot come date/time requested per 3/3 pof.

## 2015-04-18 LAB — PSA: Prostate Specific Ag, Serum: 2.1 ng/mL (ref 0.0–4.0)

## 2015-04-18 LAB — PSA (PARALLEL TESTING): PSA: 2.11 ng/mL (ref <=4.00)

## 2015-04-20 ENCOUNTER — Telehealth: Payer: Self-pay | Admitting: *Deleted

## 2015-04-20 NOTE — Telephone Encounter (Signed)
-----   Message from [name], MD sent at 04/20/2015  9:19 AM EST ----- Please let him know his PSA is down again.

## 2015-04-20 NOTE — Telephone Encounter (Signed)
Spoke with patient, gave results of last PSA 

## 2015-05-18 ENCOUNTER — Other Ambulatory Visit: Payer: Self-pay | Admitting: *Deleted

## 2015-05-18 DIAGNOSIS — C61 Malignant neoplasm of prostate: Secondary | ICD-10-CM

## 2015-05-18 MED ORDER — ENZALUTAMIDE 40 MG PO CAPS: ORAL_CAPSULE | ORAL | Status: DC

## 2015-05-19 ENCOUNTER — Telehealth: Payer: Self-pay | Admitting: Oncology

## 2015-05-19 ENCOUNTER — Ambulatory Visit (HOSPITAL_BASED_OUTPATIENT_CLINIC_OR_DEPARTMENT_OTHER): Payer: Medicare Other | Admitting: Oncology

## 2015-05-19 ENCOUNTER — Other Ambulatory Visit (HOSPITAL_BASED_OUTPATIENT_CLINIC_OR_DEPARTMENT_OTHER): Payer: Medicare Other

## 2015-05-19 VITALS — BP 153/96 | HR 103 | Temp 98.2°F | Resp 18 | Ht 71.0 in | Wt 265.5 lb

## 2015-05-19 DIAGNOSIS — C61 Malignant neoplasm of prostate: Secondary | ICD-10-CM

## 2015-05-19 DIAGNOSIS — I4891 Unspecified atrial fibrillation: Secondary | ICD-10-CM

## 2015-05-19 DIAGNOSIS — E291 Testicular hypofunction: Secondary | ICD-10-CM

## 2015-05-19 DIAGNOSIS — C7951 Secondary malignant neoplasm of bone: Secondary | ICD-10-CM | POA: Diagnosis not present

## 2015-05-19 LAB — COMPREHENSIVE METABOLIC PANEL
ALBUMIN: 3.8 g/dL (ref 3.5–5.0)
ALT: 15 U/L (ref 0–55)
ANION GAP: 8 meq/L (ref 3–11)
AST: 17 U/L (ref 5–34)
Alkaline Phosphatase: 66 U/L (ref 40–150)
BILIRUBIN TOTAL: 0.61 mg/dL (ref 0.20–1.20)
BUN: 18.3 mg/dL (ref 7.0–26.0)
CALCIUM: 9.7 mg/dL (ref 8.4–10.4)
CO2: 28 mEq/L (ref 22–29)
CREATININE: 1.2 mg/dL (ref 0.7–1.3)
Chloride: 103 mEq/L (ref 98–109)
EGFR: 59 mL/min/{1.73_m2} — ABNORMAL LOW (ref >90)
Glucose: 107 mg/dl (ref 70–140)
Potassium: 4.5 mEq/L (ref 3.5–5.1)
Sodium: 139 mEq/L (ref 136–145)
TOTAL PROTEIN: 7.3 g/dL (ref 6.4–8.3)

## 2015-05-19 LAB — CBC WITH DIFFERENTIAL/PLATELET
BASO%: 0.3 % (ref 0.0–2.0)
Basophils Absolute: 0 10*3/uL (ref 0.0–0.1)
EOS%: 1.3 % (ref 0.0–7.0)
Eosinophils Absolute: 0.1 10*3/uL (ref 0.0–0.5)
HEMATOCRIT: 39.2 % (ref 38.4–49.9)
HEMOGLOBIN: 12.9 g/dL — AB (ref 13.0–17.1)
LYMPH#: 1.8 10*3/uL (ref 0.9–3.3)
LYMPH%: 25.3 % (ref 14.0–49.0)
MCH: 30.4 pg (ref 27.2–33.4)
MCHC: 32.9 g/dL (ref 32.0–36.0)
MCV: 92.6 fL (ref 79.3–98.0)
MONO#: 0.7 10*3/uL (ref 0.1–0.9)
MONO%: 9.4 % (ref 0.0–14.0)
NEUT%: 63.7 % (ref 39.0–75.0)
NEUTROS ABS: 4.5 10*3/uL (ref 1.5–6.5)
PLATELETS: 206 10*3/uL (ref 140–400)
RBC: 4.23 10*6/uL (ref 4.20–5.82)
RDW: 14 % (ref 11.0–14.6)
WBC: 7.1 10*3/uL (ref 4.0–10.3)

## 2015-05-19 NOTE — Progress Notes (Signed)
Hematology and Oncology Follow Up Visit  [name] [name] [id number] 01-10-1941 75 y.o. 05/19/2015 2:53 PM [name] [name], [name], [name], MD   Principle Diagnosis: 75 year old gentleman with prostate cancer diagnosed in 2010. He had a Gleason score of 3+4 = 7 and a PSA of 4.4. His disease was in 2 cores and the biopsy. 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   Prior Therapy: He was treated with external beam radiation completed in October 2010. His PSA remain under reasonable control to about 2014 when his PSA rose to 165 and subsequently was up to 202. He is imaging studies including a CT scan of the abdomen and pelvis which showed 07/15/2013 to have a 3.0 x 2.1 cm retroperitoneal adenopathy. A left retrocrural lymph node was measuring 1.1 cm. A bone scan at that time showed a single focus of abnormal uptake in the left posterior fourth rib. No clear CT correlation which could suggest trauma.He was started on Firmagon by Dr. Jeffie [name] in May 2015 and his PSA dropped to 18 in October 2015. His PSA in January 2016 was down to 17.48 and in April 2016 up again to 48.52. Testosterone level was at a castrate level of 16. Staging workup continue to show metastatic bony disease without any major visceral involvement. He developed castration resistant disease with a rising PSA up to 48.  Current therapy:  Androgen deprivation under the care of Dr. Jeffie [name]. Xtandi 160 mg daily started in May 2016.  Interim History:  [name] [name] presents today for a follow-up visit with his wife. Since his last visit, he reports no changes is his health. He reports no new complications related to Xtandi at this time. He does not report GI complaints of nausea or abdominal pain. Does not report lower extremity edema.  He has not reported any increased bony pain or pathological fractures.He denied any bleeding complications such as epistaxis, hematochezia or melena. He continues to be on Eliquis for  long-term anticoagulation.  He continues to exercise regularly predominantly walking daily. He does not report any dyspnea on exertion or difficulty breathing associated with that.  He does not report any headaches, blurry vision, double vision or alteration of mental status. He does not report any fevers, chills, sweats or weight loss. He does not report any chest pain, palpitation, orthopnea or PND. Does not report any cough or shortness of breath. He does not report any nausea, vomiting, abdominal pain, change in his bowel habits. He does not report any urgency, hesitancy. Does not report any hematuria or dysuria. Rest of his review of systems unremarkable.  Medications: I have reviewed the patient's current medications.  Current Outpatient Prescriptions  Medication Sig Dispense Refill  . azelastine (ASTELIN) 0.1 % nasal spray Place 1 spray into both nostrils 2 (two) times daily. Use in each nostril as directed    . ELIQUIS 5 MG TABS tablet Take 1 tablet by mouth two  times daily 180 tablet 3  . enzalutamide (XTANDI) 40 MG capsule Take 4 capsules (160mg ) by  mouth every day 120 capsule 0  . fluticasone (FLONASE) 50 MCG/ACT nasal spray Place 2 sprays into both nostrils daily.     . hydrALAZINE (APRESOLINE) 100 MG tablet Take 100 mg by mouth 2 (two) times daily.     Marland Kitchen latanoprost (XALATAN) 0.005 % ophthalmic solution Place 1 drop into both eyes at bedtime.     Marland Kitchen losartan-hydrochlorothiazide (HYZAAR) 100-12.5 MG per tablet Take 1 tablet by mouth daily.     Marland Kitchen  meclizine (ANTIVERT) 25 MG tablet Take 25 mg by mouth 3 (three) times daily as needed for dizziness.    . metFORMIN (GLUCOPHAGE) 500 MG tablet Take 500 mg by mouth daily with breakfast.     . metoprolol (LOPRESSOR) 50 MG tablet TAKE ONE TABLET BY MOUTH TWICE A DAY 180 tablet 3  . simvastatin (ZOCOR) 20 MG tablet Take 20 mg by mouth daily at 6 PM.      No current facility-administered medications for this visit.     Allergies: No Known  Allergies  Past Medical History, Surgical history, Social history, and Family History were reviewed and updated.   Physical Exam: Blood pressure 153/96, pulse 103, temperature 98.2 F (36.8 C), temperature source Oral, resp. rate 18, height 5\' 11"  (1.803 m), weight 265 lb 8 oz (120.43 kg), SpO2 99 %. ECOG: 1 General appearance: alert and cooperative. Healthy appearing gentleman without distress. Head: Normocephalic, without obvious abnormality no oral ulcers or lesions. Neck: no adenopathy Lymph nodes: Cervical, supraclavicular, and axillary nodes normal. Heart:regular rate and rhythm, S1, S2 normal, no murmur, click, rub or gallop Lung:chest clear, no wheezing, rales, normal symmetric air entry no dullness to percussion. Abdomin: soft, non-tender, without masses or organomegaly no shifting dullness or ascites. EXT:no erythema, induration, or nodules   Lab Results: Lab Results  Component Value Date   WBC 7.1 05/19/2015   HGB 12.9* 05/19/2015   HCT 39.2 05/19/2015   MCV 92.6 05/19/2015   PLT 206 05/19/2015     Chemistry      Component Value Date/Time   NA 139 04/17/2015 1012   NA 138 01/27/2015 1719   K 4.1 04/17/2015 1012   K 4.0 01/27/2015 1719   CL 101 01/27/2015 1719   CO2 24 04/17/2015 1012   CO2 27 01/27/2015 1719   BUN 18.6 04/17/2015 1012   BUN 21* 01/27/2015 1719   CREATININE 1.0 04/17/2015 1012   CREATININE 1.09 01/27/2015 1719      Component Value Date/Time   CALCIUM 9.5 04/17/2015 1012   CALCIUM 9.5 01/27/2015 1719   ALKPHOS 72 04/17/2015 1012   ALKPHOS 74 01/27/2015 1719   AST 15 04/17/2015 1012   AST 21 01/27/2015 1719   ALT 16 04/17/2015 1012   ALT 19 01/27/2015 1719   BILITOT 0.66 04/17/2015 1012   BILITOT 0.7 01/27/2015 1719     Results for [name] [name], [name] [name] (MRN [id number]) as of 05/19/2015 14:45  Ref. Range 03/05/2015 09:19 04/17/2015 10:12  PSA Latest Ref Range: <=4.00 ng/mL 2.7 2.11       Impression and Plan:   Assessment and Plan:    75 year old gentleman with:  1. Prostate cancer diagnosed in 2010. He had a Gleason score of 3+4 = 7 and a PSA of 4.4. His disease was in 2 cores and the biopsy. He received external beam radiation as a definitive modality. He developed recurrent disease with retroperitoneal adenopathy and a bony metastasis. His PSA was up to 202. He was treated with androgen deprivation and PSA was down to 18. He subsequently developed castration resistant disease with PSA up to 61. Staging workup including bone scan and CT scan showed multiple area of increased bony uptake in the bone scan. His CT scan did not show any visceral metastasis.  He is currently on Xtandi 160 mg daily started in May 2016 and PSA have declined rapidly since May 2016 from close to 71. his PSA currently at around 2.1. He reports no complications related to this medication I would  like to continue the same dose and schedule. Different salvage regimen will be used upon symptomatic progression.   2. Androgen depravation therapy: He reports no complications related to this. I have recommended continuing this for the time being.  3. Bone directed therapy: This will be considered if he develops high-volume bony metastasis.  4. Atrial fibrillation: He is currently on Eliquis. No bleeding complications noted since the last visit.   5. Follow-up: Will be in 4 to 5  weeks.  [name] Button, MD 4/4/20172:53 PM

## 2015-05-19 NOTE — Telephone Encounter (Signed)
Gave patient avs report and appointments for May. Appointment for 5/4 scheduled in the AM per patient request - patient cannot come in the PM.

## 2015-05-20 LAB — PSA: PROSTATE SPECIFIC AG, SERUM: 2.4 ng/mL (ref 0.0–4.0)

## 2015-06-12 ENCOUNTER — Other Ambulatory Visit: Payer: Self-pay | Admitting: *Deleted

## 2015-06-12 DIAGNOSIS — C61 Malignant neoplasm of prostate: Secondary | ICD-10-CM

## 2015-06-12 MED ORDER — ENZALUTAMIDE 40 MG PO CAPS: ORAL_CAPSULE | ORAL | Status: DC

## 2015-06-18 ENCOUNTER — Other Ambulatory Visit (HOSPITAL_BASED_OUTPATIENT_CLINIC_OR_DEPARTMENT_OTHER): Payer: Medicare Other

## 2015-06-18 ENCOUNTER — Ambulatory Visit (HOSPITAL_BASED_OUTPATIENT_CLINIC_OR_DEPARTMENT_OTHER): Payer: Medicare Other | Admitting: Oncology

## 2015-06-18 ENCOUNTER — Telehealth: Payer: Self-pay | Admitting: Oncology

## 2015-06-18 VITALS — BP 172/113 | HR 85 | Temp 98.3°F | Resp 20 | Wt 260.5 lb

## 2015-06-18 DIAGNOSIS — C61 Malignant neoplasm of prostate: Secondary | ICD-10-CM | POA: Diagnosis not present

## 2015-06-18 DIAGNOSIS — C7951 Secondary malignant neoplasm of bone: Secondary | ICD-10-CM | POA: Diagnosis not present

## 2015-06-18 DIAGNOSIS — I4891 Unspecified atrial fibrillation: Secondary | ICD-10-CM

## 2015-06-18 DIAGNOSIS — E291 Testicular hypofunction: Secondary | ICD-10-CM

## 2015-06-18 LAB — COMPREHENSIVE METABOLIC PANEL
ALT: 15 U/L (ref 0–55)
ANION GAP: 9 meq/L (ref 3–11)
AST: 15 U/L (ref 5–34)
Albumin: 3.9 g/dL (ref 3.5–5.0)
Alkaline Phosphatase: 68 U/L (ref 40–150)
BUN: 17.2 mg/dL (ref 7.0–26.0)
CALCIUM: 10.2 mg/dL (ref 8.4–10.4)
CO2: 28 mEq/L (ref 22–29)
Chloride: 104 mEq/L (ref 98–109)
Creatinine: 1.1 mg/dL (ref 0.7–1.3)
EGFR: 67 mL/min/{1.73_m2} — ABNORMAL LOW (ref >90)
Glucose: 128 mg/dl (ref 70–140)
POTASSIUM: 4.5 meq/L (ref 3.5–5.1)
Sodium: 141 mEq/L (ref 136–145)
Total Bilirubin: 0.79 mg/dL (ref 0.20–1.20)
Total Protein: 7.3 g/dL (ref 6.4–8.3)

## 2015-06-18 LAB — CBC WITH DIFFERENTIAL/PLATELET
BASO%: 0.1 % (ref 0.0–2.0)
BASOS ABS: 0 10*3/uL (ref 0.0–0.1)
EOS%: 1.4 % (ref 0.0–7.0)
Eosinophils Absolute: 0.1 10*3/uL (ref 0.0–0.5)
HEMATOCRIT: 39.6 % (ref 38.4–49.9)
HGB: 13.3 g/dL (ref 13.0–17.1)
LYMPH#: 1.5 10*3/uL (ref 0.9–3.3)
LYMPH%: 22.3 % (ref 14.0–49.0)
MCH: 30.8 pg (ref 27.2–33.4)
MCHC: 33.6 g/dL (ref 32.0–36.0)
MCV: 91.7 fL (ref 79.3–98.0)
MONO#: 0.6 10*3/uL (ref 0.1–0.9)
MONO%: 9.1 % (ref 0.0–14.0)
NEUT#: 4.6 10*3/uL (ref 1.5–6.5)
NEUT%: 67.1 % (ref 39.0–75.0)
PLATELETS: 204 10*3/uL (ref 140–400)
RBC: 4.32 10*6/uL (ref 4.20–5.82)
RDW: 13.5 % (ref 11.0–14.6)
WBC: 6.9 10*3/uL (ref 4.0–10.3)

## 2015-06-18 NOTE — Progress Notes (Signed)
Hematology and Oncology Follow Up Visit  [name] [name] [id number] [birth year] 75 y.o. 06/18/2015 8:48 AM [name] [name], [name], [name], MD   Principle Diagnosis: 75 year old gentleman with prostate cancer diagnosed in 2010. He had a Gleason score of 3+4 = 7 and a PSA of 4.4. His disease was in 2 cores and the biopsy. 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   Prior Therapy: He was treated with external beam radiation completed in October 2010. His PSA remain under reasonable control to about 2014 when his PSA rose to 165 and subsequently was up to 202. He is imaging studies including a CT scan of the abdomen and pelvis which showed 07/15/2013 to have a 3.0 x 2.1 cm retroperitoneal adenopathy. A left retrocrural lymph node was measuring 1.1 cm. A bone scan at that time showed a single focus of abnormal uptake in the left posterior fourth rib. No clear CT correlation which could suggest trauma.He was started on Firmagon by Dr. Jeffie [name] in May 2015 and his PSA dropped to 18 in October 2015. His PSA in January 2016 was down to 17.48 and in April 2016 up again to 48.52. Testosterone level was at a castrate level of 16. Staging workup continue to show metastatic bony disease without any major visceral involvement. He developed castration resistant disease with a rising PSA up to 48.  Current therapy:  Androgen deprivation under the care of Dr. Jeffie [name]. Xtandi 160 mg daily started in May 2016.  Interim History:  [name] [name] presents today for a follow-up visit with his wife. Since his last visit, he continues to do very well. He remains active and performs activities of daily living. He continues to exercise regularly walking close to 2 miles multiple times a week. He intentionally lost 4 pounds because of his exercise. He did miss his blood pressure medications morning which resulted in increased blood pressure. Usually his blood pressure is under excellent control.  He  reports no new complications related to Xtandi at this time. He does not report GI complaints of nausea or abdominal pain. Does not report lower extremity edema.  He has not reported any increased bony pain or pathological fractures.He denied any bleeding complications such as epistaxis, hematochezia or melena.   .  He does not report any headaches, blurry vision, double vision or alteration of mental status. He does not report any fevers, chills, sweats or weight loss. He does not report any chest pain, palpitation, orthopnea or PND. Does not report any cough or shortness of breath. He does not report any nausea, vomiting, abdominal pain, change in his bowel habits. He does not report any urgency, hesitancy. Does not report any hematuria or dysuria. Rest of his review of systems unremarkable.  Medications: I have reviewed the patient's current medications.  Current Outpatient Prescriptions  Medication Sig Dispense Refill  . azelastine (ASTELIN) 0.1 % nasal spray Place 1 spray into both nostrils 2 (two) times daily. Use in each nostril as directed    . ELIQUIS 5 MG TABS tablet Take 1 tablet by mouth two  times daily 180 tablet 3  . enzalutamide (XTANDI) 40 MG capsule Take 4 capsules (160mg ) by  mouth every day 120 capsule 0  . fluticasone (FLONASE) 50 MCG/ACT nasal spray Place 2 sprays into both nostrils daily.     . hydrALAZINE (APRESOLINE) 100 MG tablet Take 100 mg by mouth 2 (two) times daily.     Marland Kitchen latanoprost (XALATAN) 0.005 % ophthalmic solution Place 1  drop into both eyes at bedtime.     Marland Kitchen losartan-hydrochlorothiazide (HYZAAR) 100-12.5 MG per tablet Take 1 tablet by mouth daily.     . meclizine (ANTIVERT) 25 MG tablet Take 25 mg by mouth 3 (three) times daily as needed for dizziness.    . metFORMIN (GLUCOPHAGE) 500 MG tablet Take 500 mg by mouth daily with breakfast.     . metoprolol (LOPRESSOR) 50 MG tablet TAKE ONE TABLET BY MOUTH TWICE A DAY 180 tablet 3  . simvastatin (ZOCOR) 20 MG  tablet Take 20 mg by mouth daily at 6 PM.      No current facility-administered medications for this visit.     Allergies: No Known Allergies  Past Medical History, Surgical history, Social history, and Family History were reviewed and updated.   Physical Exam: Blood pressure 172/113, pulse 85, temperature 98.3 F (36.8 C), temperature source Oral, resp. rate 20, weight 260 lb 8 oz (118.162 kg), SpO2 100 %. ECOG: 1 General appearance: Alert, awake  without distress. Head: Normocephalic, without obvious abnormality no oral thrush noted. Neck: no adenopathy no thyromegaly. Lymph nodes: Cervical, supraclavicular, and axillary nodes normal. Heart:regular rate and rhythm, S1, S2 normal, no murmur, click, rub or gallop Lung:chest clear, no wheezing, rales, normal symmetric air entry no dullness to percussion. Abdomin: soft, non-tender, without masses or organomegaly no rebound or guarding. EXT:no erythema, induration, or nodules   Lab Results: Lab Results  Component Value Date   WBC 6.9 06/18/2015   HGB 13.3 06/18/2015   HCT 39.6 06/18/2015   MCV 91.7 06/18/2015   PLT 204 06/18/2015     Chemistry      Component Value Date/Time   NA 139 05/19/2015 1424   NA 138 01/27/2015 1719   K 4.5 05/19/2015 1424   K 4.0 01/27/2015 1719   CL 101 01/27/2015 1719   CO2 28 05/19/2015 1424   CO2 27 01/27/2015 1719   BUN 18.3 05/19/2015 1424   BUN 21* 01/27/2015 1719   CREATININE 1.2 05/19/2015 1424   CREATININE 1.09 01/27/2015 1719      Component Value Date/Time   CALCIUM 9.7 05/19/2015 1424   CALCIUM 9.5 01/27/2015 1719   ALKPHOS 66 05/19/2015 1424   ALKPHOS 74 01/27/2015 1719   AST 17 05/19/2015 1424   AST 21 01/27/2015 1719   ALT 15 05/19/2015 1424   ALT 19 01/27/2015 1719   BILITOT 0.61 05/19/2015 1424   BILITOT 0.7 01/27/2015 1719      Results for [name] [name], [name] [name] (MRN [id number]) as of 06/18/2015 08:36  Ref. Range 03/05/2015 09:19 04/17/2015 10:12 04/17/2015 10:12 05/19/2015  14:24  PSA Latest Ref Range: 0.0-4.0 ng/mL 2.7 2.11 2.1 2.4       Impression and Plan:   Assessment and Plan:   75 year old gentleman with:  1. Prostate cancer diagnosed in 2010. He had a Gleason score of 3+4 = 7 and a PSA of 4.4. His disease was in 2 cores and the biopsy. He received external beam radiation as a definitive modality. He developed recurrent disease with retroperitoneal adenopathy and a bony metastasis. His PSA was up to 202. He was treated with androgen deprivation and PSA was down to 18. He subsequently developed castration resistant disease with PSA up to 35.   Staging workup including bone scan and CT scan in April 2016 showed multiple area of increased bony uptake in the bone scan. His CT scan did not show any visceral metastasis.  He is currently on Xtandi 160 mg daily  started in May 2016 and PSA have declined rapidly since May 2016 from close to 70. He continues to have excellent response to this therapy with PSA continues to be close to 2. He has no complications related to this medication and his quality of life remains excellent. The plan is to continue with the same dose and schedule and uses different salvage therapy upon symptomatic progression.   2. Androgen depravation therapy: He reports no complications related to this. I have recommended continuing this for the time being.  3. Bone directed therapy: This will be considered upon progression. We continue to discuss this with him on a regular basis.  4. Atrial fibrillation: He is currently on Eliquis. No bleeding complications noted since the last visit.   5. Follow-up: Will be in 4 to 5  weeks.  Dr. [facility], MD 5/4/20178:48 AM

## 2015-06-18 NOTE — Telephone Encounter (Signed)
Gave pt appt & avs °

## 2015-06-19 ENCOUNTER — Telehealth: Payer: Self-pay | Admitting: *Deleted

## 2015-06-19 LAB — PSA: PROSTATE SPECIFIC AG, SERUM: 2.2 ng/mL (ref 0.0–4.0)

## 2015-06-19 NOTE — Telephone Encounter (Signed)
As noted below by Dr. Alen Blew, I informed patient of his PSA level of 2.2. Patient verbalized understanding.

## 2015-06-19 NOTE — Telephone Encounter (Signed)
-----   Message from [name], MD sent at 06/19/2015  8:46 AM EDT ----- Please let him know his PSA. Down again.

## 2015-07-14 ENCOUNTER — Telehealth: Payer: Self-pay | Admitting: Oncology

## 2015-07-14 NOTE — Telephone Encounter (Signed)
returned call adn s.w. pt and confirmed appt....pt was upset that appts were adjusted to later time....i do not see where anything was changed. pt still comming for appt

## 2015-07-16 ENCOUNTER — Ambulatory Visit (HOSPITAL_BASED_OUTPATIENT_CLINIC_OR_DEPARTMENT_OTHER): Payer: Medicare Other | Admitting: Oncology

## 2015-07-16 ENCOUNTER — Telehealth: Payer: Self-pay | Admitting: Oncology

## 2015-07-16 ENCOUNTER — Other Ambulatory Visit (HOSPITAL_BASED_OUTPATIENT_CLINIC_OR_DEPARTMENT_OTHER): Payer: Medicare Other

## 2015-07-16 ENCOUNTER — Other Ambulatory Visit: Payer: Self-pay | Admitting: *Deleted

## 2015-07-16 VITALS — BP 128/78 | HR 83 | Temp 98.1°F | Resp 16 | Ht 71.0 in | Wt 259.0 lb

## 2015-07-16 DIAGNOSIS — C7951 Secondary malignant neoplasm of bone: Secondary | ICD-10-CM

## 2015-07-16 DIAGNOSIS — E291 Testicular hypofunction: Secondary | ICD-10-CM

## 2015-07-16 DIAGNOSIS — C61 Malignant neoplasm of prostate: Secondary | ICD-10-CM | POA: Diagnosis not present

## 2015-07-16 DIAGNOSIS — I4891 Unspecified atrial fibrillation: Secondary | ICD-10-CM | POA: Diagnosis not present

## 2015-07-16 LAB — CBC WITH DIFFERENTIAL/PLATELET
BASO%: 0.3 % (ref 0.0–2.0)
Basophils Absolute: 0 10*3/uL (ref 0.0–0.1)
EOS%: 1.5 % (ref 0.0–7.0)
Eosinophils Absolute: 0.1 10*3/uL (ref 0.0–0.5)
HCT: 38.6 % (ref 38.4–49.9)
HEMOGLOBIN: 12.8 g/dL — AB (ref 13.0–17.1)
LYMPH%: 22.6 % (ref 14.0–49.0)
MCH: 30.6 pg (ref 27.2–33.4)
MCHC: 33 g/dL (ref 32.0–36.0)
MCV: 92.5 fL (ref 79.3–98.0)
MONO#: 0.7 10*3/uL (ref 0.1–0.9)
MONO%: 10.8 % (ref 0.0–14.0)
NEUT%: 64.8 % (ref 39.0–75.0)
NEUTROS ABS: 4.1 10*3/uL (ref 1.5–6.5)
Platelets: 203 10*3/uL (ref 140–400)
RBC: 4.18 10*6/uL — AB (ref 4.20–5.82)
RDW: 13.9 % (ref 11.0–14.6)
WBC: 6.3 10*3/uL (ref 4.0–10.3)
lymph#: 1.4 10*3/uL (ref 0.9–3.3)

## 2015-07-16 LAB — COMPREHENSIVE METABOLIC PANEL
ALBUMIN: 3.5 g/dL (ref 3.5–5.0)
ALK PHOS: 67 U/L (ref 40–150)
ALT: 12 U/L (ref 0–55)
AST: 13 U/L (ref 5–34)
Anion Gap: 8 mEq/L (ref 3–11)
BILIRUBIN TOTAL: 0.79 mg/dL (ref 0.20–1.20)
BUN: 15.3 mg/dL (ref 7.0–26.0)
CO2: 26 mEq/L (ref 22–29)
Calcium: 9.4 mg/dL (ref 8.4–10.4)
Chloride: 105 mEq/L (ref 98–109)
Creatinine: 1 mg/dL (ref 0.7–1.3)
EGFR: 70 mL/min/{1.73_m2} — AB (ref >90)
GLUCOSE: 98 mg/dL (ref 70–140)
Potassium: 3.8 mEq/L (ref 3.5–5.1)
SODIUM: 139 meq/L (ref 136–145)
TOTAL PROTEIN: 7.2 g/dL (ref 6.4–8.3)

## 2015-07-16 MED ORDER — ENZALUTAMIDE 40 MG PO CAPS: ORAL_CAPSULE | ORAL | Status: DC

## 2015-07-16 NOTE — Progress Notes (Signed)
Hematology and Oncology Follow Up Visit  [name] [name] [id number] 1940/05/07 75 y.o. 07/16/2015 10:01 AM [name] [name], [name], [name], MD   Principle Diagnosis: 75 year old gentleman with prostate cancer diagnosed in 2010. He had a Gleason score of 3+4 = 7 and a PSA of 4.4. His disease was in 2 cores and the biopsy. 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   Prior Therapy: He was treated with external beam radiation completed in October 2010. His PSA remain under reasonable control to about 2014 when his PSA rose to 165 and subsequently was up to 202. He is imaging studies including a CT scan of the abdomen and pelvis which showed 07/15/2013 to have a 3.0 x 2.1 cm retroperitoneal adenopathy. A left retrocrural lymph node was measuring 1.1 cm. A bone scan at that time showed a single focus of abnormal uptake in the left posterior fourth rib. No clear CT correlation which could suggest trauma.He was started on Firmagon by Dr. Jeffie Pollock in May 2015 and his PSA dropped to 18 in October 2015. His PSA in January 2016 was down to 17.48 and in April 2016 up again to 48.52. Testosterone level was at a castrate level of 16. Staging workup continue to show metastatic bony disease without any major visceral involvement. He developed castration resistant disease with a rising PSA up to 48.  Current therapy:  Androgen deprivation under the care of Dr. Jeffie Pollock. Xtandi 160 mg daily started in May 2016.  Interim History:  [name] [name] presents today for a follow-up visit with his wife. Since his last visit, he reports no major changes in his health.He reports no new complications related to Xtandi at this time. He does not report GI complaints of nausea or abdominal pain. Does not report lower extremity edema.  He has not reported any increased bony pain or pathological fractures.He denied any bleeding complications such as epistaxis, hematochezia or melena. He reports decrease in his  appetite and have lost some weight gradually and some of it is intentionally.   He remains active and performs activities of daily living. He continues to exercise regularly walking close to 2 miles multiple times a week. His blood pressure have remained under excellent control.  He reports no new complications related to Xtandi at this time. He does not report GI complaints of nausea or abdominal pain. Does not report lower extremity edema.  He has not reported any increased bony pain or pathological fractures.He denied any bleeding complications such as epistaxis, hematochezia or melena.   .  He does not report any headaches, blurry vision, double vision or alteration of mental status. He does not report any fevers, chills, sweats or weight loss. He does not report any chest pain, palpitation, orthopnea or PND. Does not report any cough or shortness of breath. He does not report any nausea, vomiting, abdominal pain, change in his bowel habits. He does not report any urgency, hesitancy. Does not report any hematuria or dysuria. Rest of his review of systems unremarkable.  Medications: I have reviewed the patient's current medications.  Current Outpatient Prescriptions  Medication Sig Dispense Refill  . azelastine (ASTELIN) 0.1 % nasal spray Place 1 spray into both nostrils 2 (two) times daily. Use in each nostril as directed    . ELIQUIS 5 MG TABS tablet Take 1 tablet by mouth two  times daily 180 tablet 3  . enzalutamide (XTANDI) 40 MG capsule Take 4 capsules (160mg ) by  mouth every day 120 capsule  0  . fluticasone (FLONASE) 50 MCG/ACT nasal spray Place 2 sprays into both nostrils daily.     . hydrALAZINE (APRESOLINE) 100 MG tablet Take 100 mg by mouth 2 (two) times daily.     Marland Kitchen latanoprost (XALATAN) 0.005 % ophthalmic solution Place 1 drop into both eyes at bedtime.     Marland Kitchen losartan-hydrochlorothiazide (HYZAAR) 100-12.5 MG per tablet Take 1 tablet by mouth daily.     . meclizine (ANTIVERT) 25  MG tablet Take 25 mg by mouth 3 (three) times daily as needed for dizziness.    . metFORMIN (GLUCOPHAGE) 500 MG tablet Take 500 mg by mouth daily with breakfast.     . metoprolol (LOPRESSOR) 50 MG tablet TAKE ONE TABLET BY MOUTH TWICE A DAY 180 tablet 3  . simvastatin (ZOCOR) 20 MG tablet Take 20 mg by mouth daily at 6 PM.      No current facility-administered medications for this visit.     Allergies: No Known Allergies  Past Medical History, Surgical history, Social history, and Family History were reviewed and updated.   Physical Exam: Blood pressure 128/78, pulse 83, temperature 98.1 F (36.7 C), temperature source Oral, resp. rate 16, height 5\' 11"  (1.803 m), weight 259 lb (117.482 kg), SpO2 98 %. ECOG: 1 General appearance: Alert, awake gentleman without distress. Head: Normocephalic, without obvious abnormality no oral ulcers or lesions. Neck: no adenopathy no thyromegaly. Lymph nodes: Cervical, supraclavicular, and axillary nodes normal. Heart:regular rate and rhythm, S1, S2 normal, no murmur, click, rub or gallop Lung:chest clear, no wheezing, rales, normal symmetric air entry no dullness to percussion. Abdomin: soft, non-tender, without masses or organomegaly no shifting dullness or ascites. EXT:no erythema, induration, or nodules   Lab Results: Lab Results  Component Value Date   WBC 6.3 07/16/2015   HGB 12.8* 07/16/2015   HCT 38.6 07/16/2015   MCV 92.5 07/16/2015   PLT 203 07/16/2015     Chemistry      Component Value Date/Time   NA 141 06/18/2015 0814   NA 138 01/27/2015 1719   K 4.5 06/18/2015 0814   K 4.0 01/27/2015 1719   CL 101 01/27/2015 1719   CO2 28 06/18/2015 0814   CO2 27 01/27/2015 1719   BUN 17.2 06/18/2015 0814   BUN 21* 01/27/2015 1719   CREATININE 1.1 06/18/2015 0814   CREATININE 1.09 01/27/2015 1719      Component Value Date/Time   CALCIUM 10.2 06/18/2015 0814   CALCIUM 9.5 01/27/2015 1719   ALKPHOS 68 06/18/2015 0814   ALKPHOS 74  01/27/2015 1719   AST 15 06/18/2015 0814   AST 21 01/27/2015 1719   ALT 15 06/18/2015 0814   ALT 19 01/27/2015 1719   BILITOT 0.79 06/18/2015 0814   BILITOT 0.7 01/27/2015 1719          Results for PETROS, RAIA (MRN [id number]) as of 07/16/2015 09:51  Ref. Range 04/17/2015 10:12 04/17/2015 10:12 05/19/2015 14:24 06/18/2015 08:14  PSA Latest Ref Range: 0.0-4.0 ng/mL 2.11 2.1 2.4 2.2    Impression and Plan:   Assessment and Plan:   75 year old gentleman with:  1. Prostate cancer diagnosed in 2010. He had a Gleason score of 3+4 = 7 and a PSA of 4.4. His disease was in 2 cores and the biopsy. He received external beam radiation as a definitive modality. He developed recurrent disease with retroperitoneal adenopathy and a bony metastasis. His PSA was up to 202. He was treated with androgen deprivation and PSA was down to  46. He subsequently developed castration resistant disease with PSA up to 26.   Staging workup including bone scan and CT scan in April 2016 showed multiple area of increased bony uptake in the bone scan. His CT scan did not show any visceral metastasis.  He is currently on Xtandi 160 mg daily started in May 2016 continue to tolerated well. His PSA remains reasonably controlled without any changing in the dose or schedule. The plan is to continue at this time and he is different salvage therapy upon symptomatic progression.   2. Androgen depravation therapy: He reports no complications related to this. I have recommended continuing this for the time being.  3. Bone directed therapy: This is deferred at this time and will be utilized if he develops progressive bony metastasis.  4. Atrial fibrillation: He is currently on Eliquis. No bleeding complications noted since the last visit.   5. Follow-up: Will be in 6  weeks.  Airam Runions, MD 6/1/201710:01 AM

## 2015-07-16 NOTE — Telephone Encounter (Signed)
Gave pt apt & avs °

## 2015-07-16 NOTE — Telephone Encounter (Signed)
per pof to sch pt appt-gave pt copy of avs °

## 2015-07-17 LAB — PSA: Prostate Specific Ag, Serum: 2.9 ng/mL (ref 0.0–4.0)

## 2015-07-20 ENCOUNTER — Telehealth: Payer: Self-pay

## 2015-07-20 NOTE — Telephone Encounter (Signed)
Pt called about labs drawn last Thursday.  Value given 2.9 up from 2.2. Pt knows next check is 7/18.

## 2015-07-25 ENCOUNTER — Other Ambulatory Visit: Payer: Self-pay | Admitting: Cardiovascular Disease

## 2015-08-17 ENCOUNTER — Other Ambulatory Visit: Payer: Self-pay | Admitting: *Deleted

## 2015-08-17 DIAGNOSIS — C61 Malignant neoplasm of prostate: Secondary | ICD-10-CM

## 2015-08-17 MED ORDER — ENZALUTAMIDE 40 MG PO CAPS: ORAL_CAPSULE | ORAL | Status: DC

## 2015-09-01 ENCOUNTER — Other Ambulatory Visit (HOSPITAL_BASED_OUTPATIENT_CLINIC_OR_DEPARTMENT_OTHER): Payer: Medicare Other

## 2015-09-01 ENCOUNTER — Ambulatory Visit (HOSPITAL_BASED_OUTPATIENT_CLINIC_OR_DEPARTMENT_OTHER): Payer: Medicare Other | Admitting: Oncology

## 2015-09-01 ENCOUNTER — Telehealth: Payer: Self-pay | Admitting: Oncology

## 2015-09-01 VITALS — BP 118/88 | HR 86 | Temp 97.6°F | Resp 18 | Ht 71.0 in | Wt 260.0 lb

## 2015-09-01 DIAGNOSIS — E291 Testicular hypofunction: Secondary | ICD-10-CM

## 2015-09-01 DIAGNOSIS — C61 Malignant neoplasm of prostate: Secondary | ICD-10-CM | POA: Diagnosis not present

## 2015-09-01 DIAGNOSIS — C7951 Secondary malignant neoplasm of bone: Secondary | ICD-10-CM

## 2015-09-01 DIAGNOSIS — I4891 Unspecified atrial fibrillation: Secondary | ICD-10-CM

## 2015-09-01 LAB — COMPREHENSIVE METABOLIC PANEL
ALBUMIN: 3.9 g/dL (ref 3.5–5.0)
ALK PHOS: 76 U/L (ref 40–150)
ALT: 18 U/L (ref 0–55)
AST: 19 U/L (ref 5–34)
Anion Gap: 10 mEq/L (ref 3–11)
BILIRUBIN TOTAL: 0.75 mg/dL (ref 0.20–1.20)
BUN: 17.2 mg/dL (ref 7.0–26.0)
CALCIUM: 9.5 mg/dL (ref 8.4–10.4)
CO2: 25 mEq/L (ref 22–29)
Chloride: 103 mEq/L (ref 98–109)
Creatinine: 1 mg/dL (ref 0.7–1.3)
EGFR: 72 mL/min/{1.73_m2} — AB (ref >90)
GLUCOSE: 96 mg/dL (ref 70–140)
POTASSIUM: 4 meq/L (ref 3.5–5.1)
SODIUM: 138 meq/L (ref 136–145)
TOTAL PROTEIN: 7.5 g/dL (ref 6.4–8.3)

## 2015-09-01 LAB — CBC WITH DIFFERENTIAL/PLATELET
BASO%: 0.1 % (ref 0.0–2.0)
BASOS ABS: 0 10*3/uL (ref 0.0–0.1)
EOS ABS: 0.1 10*3/uL (ref 0.0–0.5)
EOS%: 1.2 % (ref 0.0–7.0)
HEMATOCRIT: 38.6 % (ref 38.4–49.9)
HEMOGLOBIN: 13.3 g/dL (ref 13.0–17.1)
LYMPH#: 1.6 10*3/uL (ref 0.9–3.3)
LYMPH%: 21.9 % (ref 14.0–49.0)
MCH: 31.3 pg (ref 27.2–33.4)
MCHC: 34.5 g/dL (ref 32.0–36.0)
MCV: 90.8 fL (ref 79.3–98.0)
MONO#: 0.7 10*3/uL (ref 0.1–0.9)
MONO%: 9.5 % (ref 0.0–14.0)
NEUT%: 67.3 % (ref 39.0–75.0)
NEUTROS ABS: 5 10*3/uL (ref 1.5–6.5)
PLATELETS: 203 10*3/uL (ref 140–400)
RBC: 4.25 10*6/uL (ref 4.20–5.82)
RDW: 13.5 % (ref 11.0–14.6)
WBC: 7.4 10*3/uL (ref 4.0–10.3)

## 2015-09-01 NOTE — Telephone Encounter (Signed)
Gave pt cal & avs °

## 2015-09-01 NOTE — Progress Notes (Signed)
Hematology and Oncology Follow Up Visit  CAMERON CHREST [id number] [birth year] 75 y.o. 09/01/2015 12:18 PM [name], [name], [name], MD   Principle Diagnosis: 75 year old gentleman with prostate cancer diagnosed in 2010. He had a Gleason score of 3+4 = 7 and a PSA of 4.4. His disease was in 2 cores and the biopsy. 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   Prior Therapy: He was treated with external beam radiation completed in October 2010. His PSA remain under reasonable control to about 2014 when his PSA rose to 165 and subsequently was up to 202. He is imaging studies including a CT scan of the abdomen and pelvis which showed 07/15/2013 to have a 3.0 x 2.1 cm retroperitoneal adenopathy. A left retrocrural lymph node was measuring 1.1 cm. A bone scan at that time showed a single focus of abnormal uptake in the left posterior fourth rib. No clear CT correlation which could suggest trauma.He was started on Firmagon by Dr. Jeffie Pollock in May 2015 and his PSA dropped to 18 in October 2015. His PSA in January 2016 was down to 17.48 and in April 2016 up again to 48.52. Testosterone level was at a castrate level of 16. Staging workup continue to show metastatic bony disease without any major visceral involvement. He developed castration resistant disease with a rising PSA up to 48.  Current therapy:  Androgen deprivation under the care of Dr. Jeffie Pollock. Xtandi 160 mg daily started in May 2016.  Interim History:  Mr. Herrig presents today for a follow-up visit with his wife. Since his last visit, he continues to do very well.He reports no new complications related to Xtandi at this time. He takes it regularly without any missing doses. He does not report GI complaints of nausea or abdominal pain. Does not report lower extremity edema.  He has not reported any increased bony pain or pathological fractures.He denied any bleeding complications such as epistaxis, hematochezia or  melena.   He remains active and performs activities of daily living. He continues to exercise regularly predominantly walking indoors at the [facility]. He denied any change in his appetite, hospitalization or illnesses. He denied any pathological fractures or bone pain.  He does not report any headaches, blurry vision, double vision or alteration of mental status. He does not report any fevers, chills, sweats or weight loss. He does not report any chest pain, palpitation, orthopnea or PND. Does not report any cough or shortness of breath. He does not report any nausea, vomiting, abdominal pain, change in his bowel habits. He does not report any urgency, hesitancy. Does not report any hematuria or dysuria. Rest of his review of systems unremarkable.  Medications: I have reviewed the patient's current medications.  Current Outpatient Prescriptions  Medication Sig Dispense Refill  . azelastine (ASTELIN) 0.1 % nasal spray Place 1 spray into both nostrils 2 (two) times daily. Use in each nostril as directed    . ELIQUIS 5 MG TABS tablet Take 1 tablet by mouth two  times daily 180 tablet 2  . enzalutamide (XTANDI) 40 MG capsule Take 4 capsules (160mg ) by  mouth every day 120 capsule 0  . fluticasone (FLONASE) 50 MCG/ACT nasal spray Place 2 sprays into both nostrils daily.     . hydrALAZINE (APRESOLINE) 100 MG tablet Take 100 mg by mouth 2 (two) times daily.     Marland Kitchen latanoprost (XALATAN) 0.005 % ophthalmic solution Place 1 drop into both eyes at bedtime.     Marland Kitchen  losartan-hydrochlorothiazide (HYZAAR) 100-12.5 MG per tablet Take 1 tablet by mouth daily.     . meclizine (ANTIVERT) 25 MG tablet Take 25 mg by mouth 3 (three) times daily as needed for dizziness.    . metFORMIN (GLUCOPHAGE) 500 MG tablet Take 500 mg by mouth daily with breakfast.     . metoprolol (LOPRESSOR) 50 MG tablet TAKE ONE TABLET BY MOUTH TWICE A DAY 180 tablet 3  . simvastatin (ZOCOR) 20 MG tablet Take 20 mg by mouth daily at 6 PM.      No  current facility-administered medications for this visit.     Allergies: No Known Allergies  Past Medical History, Surgical history, Social history, and Family History were reviewed and updated.   Physical Exam: Blood pressure 118/88, pulse 86, temperature 97.6 F (36.4 C), temperature source Oral, resp. rate 18, height 5\' 11"  (1.803 m), weight 260 lb (117.935 kg), SpO2 100 %. ECOG: 1 General appearance: Pleasant-appearing gentleman without distress. Head: Normocephalic, without obvious abnormality no oral thrush noted. Neck: no adenopathy no thyromegaly. Lymph nodes: Cervical, supraclavicular, and axillary nodes normal. Heart:regular rate and rhythm, S1, S2 normal, no murmur, click, rub or gallop Lung:chest clear, no wheezing, rales, normal symmetric air entry no dullness to percussion. Abdomin: soft, non-tender, without masses or organomegaly no rebound or guarding. EXT:no erythema, induration, or nodules   Lab Results: Lab Results  Component Value Date   WBC 7.4 09/01/2015   HGB 13.3 09/01/2015   HCT 38.6 09/01/2015   MCV 90.8 09/01/2015   PLT 203 09/01/2015     Chemistry      Component Value Date/Time   NA 138 09/01/2015 1123   NA 138 01/27/2015 1719   K 4.0 09/01/2015 1123   K 4.0 01/27/2015 1719   CL 101 01/27/2015 1719   CO2 25 09/01/2015 1123   CO2 27 01/27/2015 1719   BUN 17.2 09/01/2015 1123   BUN 21* 01/27/2015 1719   CREATININE 1.0 09/01/2015 1123   CREATININE 1.09 01/27/2015 1719      Component Value Date/Time   CALCIUM 9.5 09/01/2015 1123   CALCIUM 9.5 01/27/2015 1719   ALKPHOS 76 09/01/2015 1123   ALKPHOS 74 01/27/2015 1719   AST 19 09/01/2015 1123   AST 21 01/27/2015 1719   ALT 18 09/01/2015 1123   ALT 19 01/27/2015 1719   BILITOT 0.75 09/01/2015 1123   BILITOT 0.7 01/27/2015 1719       Results for CRYSTOPHER, DURST (MRN [id number]) as of 09/01/2015 12:07  Ref. Range 05/19/2015 14:24 06/18/2015 08:14 07/16/2015 10:08  PSA Latest Ref Range:  0.0-4.0 ng/mL 2.4 2.2 2.9     Impression and Plan:   Assessment and Plan:   75 year old gentleman with:  1. Prostate cancer diagnosed in 2010. He had a Gleason score of 3+4 = 7 and a PSA of 4.4. His disease was in 2 cores and the biopsy. He received external beam radiation as a definitive modality. He developed recurrent disease with retroperitoneal adenopathy and a bony metastasis. His PSA was up to 202. He was treated with androgen deprivation and PSA was down to 18. He subsequently developed castration resistant disease with PSA up to 51.   Staging workup including bone scan and CT scan in April 2016 showed multiple area of increased bony uptake in the bone scan. His CT scan did not show any visceral metastasis.  He is currently on Xtandi 160 mg daily started in May 2016 continue to tolerated well. His PSA Continues to be  under excellent control although his PSA on 07/16/2015 did go up slightly to 2.9. The plan is to continue with the same dose and schedule and uses different salvage therapy upon symptomatic progression.   2. Androgen depravation therapy: I recommended this to continue indefinitely.  3. Bone directed therapy: This is deferred at this time and will be utilized if he develops progressive bony metastasis.  4. Atrial fibrillation: He is currently on Eliquis. No bleeding complications noted since the last visit.   5. Follow-up: Will be in 6  weeks.  Zola Button, MD 7/18/201712:18 PM

## 2015-09-02 ENCOUNTER — Telehealth: Payer: Self-pay | Admitting: *Deleted

## 2015-09-02 LAB — PSA: Prostate Specific Ag, Serum: 3.7 ng/mL (ref 0.0–4.0)

## 2015-09-02 NOTE — Telephone Encounter (Signed)
Spoke with patient, gave results of last PSA 

## 2015-09-02 NOTE — Telephone Encounter (Signed)
-----   Message from [name], MD sent at 09/02/2015  8:37 AM EDT ----- Please let him know his PSA. No change for now.

## 2015-09-11 ENCOUNTER — Ambulatory Visit (INDEPENDENT_AMBULATORY_CARE_PROVIDER_SITE_OTHER): Payer: Medicare Other | Admitting: Urology

## 2015-09-11 DIAGNOSIS — C61 Malignant neoplasm of prostate: Secondary | ICD-10-CM | POA: Diagnosis not present

## 2015-09-11 DIAGNOSIS — N3941 Urge incontinence: Secondary | ICD-10-CM | POA: Diagnosis not present

## 2015-09-11 DIAGNOSIS — N3040 Irradiation cystitis without hematuria: Secondary | ICD-10-CM

## 2015-09-14 ENCOUNTER — Other Ambulatory Visit: Payer: Self-pay | Admitting: *Deleted

## 2015-09-15 ENCOUNTER — Other Ambulatory Visit: Payer: Self-pay | Admitting: *Deleted

## 2015-09-15 DIAGNOSIS — C61 Malignant neoplasm of prostate: Secondary | ICD-10-CM

## 2015-09-15 MED ORDER — ENZALUTAMIDE 40 MG PO CAPS: ORAL_CAPSULE | ORAL | 0 refills | Status: DC

## 2015-10-05 ENCOUNTER — Other Ambulatory Visit: Payer: Self-pay | Admitting: *Deleted

## 2015-10-05 DIAGNOSIS — C61 Malignant neoplasm of prostate: Secondary | ICD-10-CM

## 2015-10-05 MED ORDER — ENZALUTAMIDE 40 MG PO CAPS: ORAL_CAPSULE | ORAL | 0 refills | Status: DC

## 2015-10-22 ENCOUNTER — Telehealth: Payer: Self-pay | Admitting: Oncology

## 2015-10-22 ENCOUNTER — Other Ambulatory Visit (HOSPITAL_BASED_OUTPATIENT_CLINIC_OR_DEPARTMENT_OTHER): Payer: Medicare Other

## 2015-10-22 ENCOUNTER — Ambulatory Visit (HOSPITAL_BASED_OUTPATIENT_CLINIC_OR_DEPARTMENT_OTHER): Payer: Medicare Other | Admitting: Oncology

## 2015-10-22 VITALS — BP 119/96 | HR 80 | Temp 98.5°F | Resp 18 | Wt 257.9 lb

## 2015-10-22 DIAGNOSIS — C61 Malignant neoplasm of prostate: Secondary | ICD-10-CM | POA: Diagnosis not present

## 2015-10-22 DIAGNOSIS — C7951 Secondary malignant neoplasm of bone: Secondary | ICD-10-CM

## 2015-10-22 LAB — CBC WITH DIFFERENTIAL/PLATELET
BASO%: 0.2 % (ref 0.0–2.0)
Basophils Absolute: 0 10*3/uL (ref 0.0–0.1)
EOS%: 1.3 % (ref 0.0–7.0)
Eosinophils Absolute: 0.1 10*3/uL (ref 0.0–0.5)
HEMATOCRIT: 38.3 % — AB (ref 38.4–49.9)
HEMOGLOBIN: 13.2 g/dL (ref 13.0–17.1)
LYMPH#: 1.4 10*3/uL (ref 0.9–3.3)
LYMPH%: 21.3 % (ref 14.0–49.0)
MCH: 31.4 pg (ref 27.2–33.4)
MCHC: 34.5 g/dL (ref 32.0–36.0)
MCV: 91 fL (ref 79.3–98.0)
MONO#: 0.7 10*3/uL (ref 0.1–0.9)
MONO%: 11.4 % (ref 0.0–14.0)
NEUT%: 65.8 % (ref 39.0–75.0)
NEUTROS ABS: 4.2 10*3/uL (ref 1.5–6.5)
PLATELETS: 204 10*3/uL (ref 140–400)
RBC: 4.21 10*6/uL (ref 4.20–5.82)
RDW: 13.2 % (ref 11.0–14.6)
WBC: 6.4 10*3/uL (ref 4.0–10.3)

## 2015-10-22 LAB — COMPREHENSIVE METABOLIC PANEL
ALT: 14 U/L (ref 0–55)
ANION GAP: 10 meq/L (ref 3–11)
AST: 16 U/L (ref 5–34)
Albumin: 3.7 g/dL (ref 3.5–5.0)
Alkaline Phosphatase: 73 U/L (ref 40–150)
BUN: 16 mg/dL (ref 7.0–26.0)
CALCIUM: 9.5 mg/dL (ref 8.4–10.4)
CHLORIDE: 104 meq/L (ref 98–109)
CO2: 23 mEq/L (ref 22–29)
Creatinine: 1 mg/dL (ref 0.7–1.3)
EGFR: 78 mL/min/{1.73_m2} — ABNORMAL LOW (ref >90)
Glucose: 81 mg/dl (ref 70–140)
Potassium: 4.2 mEq/L (ref 3.5–5.1)
Sodium: 137 mEq/L (ref 136–145)
Total Bilirubin: 0.68 mg/dL (ref 0.20–1.20)
Total Protein: 7.3 g/dL (ref 6.4–8.3)

## 2015-10-22 NOTE — Telephone Encounter (Signed)
AVS REPORT AND APPT SCHD GIVEN PER 10/22/15 LOS.  °

## 2015-10-22 NOTE — Progress Notes (Signed)
Hematology and Oncology Follow Up Visit  DAJHON GRIFFIS [id number] 1940/06/24 75 y.o. 10/22/2015 10:39 AM [name], [name], [name], MD   Principle Diagnosis: 75 year old gentleman with prostate cancer diagnosed in 2010. He had a Gleason score of 3+4 = 7 and a PSA of 4.4. His disease was in 2 cores and the biopsy. 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   Prior Therapy: He was treated with external beam radiation completed in October 2010. His PSA remain under reasonable control to about 2014 when his PSA rose to 165 and subsequently was up to 202. He is imaging studies including a CT scan of the abdomen and pelvis which showed 07/15/2013 to have a 3.0 x 2.1 cm retroperitoneal adenopathy. A left retrocrural lymph node was measuring 1.1 cm. A bone scan at that time showed a single focus of abnormal uptake in the left posterior fourth rib. No clear CT correlation which could suggest trauma.He was started on Firmagon by Dr. Jeffie Pollock in May 2015 and his PSA dropped to 18 in October 2015. His PSA in January 2016 was down to 17.48 and in April 2016 up again to 48.52. Testosterone level was at a castrate level of 16. Staging workup continue to show metastatic bony disease without any major visceral involvement. He developed castration resistant disease with a rising PSA up to 48.  Current therapy:  Androgen deprivation under the care of Dr. Jeffie Pollock. Xtandi 160 mg daily started in May 2016.  Interim History:  Mr. Mallis presents today for a follow-up visit with his wife. Since his last visit, he reports no major changes in his health. He continues to enjoy excellent quality of life and performance status. He denied any bone pain or pathological fractures. He denied any decline in his energy or excessive fatigue. He is exercising regularly and have lost intentionally 3 pounds.  He continues to take Xtandi at this time without any new side effects. He takes it regularly  without any missing doses. He does not report GI complaints of nausea or abdominal pain. Does not report lower extremity edema.  He has not reported any increased bony pain or pathological fractures.He denied any bleeding complications such as epistaxis, hematochezia or melena.   He does not report any headaches, blurry vision, double vision or alteration of mental status. He does not report any fevers, chills, sweats or weight loss. He does not report any chest pain, palpitation, orthopnea or PND. Does not report any cough or shortness of breath. He does not report any nausea, vomiting, abdominal pain, change in his bowel habits. He does not report any urgency, hesitancy. Does not report any hematuria or dysuria. Rest of his review of systems unremarkable.  Medications: I have reviewed the patient's current medications.  Current Outpatient Prescriptions  Medication Sig Dispense Refill  . azelastine (ASTELIN) 0.1 % nasal spray Place 1 spray into both nostrils 2 (two) times daily. Use in each nostril as directed    . ELIQUIS 5 MG TABS tablet Take 1 tablet by mouth two  times daily 180 tablet 2  . enzalutamide (XTANDI) 40 MG capsule Take 4 capsules (160mg ) by  mouth every day 120 capsule 0  . fluticasone (FLONASE) 50 MCG/ACT nasal spray Place 2 sprays into both nostrils daily.     . hydrALAZINE (APRESOLINE) 100 MG tablet Take 100 mg by mouth 2 (two) times daily.     Marland Kitchen latanoprost (XALATAN) 0.005 % ophthalmic solution Place 1 drop into both eyes at  bedtime.     Marland Kitchen losartan-hydrochlorothiazide (HYZAAR) 100-12.5 MG per tablet Take 1 tablet by mouth daily.     . meclizine (ANTIVERT) 25 MG tablet Take 25 mg by mouth 3 (three) times daily as needed for dizziness.    . metFORMIN (GLUCOPHAGE) 500 MG tablet Take 500 mg by mouth daily with breakfast.     . metoprolol (LOPRESSOR) 50 MG tablet TAKE ONE TABLET BY MOUTH TWICE A DAY 180 tablet 3  . simvastatin (ZOCOR) 20 MG tablet Take 20 mg by mouth daily at 6 PM.       No current facility-administered medications for this visit.      Allergies: No Known Allergies  Past Medical History, Surgical history, Social history, and Family History were reviewed and updated.   Physical Exam: Blood pressure (!) 119/96, pulse 80, temperature 98.5 F (36.9 C), temperature source Oral, resp. rate 18, weight 257 lb 14.4 oz (117 kg), SpO2 98 %. ECOG: 1 General appearance: Well-appearing gentleman without distress. Head: Normocephalic, without obvious abnormality no oral ulcers or lesions. Neck: no adenopathy no thyromegaly. Lymph nodes: Cervical, supraclavicular, and axillary nodes normal. Heart:regular rate and rhythm, S1, S2 normal, no murmur, click, rub or gallop Lung:chest clear, no wheezing, rales, normal symmetric air entry no dullness to percussion. Abdomin: soft, non-tender, without masses or organomegaly no shifting dullness or ascites. EXT:no erythema, induration, or nodules   Lab Results: Lab Results  Component Value Date   WBC 6.4 10/22/2015   HGB 13.2 10/22/2015   HCT 38.3 (L) 10/22/2015   MCV 91.0 10/22/2015   PLT 204 10/22/2015     Chemistry      Component Value Date/Time   NA 138 09/01/2015 1123   K 4.0 09/01/2015 1123   CL 101 01/27/2015 1719   CO2 25 09/01/2015 1123   BUN 17.2 09/01/2015 1123   CREATININE 1.0 09/01/2015 1123      Component Value Date/Time   CALCIUM 9.5 09/01/2015 1123   ALKPHOS 76 09/01/2015 1123   AST 19 09/01/2015 1123   ALT 18 09/01/2015 1123   BILITOT 0.75 09/01/2015 1123           Impression and Plan:   Assessment and Plan:   75 year old gentleman with:  1. Prostate cancer diagnosed in 2010. He had a Gleason score of 3+4 = 7 and a PSA of 4.4. His disease was in 2 cores and the biopsy. He received external beam radiation as a definitive modality. He developed recurrent disease with retroperitoneal adenopathy and a bony metastasis. His PSA was up to 202. He was treated with androgen  deprivation and PSA was down to 18. He subsequently developed castration resistant disease with PSA up to 69.   Staging workup including bone scan and CT scan in April 2016 showed multiple area of increased bony uptake in the bone scan. His CT scan did not show any visceral metastasis.   These scans were personally reviewed today and discussed with the patient again. His lymphadenopathy appeared to have decreased on those imaging studies.  He is currently on Xtandi 160 mg daily started in May 2016 continue to tolerated well. His PSA remains reasonably well at this time although slightly increasing in recent visits. The plan is to continue with the same dose and schedule and continue to monitor him. If his PSA starts to rise rapidly, we will restage him and you salvage therapy at that time.   2. Androgen depravation therapy: I recommended this to continue indefinitely. No complications related  to it noted.  3. Bone directed therapy: This is deferred at this time and will be utilized if he develops progressive bony metastasis.  4. Atrial fibrillation: He is currently on Eliquis. No bleeding complications noted since the last visit.   5. Follow-up: Will be in 6 weeks.  [facility], MD 9/7/201710:39 AM

## 2015-10-23 LAB — PSA: PROSTATE SPECIFIC AG, SERUM: 7.7 ng/mL — AB (ref 0.0–4.0)

## 2015-11-10 ENCOUNTER — Encounter: Payer: Self-pay | Admitting: *Deleted

## 2015-11-10 ENCOUNTER — Other Ambulatory Visit: Payer: Self-pay | Admitting: *Deleted

## 2015-11-10 DIAGNOSIS — C61 Malignant neoplasm of prostate: Secondary | ICD-10-CM

## 2015-11-10 MED ORDER — ENZALUTAMIDE 40 MG PO CAPS: 160.0000 mg | ORAL_CAPSULE | Freq: Every day | ORAL | 0 refills | Status: AC

## 2015-12-03 ENCOUNTER — Ambulatory Visit (HOSPITAL_BASED_OUTPATIENT_CLINIC_OR_DEPARTMENT_OTHER): Payer: Medicare Other | Admitting: Oncology

## 2015-12-03 ENCOUNTER — Other Ambulatory Visit (HOSPITAL_BASED_OUTPATIENT_CLINIC_OR_DEPARTMENT_OTHER): Payer: Medicare Other

## 2015-12-03 ENCOUNTER — Telehealth: Payer: Self-pay | Admitting: Oncology

## 2015-12-03 VITALS — BP 115/79 | HR 83 | Temp 97.7°F | Resp 17 | Ht 71.0 in | Wt 250.6 lb

## 2015-12-03 DIAGNOSIS — C7951 Secondary malignant neoplasm of bone: Secondary | ICD-10-CM | POA: Diagnosis not present

## 2015-12-03 DIAGNOSIS — E291 Testicular hypofunction: Secondary | ICD-10-CM | POA: Diagnosis not present

## 2015-12-03 DIAGNOSIS — C61 Malignant neoplasm of prostate: Secondary | ICD-10-CM | POA: Diagnosis not present

## 2015-12-03 DIAGNOSIS — I4891 Unspecified atrial fibrillation: Secondary | ICD-10-CM

## 2015-12-03 LAB — COMPREHENSIVE METABOLIC PANEL
ALBUMIN: 3.7 g/dL (ref 3.5–5.0)
ALK PHOS: 75 U/L (ref 40–150)
ALT: 14 U/L (ref 0–55)
AST: 17 U/L (ref 5–34)
Anion Gap: 10 mEq/L (ref 3–11)
BILIRUBIN TOTAL: 0.8 mg/dL (ref 0.20–1.20)
BUN: 20.4 mg/dL (ref 7.0–26.0)
CO2: 25 mEq/L (ref 22–29)
Calcium: 10 mg/dL (ref 8.4–10.4)
Chloride: 102 mEq/L (ref 98–109)
Creatinine: 1.1 mg/dL (ref 0.7–1.3)
EGFR: 63 mL/min/{1.73_m2} — AB (ref >90)
GLUCOSE: 110 mg/dL (ref 70–140)
Potassium: 4.8 mEq/L (ref 3.5–5.1)
SODIUM: 137 meq/L (ref 136–145)
TOTAL PROTEIN: 7.4 g/dL (ref 6.4–8.3)

## 2015-12-03 LAB — CBC WITH DIFFERENTIAL/PLATELET
BASO%: 0.4 % (ref 0.0–2.0)
Basophils Absolute: 0 10*3/uL (ref 0.0–0.1)
EOS ABS: 0.1 10*3/uL (ref 0.0–0.5)
EOS%: 0.9 % (ref 0.0–7.0)
HCT: 41.1 % (ref 38.4–49.9)
HEMOGLOBIN: 13.5 g/dL (ref 13.0–17.1)
LYMPH#: 1.8 10*3/uL (ref 0.9–3.3)
LYMPH%: 22.2 % (ref 14.0–49.0)
MCH: 30.7 pg (ref 27.2–33.4)
MCHC: 32.8 g/dL (ref 32.0–36.0)
MCV: 93.8 fL (ref 79.3–98.0)
MONO#: 0.8 10*3/uL (ref 0.1–0.9)
MONO%: 10.1 % (ref 0.0–14.0)
NEUT%: 66.4 % (ref 39.0–75.0)
NEUTROS ABS: 5.4 10*3/uL (ref 1.5–6.5)
Platelets: 276 10*3/uL (ref 140–400)
RBC: 4.38 10*6/uL (ref 4.20–5.82)
RDW: 13.3 % (ref 11.0–14.6)
WBC: 8.2 10*3/uL (ref 4.0–10.3)

## 2015-12-03 NOTE — Telephone Encounter (Signed)
Gave patient avs report and appointments for November. Central radiology will call re scan to be done at [facility].

## 2015-12-03 NOTE — Progress Notes (Signed)
Hematology and Oncology Follow Up Visit  [name] [name] [id number] [birth year] 75 y.o. 12/03/2015 10:22 AM [name], [name], [name], MD   Principle Diagnosis: 75 year old gentleman with prostate cancer diagnosed in 2010. He had a Gleason score of 3+4 = 7 and a PSA of 4.4. His disease was in 2 cores and the biopsy. 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   Prior Therapy: He was treated with external beam radiation completed in October 2010. His PSA remain under reasonable control to about 2014 when his PSA rose to 165 and subsequently was up to 202. He is imaging studies including a CT scan of the abdomen and pelvis which showed 07/15/2013 to have a 3.0 x 2.1 cm retroperitoneal adenopathy. A left retrocrural lymph node was measuring 1.1 cm. A bone scan at that time showed a single focus of abnormal uptake in the left posterior fourth rib. No clear CT correlation which could suggest trauma.He was started on Firmagon by Dr. Jeffie Pollock in May 2015 and his PSA dropped to 18 in October 2015. His PSA in January 2016 was down to 17.48 and in April 2016 up again to 48.52. Testosterone level was at a castrate level of 16. Staging workup continue to show metastatic bony disease without any major visceral involvement. He developed castration resistant disease with a rising PSA up to 48.  Current therapy:  Androgen deprivation under the care of Dr. Jeffie Pollock. Xtandi 160 mg daily started in May 2016.  Interim History:  Mr. [name] [name] presents today for a follow-up visit with his wife. Since his last visit, he reports feeling well and continues to exercise regularly. He has cut down a lot on his diet and has lost intentionally close to 10 pounds. He continues to enjoy excellent quality of life and performance status. He denied any bone pain or pathological fractures. He denied any decline in his energy or excessive fatigue.   He continues to take Xtandi at this time without any new  side effects. He does not report GI complaints of nausea or abdominal pain. Does not report lower extremity edema.  He has not reported any increased bony pain or pathological fractures.He denied any bleeding complications such as epistaxis, hematochezia or melena. He denied any seizure activity or neurological deficits.   He does not report any headaches, blurry vision, double vision or alteration of mental status. He does not report any fevers, chills, sweats or weight loss. He does not report any chest pain, palpitation, orthopnea or PND. Does not report any cough or shortness of breath. He does not report any nausea, vomiting, abdominal pain, change in his bowel habits. He does not report any urgency, hesitancy. Does not report any hematuria or dysuria. Rest of his review of systems unremarkable.  Medications: I have reviewed the patient's current medications.  Current Outpatient Prescriptions  Medication Sig Dispense Refill  . azelastine (ASTELIN) 0.1 % nasal spray Place 1 spray into both nostrils 2 (two) times daily. Use in each nostril as directed    . ELIQUIS 5 MG TABS tablet Take 1 tablet by mouth two  times daily 180 tablet 2  . enzalutamide (XTANDI) 40 MG capsule Take 4 capsules (160 mg total) by mouth daily. Take 4 capsules (160mg ) by  mouth every day 120 capsule 0  . fluticasone (FLONASE) 50 MCG/ACT nasal spray Place 2 sprays into both nostrils daily.     . hydrALAZINE (APRESOLINE) 100 MG tablet Take 100 mg by mouth 2 (two) times daily.     Marland Kitchen  latanoprost (XALATAN) 0.005 % ophthalmic solution Place 1 drop into both eyes at bedtime.     Marland Kitchen losartan-hydrochlorothiazide (HYZAAR) 100-12.5 MG per tablet Take 1 tablet by mouth daily.     . meclizine (ANTIVERT) 25 MG tablet Take 25 mg by mouth 3 (three) times daily as needed for dizziness.    . metFORMIN (GLUCOPHAGE) 500 MG tablet Take 500 mg by mouth daily with breakfast.     . metoprolol (LOPRESSOR) 50 MG tablet TAKE ONE TABLET BY MOUTH TWICE  A DAY 180 tablet 3  . simvastatin (ZOCOR) 20 MG tablet Take 20 mg by mouth daily at 6 PM.      No current facility-administered medications for this visit.      Allergies: No Known Allergies  Past Medical History, Surgical history, Social history, and Family History were reviewed and updated.   Physical Exam: Blood pressure 115/79, pulse 83, temperature 97.7 F (36.5 C), temperature source Oral, resp. rate 17, height 5\' 11"  (1.803 m), weight 250 lb 9.6 oz (113.7 kg), SpO2 99 %. ECOG: 1 General appearance: Alert, awake gentleman without distress. Head: Normocephalic, without obvious abnormality no oral thrush noted. Neck: no adenopathy no thyromegaly. Lymph nodes: Cervical, supraclavicular, and axillary nodes normal. Heart:regular rate and rhythm, S1, S2 normal, no murmur, click, rub or gallop Lung:chest clear, no wheezing, rales, normal symmetric air entry no dullness to percussion. Abdomin: soft, non-tender, without masses or organomegaly no rebound or guarding. EXT:no erythema, induration, or nodules   Lab Results: Lab Results  Component Value Date   WBC 8.2 12/03/2015   HGB 13.5 12/03/2015   HCT 41.1 12/03/2015   MCV 93.8 12/03/2015   PLT 276 12/03/2015     Chemistry      Component Value Date/Time   NA 137 10/22/2015 1008   K 4.2 10/22/2015 1008   CL 101 01/27/2015 1719   CO2 23 10/22/2015 1008   BUN 16.0 10/22/2015 1008   CREATININE 1.0 10/22/2015 1008      Component Value Date/Time   CALCIUM 9.5 10/22/2015 1008   ALKPHOS 73 10/22/2015 1008   AST 16 10/22/2015 1008   ALT 14 10/22/2015 1008   BILITOT 0.68 10/22/2015 1008     Results for [name] [name] (MRN [id number]) as of 12/03/2015 10:08  Ref. Range 06/18/2015 08:14 07/16/2015 10:08 09/01/2015 11:22 10/22/2015 10:08  PSA Latest Ref Range: 0.0 - 4.0 ng/mL 2.2 2.9 3.7 7.7 (H)     Assessment and Plan:   75 year old gentleman with:  1. Prostate cancer diagnosed in 2010. He had a Gleason score of 3+4 = 7 and  a PSA of 4.4. His disease was in 2 cores and the biopsy. He received external beam radiation as a definitive modality. He developed recurrent disease with retroperitoneal adenopathy and a bony metastasis. His PSA was up to 202. He was treated with androgen deprivation and PSA was down to 18. He subsequently developed castration resistant disease with PSA up to 53.   Staging workup including bone scan and CT scan in April 2016 showed multiple area of increased bony uptake in the bone scan. His CT scan did not show any visceral metastasis.    He is currently on Xtandi 160 mg daily started in May 2016 continue to tolerated well. His PSA started to rise in the last few months and most recently up to 7.7 with a doubling time of less than 3 months. The plan is to continue with Xtandi for at least one more month and repeat his  staging workup including CT scan and a bone scan. Depending on his next PSA as well as imaging studies different salvage therapy might be required. That would include Zytiga versus systemic chemotherapy.   2. Androgen depravation therapy: I recommended this to continue indefinitely. He has no objections to continuing it without complications.  3. Bone directed therapy: This is deferred at this time and will be utilized if he develops progressive bony metastasis.  4. Atrial fibrillation: He is currently on Eliquis. No bleeding complications noted since the last visit.   5. Follow-up: Will be on November 30th after imaging studies for staging purposes.  [id number], MD 10/19/201710:22 AM

## 2015-12-04 ENCOUNTER — Telehealth: Payer: Self-pay | Admitting: *Deleted

## 2015-12-04 LAB — PSA: Prostate Specific Ag, Serum: 15.3 ng/mL — ABNORMAL HIGH (ref 0.0–4.0)

## 2015-12-04 NOTE — Telephone Encounter (Signed)
Left message for patient to call me

## 2015-12-04 NOTE — Telephone Encounter (Signed)
-----   Message from [name], MD sent at 12/04/2015  8:42 AM EDT ----- Please let him know his PSA is up to 15.3. He needs to continue [facility] for now like we discussed

## 2015-12-04 NOTE — Progress Notes (Signed)
Spoke with patient, gave results of last PSA 

## 2015-12-14 ENCOUNTER — Other Ambulatory Visit: Payer: Self-pay | Admitting: *Deleted

## 2015-12-14 DIAGNOSIS — C61 Malignant neoplasm of prostate: Secondary | ICD-10-CM | POA: Insufficient documentation

## 2015-12-14 MED ORDER — ENZALUTAMIDE 40 MG PO CAPS: 160.0000 mg | ORAL_CAPSULE | Freq: Every day | ORAL | 0 refills | Status: DC

## 2015-12-17 ENCOUNTER — Telehealth: Payer: Self-pay | Admitting: *Deleted

## 2015-12-17 NOTE — Telephone Encounter (Signed)
FYI "I've not received appointment for bone scan. For November 28 th.  I live in [place], [place] and need to plan.  I'd like this done at [facility] but if I have to come to [place], I will."   Scan order noted yet referral reads incomplete.  Provided central scheduling phone number.

## 2016-01-04 ENCOUNTER — Telehealth: Payer: Self-pay | Admitting: *Deleted

## 2016-01-04 NOTE — Telephone Encounter (Signed)
Patient called back and left a voice mail message stating,"I gave you the wrong telephone number. Please call [phone]."

## 2016-01-04 NOTE — Telephone Encounter (Signed)
Left message for darlena clark in managed care that patient's scans have not been scheduled and he is to see dr Alen Blew 01/14/16. Please advise

## 2016-01-04 NOTE — Telephone Encounter (Signed)
Patient called and left a voice mail message stating,"I need to talk with Dr. Hazeline Junker nurse, either Erline Levine or [place], because my bone scan has not been scheduled, and I need to discuss my prescription. Please call [phone] and talk to Danna Hefty, she knows all of my medical information, if I am not available."

## 2016-01-05 NOTE — Telephone Encounter (Signed)
Spoke with [name] [name], in managed care. scans were approved, called central scheduling and asked that they call and schedule patient. Spoke with patient, his scans were set up at [facility], since he lives in [place].

## 2016-01-11 ENCOUNTER — Encounter (HOSPITAL_COMMUNITY): Payer: Self-pay

## 2016-01-11 ENCOUNTER — Encounter (HOSPITAL_COMMUNITY)
Admission: RE | Admit: 2016-01-11 | Discharge: 2016-01-11 | Disposition: A | Discharge status: Home / Self Care | Payer: Medicare Other | Source: Ambulatory Visit | Attending: Oncology | Admitting: Oncology

## 2016-01-11 ENCOUNTER — Ambulatory Visit (HOSPITAL_COMMUNITY)
Admission: RE | Admit: 2016-01-11 | Discharge: 2016-01-11 | Disposition: A | Discharge status: Home / Self Care | Payer: Medicare Other | Source: Ambulatory Visit | Attending: Oncology | Admitting: Oncology

## 2016-01-11 DIAGNOSIS — C7951 Secondary malignant neoplasm of bone: Secondary | ICD-10-CM | POA: Diagnosis not present

## 2016-01-11 DIAGNOSIS — I700 Atherosclerosis of aorta: Secondary | ICD-10-CM | POA: Diagnosis not present

## 2016-01-11 DIAGNOSIS — C61 Malignant neoplasm of prostate: Secondary | ICD-10-CM | POA: Diagnosis present

## 2016-01-11 DIAGNOSIS — N200 Calculus of kidney: Secondary | ICD-10-CM | POA: Diagnosis not present

## 2016-01-11 DIAGNOSIS — R948 Abnormal results of function studies of other organs and systems: Secondary | ICD-10-CM | POA: Diagnosis not present

## 2016-01-11 LAB — POCT I-STAT CREATININE: CREATININE: 1.1 mg/dL (ref 0.61–1.24)

## 2016-01-11 MED ORDER — IOPAMIDOL (ISOVUE-300) INJECTION 61%
100.0000 mL | Freq: Once | INTRAVENOUS | Status: AC | PRN
  Administered 2016-01-11: 100 mL via INTRAVENOUS

## 2016-01-11 MED ORDER — TECHNETIUM TC 99M MEDRONATE IV KIT
25.0000 | PACK | Freq: Once | INTRAVENOUS | Status: AC | PRN
  Administered 2016-01-11: 19.1 via INTRAVENOUS

## 2016-01-14 ENCOUNTER — Other Ambulatory Visit (HOSPITAL_BASED_OUTPATIENT_CLINIC_OR_DEPARTMENT_OTHER): Payer: Medicare Other

## 2016-01-14 ENCOUNTER — Telehealth: Payer: Self-pay | Admitting: Pharmacist

## 2016-01-14 ENCOUNTER — Ambulatory Visit (HOSPITAL_BASED_OUTPATIENT_CLINIC_OR_DEPARTMENT_OTHER): Payer: Medicare Other | Admitting: Oncology

## 2016-01-14 ENCOUNTER — Telehealth: Payer: Self-pay | Admitting: Oncology

## 2016-01-14 ENCOUNTER — Other Ambulatory Visit: Payer: Self-pay | Admitting: *Deleted

## 2016-01-14 VITALS — BP 123/75 | HR 72 | Temp 97.7°F | Resp 18 | Ht 71.0 in | Wt 235.3 lb

## 2016-01-14 DIAGNOSIS — C61 Malignant neoplasm of prostate: Secondary | ICD-10-CM

## 2016-01-14 DIAGNOSIS — I10 Essential (primary) hypertension: Secondary | ICD-10-CM

## 2016-01-14 DIAGNOSIS — C7951 Secondary malignant neoplasm of bone: Secondary | ICD-10-CM

## 2016-01-14 DIAGNOSIS — I4891 Unspecified atrial fibrillation: Secondary | ICD-10-CM

## 2016-01-14 DIAGNOSIS — E876 Hypokalemia: Secondary | ICD-10-CM

## 2016-01-14 LAB — CBC WITH DIFFERENTIAL/PLATELET
BASO%: 0.1 % (ref 0.0–2.0)
BASOS ABS: 0 10*3/uL (ref 0.0–0.1)
EOS ABS: 0 10*3/uL (ref 0.0–0.5)
EOS%: 0.5 % (ref 0.0–7.0)
HEMATOCRIT: 36.3 % — AB (ref 38.4–49.9)
HEMOGLOBIN: 12.5 g/dL — AB (ref 13.0–17.1)
LYMPH#: 1.4 10*3/uL (ref 0.9–3.3)
LYMPH%: 18.3 % (ref 14.0–49.0)
MCH: 31 pg (ref 27.2–33.4)
MCHC: 34.4 g/dL (ref 32.0–36.0)
MCV: 90.1 fL (ref 79.3–98.0)
MONO#: 0.7 10*3/uL (ref 0.1–0.9)
MONO%: 8.6 % (ref 0.0–14.0)
NEUT#: 5.7 10*3/uL (ref 1.5–6.5)
NEUT%: 72.5 % (ref 39.0–75.0)
PLATELETS: 258 10*3/uL (ref 140–400)
RBC: 4.03 10*6/uL — ABNORMAL LOW (ref 4.20–5.82)
RDW: 13.2 % (ref 11.0–14.6)
WBC: 7.8 10*3/uL (ref 4.0–10.3)

## 2016-01-14 LAB — COMPREHENSIVE METABOLIC PANEL
ALBUMIN: 3.5 g/dL (ref 3.5–5.0)
ALK PHOS: 74 U/L (ref 40–150)
ALT: 14 U/L (ref 0–55)
ANION GAP: 8 meq/L (ref 3–11)
AST: 16 U/L (ref 5–34)
BUN: 13.1 mg/dL (ref 7.0–26.0)
CALCIUM: 9.4 mg/dL (ref 8.4–10.4)
CO2: 22 mEq/L (ref 22–29)
Chloride: 102 mEq/L (ref 98–109)
Creatinine: 1 mg/dL (ref 0.7–1.3)
EGFR: 70 mL/min/{1.73_m2} — ABNORMAL LOW (ref >90)
Glucose: 98 mg/dl (ref 70–140)
POTASSIUM: 4.4 meq/L (ref 3.5–5.1)
Sodium: 132 mEq/L — ABNORMAL LOW (ref 136–145)
Total Bilirubin: 0.71 mg/dL (ref 0.20–1.20)
Total Protein: 6.7 g/dL (ref 6.4–8.3)

## 2016-01-14 MED ORDER — ABIRATERONE ACETATE 250 MG PO TABS: 1000.0000 mg | ORAL_TABLET | Freq: Every day | ORAL | 0 refills | Status: DC

## 2016-01-14 MED ORDER — PREDNISONE 5 MG PO TABS: 5.0000 mg | ORAL_TABLET | Freq: Every day | ORAL | 3 refills | Status: DC

## 2016-01-14 NOTE — Progress Notes (Signed)
Hematology and Oncology Follow Up Visit  [name] ARENDT [id number] 1940/08/18 75 y.o. 01/14/2016 10:27 AM [name] [name], [name], [name], MD   Principle Diagnosis: 75 year old gentleman with prostate cancer diagnosed in 2010. He had a Gleason score of 3+4 = 7 and a PSA of 4.4. His disease was in 2 cores and the biopsy. 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   Prior Therapy: He was treated with external beam radiation completed in October 2010. His PSA remain under reasonable control to about 2014 when his PSA rose to 165 and subsequently was up to 202. He is imaging studies including a CT scan of the abdomen and pelvis which showed 07/15/2013 to have a 3.0 x 2.1 cm retroperitoneal adenopathy. A left retrocrural lymph node was measuring 1.1 cm. A bone scan at that time showed a single focus of abnormal uptake in the left posterior fourth rib. No clear CT correlation which could suggest trauma.He was started on Firmagon by Dr. Jeffie Pollock in May 2015 and his PSA dropped to 18 in October 2015. His PSA in January 2016 was down to 17.48 and in April 2016 up again to 48.52. Testosterone level was at a castrate level of 16. Staging workup continue to show metastatic bony disease without any major visceral involvement. He developed castration resistant disease with a rising PSA up to 48. Xtandi 160 mg daily started in May 2016. Therapy discontinued in November 2017 because of progression of disease.  Current therapy:  Androgen deprivation under the care of Dr. Jeffie Pollock. Under consideration to start different salvage therapy.  Interim History:  [name] [name] presents today for a follow-up visit with his wife. Since his last visit, he was hospitalized 2 weeks ago for hypotension and dehydration. He started developing symptoms of weakness and was noted to be hypotensive. He was hospitalized briefly and was hydrated aggressively and his antihypertensive medications were changed. He was  on hydrochlorothiazide diuretic which was discontinued. He remained almost certain at this time. Since his discharge, he is improving slowly and have regained a lot of his activities of daily living. His appetite is improving after losing a close 15 pounds.  He continues to take Xtandi at this time without any new side effects. He does not report GI complaints of nausea or abdominal pain. Does not report lower extremity edema.  He has not reported any increased bony pain or pathological fractures.  He denied any seizure activity or neurological deficits. He denied any falls or syncope.   He does not report any headaches, blurry vision, double vision or alteration of mental status. He does not report any fevers, chills, sweats or weight loss. He does not report any chest pain, palpitation, orthopnea or PND. Does not report any cough or shortness of breath. He does not report any nausea, vomiting, abdominal pain, change in his bowel habits. He does not report any urgency, hesitancy. Does not report any hematuria or dysuria. Rest of his review of systems unremarkable.  Medications: I have reviewed the patient's current medications.  Current Outpatient Prescriptions  Medication Sig Dispense Refill  . azelastine (ASTELIN) 0.1 % nasal spray Place 1 spray into both nostrils 2 (two) times daily. Use in each nostril as directed    . ELIQUIS 5 MG TABS tablet Take 1 tablet by mouth two  times daily 180 tablet 2  . fluticasone (FLONASE) 50 MCG/ACT nasal spray Place 2 sprays into both nostrils daily.     . hydrALAZINE (APRESOLINE) 50 MG tablet  Take 50 mg by mouth 2 (two) times daily.    Marland Kitchen latanoprost (XALATAN) 0.005 % ophthalmic solution Place 1 drop into both eyes at bedtime.     . meclizine (ANTIVERT) 25 MG tablet Take 25 mg by mouth 3 (three) times daily as needed for dizziness.    . metFORMIN (GLUCOPHAGE) 500 MG tablet Take 500 mg by mouth daily with breakfast.     . metoprolol (LOPRESSOR) 50 MG tablet TAKE  ONE TABLET BY MOUTH TWICE A DAY 180 tablet 3  . simvastatin (ZOCOR) 20 MG tablet Take 20 mg by mouth daily at 6 PM.     . abiraterone Acetate (ZYTIGA) 250 MG tablet Take 4 tablets (1,000 mg total) by mouth daily. Take on an empty stomach 1 hour before or 2 hours after a meal 120 tablet 0  . predniSONE (DELTASONE) 5 MG tablet Take 1 tablet (5 mg total) by mouth daily with breakfast. 90 tablet 3   No current facility-administered medications for this visit.      Allergies: No Known Allergies  Past Medical History, Surgical history, Social history, and Family History were reviewed and updated.   Physical Exam: Blood pressure 123/75, pulse 72, temperature 97.7 F (36.5 C), temperature source Oral, resp. rate 18, height 5\' 11"  (1.803 m), weight 235 lb 4.8 oz (106.7 kg), SpO2 100 %. ECOG: 1 General appearance: Well-appearing gentleman without distress. Head: Normocephalic, without obvious abnormality no oral ulcers or lesions. Neck: no adenopathy no thyromegaly. Lymph nodes: Cervical, supraclavicular, and axillary nodes normal. Heart:regular rate and rhythm, S1, S2 normal, no murmur, click, rub or gallop Lung:chest clear, no wheezing, rales, normal symmetric air entry no dullness to percussion. Abdomin: soft, non-tender, without masses or organomegaly no shifting dullness or ascites. EXT:no erythema, induration, or nodules   Lab Results: Lab Results  Component Value Date   WBC 7.8 01/14/2016   HGB 12.5 (L) 01/14/2016   HCT 36.3 (L) 01/14/2016   MCV 90.1 01/14/2016   PLT 258 01/14/2016     Chemistry      Component Value Date/Time   NA 132 (L) 01/14/2016 0939   K 4.4 01/14/2016 0939   CL 101 01/27/2015 1719   CO2 22 01/14/2016 0939   BUN 13.1 01/14/2016 0939   CREATININE 1.0 01/14/2016 0939      Component Value Date/Time   CALCIUM 9.4 01/14/2016 0939   ALKPHOS 74 01/14/2016 0939   AST 16 01/14/2016 0939   ALT 14 01/14/2016 0939   BILITOT 0.71 01/14/2016 0939       Results for [name] [name] (MRN [id number]) as of 01/14/2016 10:30  Ref. Range 10/22/2015 10:08 12/03/2015 09:52  PSA Latest Ref Range: 0.0 - 4.0 ng/mL 7.7 (H) 15.3 (H)    EXAM: CT ABDOMEN AND PELVIS WITH CONTRAST  TECHNIQUE: Multidetector CT imaging of the abdomen and pelvis was performed using the standard protocol following bolus administration of intravenous contrast.  CONTRAST:  15mL ISOVUE-300 IOPAMIDOL (ISOVUE-300) INJECTION 61%  COMPARISON:  06/03/2014  FINDINGS: Lower chest: The lung bases are clear. The heart size appears enlarged. There is aortic atherosclerosis and calcifications within the RCA and LAD coronary artery.  Hepatobiliary: No focal liver abnormality is seen. No gallstones, gallbladder wall thickening, or biliary dilatation.  Pancreas: Unremarkable. No pancreatic ductal dilatation or surrounding inflammatory changes.  Spleen: Normal in size without focal abnormality.  Adrenals/Urinary Tract: Normal appearance of the adrenal glands. Mild bilateral renal cortical atrophy identified. Hypodense lesion within the anterior cortex of the left kidney is  favored to represent a small cyst. This is unchanged when compared with 06/03/2014 measuring 1.1 cm, image 24 of series 7. Small bilateral renal calculi are identified. The largest stone is in the inferior pole of the left kidney measuring 5 mm. The urinary bladder is partially collapsed.  Stomach/Bowel: Stomach is within normal limits. Appendix appears normal. No evidence of bowel wall thickening, distention, or inflammatory changes.  Vascular/Lymphatic: Aortic atherosclerosis. No aneurysm. No upper abdominal adenopathy. No pelvic or inguinal adenopathy identified.  Reproductive: Fiducial markers are identified within the prostate gland.  Other: Periumbilical hernia is identified which contains fat only, image 62 of series 2.  Musculoskeletal: Multifocal areas of sclerotic bone  metastasis are again noted there is a lesion in the left iliac bone measuring 1.2 cm, image 72 of series 2. Increased from 0.8 cm previously. A second index lesion is identified within the left iliac wing measuring 1.9 cm, image 63 of series 2. Previously this measured 1.3 cm. Index lesion within the posterior right iliac bone measures 2.6 cm, image 61 of series 2. Previously 2 cm. Interval progression of multifocal sclerotic metastasis throughout the lower thoracic and lumbar spine.  IMPRESSION: 1. Interval progression of multifocal sclerotic bone metastases. 2. No evidence for nodal metastasis or solid organ involvement. 3. Aortic atherosclerosis 4. Bilateral nephrolithiasis   EXAM: NUCLEAR MEDICINE WHOLE BODY BONE SCAN  TECHNIQUE: Whole body anterior and posterior images were obtained approximately 3 hours after intravenous injection of radiopharmaceutical.  RADIOPHARMACEUTICALS:  19.1 MCi Technetium-18m MDP IV  COMPARISON:  06/03/2014 .  FINDINGS: Bilateral renal function excretion. Increased number and intensity of areas of increased activity noted throughout the thoracic spine, lumbar spine, ribs. These findings are consistent with metastatic disease. Persist areas of increased activity about the pelvis/SI joints, unchanged. Areas increased activity about both shoulders and right knee as well as both feet again noted, these changes are most likely degenerative.  IMPRESSION: Increased number and intensity of areas of increased activity noted throughout the thoracic spine, lumbar spine, ribs. These findings are consistent with progressive metastatic disease.     Assessment and Plan:   75 year old gentleman with:  1. Prostate cancer diagnosed in 2010. He had a Gleason score of 3+4 = 7 and a PSA of 4.4. His disease was in 2 cores and the biopsy. He received external beam radiation as a definitive modality. He developed recurrent disease with retroperitoneal  adenopathy and a bony metastasis. His PSA was up to 202. He was treated with androgen deprivation and PSA was down to 18. He subsequently developed castration resistant disease with PSA up to 25.   Staging workup including bone scan and CT scan in April 2016 showed multiple area of increased bony uptake in the bone scan. His CT scan did not show any visceral metastasis.   He is currently on Xtandi 160 mg daily started in May 2016 continue to tolerated well.    His PSA on 01/11/2016 increased to 15.3. CT scan of the abdomen and pelvis also obtained at that time and showed no evidence of visceral metastasis. His bone scan was personally reviewed as well which showed mild progression of disease.  Given these findings, I have recommended discontinuation of Xtandi and used different salvage therapy. Options of therapy were reviewed again today including Zytiga, Xofigo and systemic chemotherapy. Risks and benefits of all these options were reviewed again and he is willing to proceed with Zytiga. Side effects associated with this medication include nausea, fatigue, lower extremity edema, hypokalemia and  adrenal insufficiency. I have recommended a dose of 1000 mg daily with prednisone at 5 mg daily. The benefit would be improvement in his PSA, progression free survival and overall survival in the setting of castration resistant metastatic prostate cancer. He is agreeable to proceed at this time.    2. Androgen depravation therapy: I recommended this to continue indefinitely. He has no objections to continuing it without complications.  3. Bone directed therapy: This will be addressed moving forward given his progression of disease. He will need to obtain dental clearance before the start of therapy.  4. Atrial fibrillation: He is currently on Eliquis. No bleeding complications noted since the last visit.   5. Hypertension: He has been on hydrochlorothiazide which has been discontinued. With the start of  Zytiga, this might exacerbate his high blood pressure and we'll need to continue to monitor that as well as electrolytes.  6. Hypokalemia: We will monitor his electrolytes on a regular basis. His potassium is 4.4 on November 30.  7. Liver function surveillance: His baseline liver function test is normal. These will be monitored periodically on Zytiga.  8. Follow-up: Will be in 4 weeks to discuss complications associated with Zytiga and prednisone.  [id number], MD 11/30/201710:27 AM

## 2016-01-14 NOTE — Telephone Encounter (Signed)
Appointments scheduled per 11/30 LOS. Patient given AVS report and calendars with future scheduled appointments. °

## 2016-01-14 NOTE — Telephone Encounter (Signed)
Oral Chemotherapy Pharmacist Encounter  Received new prescription for Zytiga for patient. Labs from today (11/30) assessed, OK for treatment. Current medication list in Epic reviewed, noted DDI with metoprolol and Zytiga, Zytiga will decrease clearance of metoprolol and may increase metoprolol exposure. No changes to current therapy indicated at present time, case will be discussed with MD and patient.  Noted patient has [place] [employer] care as prescription insurance coverage. Due to this, prescription will be sent to [facility] for processing due to their relationship with [facility] and [employer].  I will ask Dr. Hazeline Junker desk RN to e-scribe [facility] to [facility] in [place], [place]. I will fax demographic info, medication list, insurance info, and last MD note to [phone].  [facility] will continue to follow.  Johny Drilling, PharmD, BCPS, BCOP 01/14/2016  2:25 PM Oral Oncology Clinic [phone]

## 2016-01-15 ENCOUNTER — Telehealth: Payer: Self-pay | Admitting: *Deleted

## 2016-01-15 LAB — PSA: PROSTATE SPECIFIC AG, SERUM: 28 ng/mL — AB (ref 0.0–4.0)

## 2016-01-15 NOTE — Telephone Encounter (Signed)
-----   Message from [name], MD sent at 01/15/2016  9:45 AM EST ----- Please let him know his PSA is up. That's why we switch to Zytiga.

## 2016-01-15 NOTE — Telephone Encounter (Signed)
Spoke with patient, let him know that his PSA was up, that is why he was switched to [facility]. Johny Drilling pharmacist, is working with [facility] for medication.

## 2016-01-18 NOTE — Telephone Encounter (Signed)
Oral Chemotherapy Pharmacist Encounter  I called [facility] at [phone] to follow up on status of Zytiga Rx. I was told they had not even started processing prescription. They will start now. I called Fredrich Birks, Onoclogy Regional Account Manager for [place], [place] site, on her cell at [phone] and informed her of delay. She will contact the pharmacy immediately to start Rx processing.  [facility] Clinic will continue to follow.  Johny Drilling, PharmD, BCPS, BCOP 01/18/2016  1:33 PM Oral Oncology Clinic [phone]

## 2016-01-22 ENCOUNTER — Encounter: Payer: Self-pay | Admitting: Oncology

## 2016-01-22 NOTE — Progress Notes (Signed)
[facility] approved Zytiga tab 250mg  through 02/13/17.

## 2016-01-27 ENCOUNTER — Other Ambulatory Visit: Payer: Self-pay | Admitting: *Deleted

## 2016-01-27 MED ORDER — ABIRATERONE ACETATE 250 MG PO TABS: 1000.0000 mg | ORAL_TABLET | Freq: Every day | ORAL | 0 refills | Status: DC

## 2016-02-17 ENCOUNTER — Other Ambulatory Visit (HOSPITAL_BASED_OUTPATIENT_CLINIC_OR_DEPARTMENT_OTHER): Payer: Medicare Other

## 2016-02-17 ENCOUNTER — Telehealth: Payer: Self-pay | Admitting: Oncology

## 2016-02-17 ENCOUNTER — Ambulatory Visit (HOSPITAL_BASED_OUTPATIENT_CLINIC_OR_DEPARTMENT_OTHER): Payer: Medicare Other | Admitting: Oncology

## 2016-02-17 VITALS — BP 125/72 | HR 110 | Temp 97.5°F | Resp 18 | Ht 71.0 in | Wt 233.9 lb

## 2016-02-17 DIAGNOSIS — C61 Malignant neoplasm of prostate: Secondary | ICD-10-CM

## 2016-02-17 DIAGNOSIS — C7951 Secondary malignant neoplasm of bone: Secondary | ICD-10-CM

## 2016-02-17 DIAGNOSIS — E876 Hypokalemia: Secondary | ICD-10-CM

## 2016-02-17 DIAGNOSIS — I10 Essential (primary) hypertension: Secondary | ICD-10-CM

## 2016-02-17 DIAGNOSIS — I4891 Unspecified atrial fibrillation: Secondary | ICD-10-CM | POA: Diagnosis not present

## 2016-02-17 DIAGNOSIS — E291 Testicular hypofunction: Secondary | ICD-10-CM | POA: Diagnosis not present

## 2016-02-17 LAB — CBC WITH DIFFERENTIAL/PLATELET
BASO%: 0.4 % (ref 0.0–2.0)
Basophils Absolute: 0 10*3/uL (ref 0.0–0.1)
EOS ABS: 0.1 10*3/uL (ref 0.0–0.5)
EOS%: 1.8 % (ref 0.0–7.0)
HCT: 33.8 % — ABNORMAL LOW (ref 38.4–49.9)
HEMOGLOBIN: 11.2 g/dL — AB (ref 13.0–17.1)
LYMPH%: 18.2 % (ref 14.0–49.0)
MCH: 30.9 pg (ref 27.2–33.4)
MCHC: 33.1 g/dL (ref 32.0–36.0)
MCV: 93.1 fL (ref 79.3–98.0)
MONO#: 0.6 10*3/uL (ref 0.1–0.9)
MONO%: 13.3 % (ref 0.0–14.0)
NEUT%: 66.3 % (ref 39.0–75.0)
NEUTROS ABS: 3 10*3/uL (ref 1.5–6.5)
PLATELETS: 171 10*3/uL (ref 140–400)
RBC: 3.63 10*6/uL — ABNORMAL LOW (ref 4.20–5.82)
RDW: 13.6 % (ref 11.0–14.6)
WBC: 4.5 10*3/uL (ref 4.0–10.3)
lymph#: 0.8 10*3/uL — ABNORMAL LOW (ref 0.9–3.3)

## 2016-02-17 LAB — COMPREHENSIVE METABOLIC PANEL
ALBUMIN: 3.4 g/dL — AB (ref 3.5–5.0)
ALK PHOS: 74 U/L (ref 40–150)
ALT: 14 U/L (ref 0–55)
AST: 17 U/L (ref 5–34)
Anion Gap: 9 mEq/L (ref 3–11)
BILIRUBIN TOTAL: 0.58 mg/dL (ref 0.20–1.20)
BUN: 13.6 mg/dL (ref 7.0–26.0)
CO2: 26 meq/L (ref 22–29)
CREATININE: 0.9 mg/dL (ref 0.7–1.3)
Calcium: 8.9 mg/dL (ref 8.4–10.4)
Chloride: 101 mEq/L (ref 98–109)
EGFR: 85 mL/min/{1.73_m2} — AB (ref >90)
GLUCOSE: 94 mg/dL (ref 70–140)
Potassium: 3.4 mEq/L — ABNORMAL LOW (ref 3.5–5.1)
SODIUM: 137 meq/L (ref 136–145)
TOTAL PROTEIN: 6.2 g/dL — AB (ref 6.4–8.3)

## 2016-02-17 NOTE — Progress Notes (Signed)
Hematology and Oncology Follow Up Visit  [name] [name] [id number] 1940-10-25 76 y.o. 02/17/2016 10:45 AM [name] [name], [name], [name], MD   Principle Diagnosis: 76 year old gentleman with prostate cancer diagnosed in 2010. He had a Gleason score of 3+4 = 7 and a PSA of 4.4. His disease was in 2 cores and the biopsy. 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   Prior Therapy: He was treated with external beam radiation completed in October 2010. His PSA remain under reasonable control to about 2014 when his PSA rose to 165 and subsequently was up to 202. He is imaging studies including a CT scan of the abdomen and pelvis which showed 07/15/2013 to have a 3.0 x 2.1 cm retroperitoneal adenopathy. A left retrocrural lymph node was measuring 1.1 cm. A bone scan at that time showed a single focus of abnormal uptake in the left posterior fourth rib. No clear CT correlation which could suggest trauma.He was started on Firmagon by Dr. Jeffie [name] in May 2015 and his PSA dropped to 18 in October 2015. His PSA in January 2016 was down to 17.48 and in April 2016 up again to 48.52. Testosterone level was at a castrate level of 16. Staging workup continue to show metastatic bony disease without any major visceral involvement. He developed castration resistant disease with a rising PSA up to 48. Xtandi 160 mg daily started in May 2016. Therapy discontinued in November 2017 because of progression of disease.  Current therapy:  Androgen deprivation under the care of Dr. Jeffie [name]. Zytiga 1000 mg daily with prednisone started in December 2017.   Interim History:  Mr. [name] [name] presents today for a follow-up visit with his wife. Since his last visit, he started [place] and took it for the last month and tolerated it well. He denied any excessive fatigue, weight loss or edema. His energy is improving and his appetite have also improved since his recent hospitalization. His performance status and  activity level is back to baseline and planning to resume exercise in the next week or so. He denied any pathological fractures, falls or syncope.   He does not report any headaches, blurry vision, double vision or alteration of mental status. He does not report any fevers, chills, sweats or weight loss. He does not report any chest pain, palpitation, orthopnea or PND. Does not report any cough or shortness of breath. He does not report any nausea, vomiting, abdominal pain, change in his bowel habits. He does not report any urgency, hesitancy. Does not report any hematuria or dysuria. Rest of his review of systems unremarkable.  Medications: I have reviewed the patient's current medications.  Current Outpatient Prescriptions  Medication Sig Dispense Refill  . abiraterone Acetate (ZYTIGA) 250 MG tablet Take 4 tablets (1,000 mg total) by mouth daily. Take on an empty stomach 1 hour before or 2 hours after a meal 120 tablet 0  . azelastine (ASTELIN) 0.1 % nasal spray Place 1 spray into both nostrils 2 (two) times daily. Use in each nostril as directed    . ELIQUIS 5 MG TABS tablet Take 1 tablet by mouth two  times daily 180 tablet 2  . fluticasone (FLONASE) 50 MCG/ACT nasal spray Place 2 sprays into both nostrils daily.     . hydrALAZINE (APRESOLINE) 50 MG tablet Take 50 mg by mouth 2 (two) times daily.    Marland Kitchen latanoprost (XALATAN) 0.005 % ophthalmic solution Place 1 drop into both eyes at bedtime.     Marland Kitchen losartan (  COZAAR) 50 MG tablet     . meclizine (ANTIVERT) 25 MG tablet Take 25 mg by mouth 3 (three) times daily as needed for dizziness.    . metFORMIN (GLUCOPHAGE) 500 MG tablet Take 500 mg by mouth daily with breakfast.     . metoprolol (LOPRESSOR) 50 MG tablet TAKE ONE TABLET BY MOUTH TWICE A DAY 180 tablet 3  . predniSONE (DELTASONE) 5 MG tablet Take 1 tablet (5 mg total) by mouth daily with breakfast. 90 tablet 3  . simvastatin (ZOCOR) 20 MG tablet Take 20 mg by mouth daily at 6 PM.      No  current facility-administered medications for this visit.      Allergies: No Known Allergies  Past Medical History, Surgical history, Social history, and Family History were reviewed and updated.   Physical Exam: Blood pressure 125/72, pulse (!) 110, temperature 97.5 F (36.4 C), temperature source Oral, resp. rate 18, height 5\' 11"  (1.803 m), weight 233 lb 14.4 oz (106.1 kg), SpO2 100 %, peak flow (!) 0 L/min. ECOG: 1 General appearance: Alert, awake gentleman without distress. Head: Normocephalic, without obvious abnormality no oral thrush noted. Neck: no adenopathy no thyromegaly. Lymph nodes: Cervical, supraclavicular, and axillary nodes normal. Heart:regular rate and rhythm, S1, S2 normal, no murmur, click, rub or gallop Lung:chest clear, no wheezing, rales, normal symmetric air entry no dullness to percussion. Abdomin: soft, non-tender, without masses or organomegaly no shifting dullness or ascites. EXT:no erythema, induration, or nodules   Lab Results: Lab Results  Component Value Date   WBC 4.5 02/17/2016   HGB 11.2 (L) 02/17/2016   HCT 33.8 (L) 02/17/2016   MCV 93.1 02/17/2016   PLT 171 02/17/2016     Chemistry      Component Value Date/Time   NA 132 (L) 01/14/2016 0939   K 4.4 01/14/2016 0939   CL 101 01/27/2015 1719   CO2 22 01/14/2016 0939   BUN 13.1 01/14/2016 0939   CREATININE 1.0 01/14/2016 0939      Component Value Date/Time   CALCIUM 9.4 01/14/2016 0939   ALKPHOS 74 01/14/2016 0939   AST 16 01/14/2016 0939   ALT 14 01/14/2016 0939   BILITOT 0.71 01/14/2016 0939        Assessment and Plan:   76 year old gentleman with:  1. Prostate cancer diagnosed in 2010. He had a Gleason score of 3+4 = 7 and a PSA of 4.4. His disease was in 2 cores and the biopsy. He received external beam radiation as a definitive modality. He developed recurrent disease with retroperitoneal adenopathy and a bony metastasis. His PSA was up to 202. He was treated with  androgen deprivation and PSA was down to 18. He subsequently developed castration resistant disease with PSA up to 31.   Staging workup including bone scan and CT scan in April 2016 showed multiple area of increased bony uptake in the bone scan. His CT scan did not show any visceral metastasis.   He is status post Xtandi 160 mg daily started in May 2016 with therapy discontinued in December 2017 because progression of disease. PSA at that time was 28.0.  He is currently on Zytiga that was started in December 2017 and has been well tolerated. Last continue with the same dose and schedule and monitor his PSA closely.    2. Androgen depravation therapy: I recommended this to continue indefinitely. No issues reported at this time.  3. Bone directed therapy: This will be addressed moving forward given his progression  of disease. He will need to obtain dental clearance before the start of therapy.  4. Atrial fibrillation: He is currently on Eliquis. No bleeding issues noted.  5. Hypertension: He has been on hydrochlorothiazide which has been discontinued. His blood pressure remained under excellent control.  6. Hypokalemia:. His potassium is 3.4 without replacement. We'll continue to monitor.  7. Liver function surveillance: His function test continues to be within normal range.  8. Follow-up: Will be in 4 weeks to discuss complications associated with Zytiga and prednisone.  [facility], MD 1/3/201810:45 AM

## 2016-02-17 NOTE — Telephone Encounter (Signed)
Appointments scheduled per 1/3 LOS. Patient given AVS report and calendars with future scheduled appointments. °

## 2016-02-18 LAB — PSA: PROSTATE SPECIFIC AG, SERUM: 23.9 ng/mL — AB (ref 0.0–4.0)

## 2016-02-18 NOTE — Progress Notes (Signed)
Spoke with patient, gave results of last PSA 

## 2016-02-25 ENCOUNTER — Other Ambulatory Visit: Payer: Self-pay | Admitting: Cardiovascular Disease

## 2016-02-26 ENCOUNTER — Other Ambulatory Visit: Payer: Self-pay | Admitting: *Deleted

## 2016-02-26 MED ORDER — ABIRATERONE ACETATE 250 MG PO TABS: 1000.0000 mg | ORAL_TABLET | Freq: Every day | ORAL | 0 refills | Status: DC

## 2016-03-09 ENCOUNTER — Encounter: Payer: Self-pay | Admitting: Cardiovascular Disease

## 2016-03-09 ENCOUNTER — Ambulatory Visit (INDEPENDENT_AMBULATORY_CARE_PROVIDER_SITE_OTHER): Payer: Medicare Other | Admitting: Cardiovascular Disease

## 2016-03-09 VITALS — BP 162/108 | HR 91 | Ht 71.0 in | Wt 232.8 lb

## 2016-03-09 DIAGNOSIS — I482 Chronic atrial fibrillation, unspecified: Secondary | ICD-10-CM

## 2016-03-09 DIAGNOSIS — I10 Essential (primary) hypertension: Secondary | ICD-10-CM | POA: Diagnosis not present

## 2016-03-09 NOTE — Progress Notes (Signed)
Kathalene Frames [place] Date of Birth  15-Dec-1940       [facility] [address], [address], [place], [place]  [address]   [place], [place]  [address] [address]   Fax  [phone]     Fax [phone]  Problem List: 1. Atrial fibrillation 2. Hypertension 3. Diabetes mellitus 4. Hyperlipidemia    [name] [name] presents today for newly discovered atrial fibrillation. He was sent to our office for a urology EKG. He was found to  have atrial fibrillation and was worked into my schedule for further evaluation.  His Primary medical doctor is Dalia Heading, MD ( [place] ).  He does to see his medical doctor on a regular basis and has never been told about atrial fib.   He has no symptoms of atrial fibrillation. Cannot tell that his heartbeat is irregular. He exercises-walks 2 miles every day. He may have a little shortness breath when he walks up hills but this is very temporary.  He denies any chest pain, syncope, presyncope. He denies any PND or orthopnea.  CHADS VASC2  Score is 3 which corresponds to a high risk of stroke in atrial fibrillation.  Yearly risk of stroke without warfarin treatment is estimated at 3.2.    August 26, 2013:  Shaine is doing well - walking 2 miles a day most days of the week.   Occasionally can feel that his heart is irregular but is not .  Jan. 13, 2016:  Naszier is a 76 yo with hx of chronic atrial flutter ablation, diabetes mellitus, and hypertension. He has gained some weight since his last visit BP is a bit elevated.  BP is always better at home.  No CP  , dyspnea. Still walking 2 miles a day.  September 01, 2014: Kelcey is doing well.  Walking every day .  No CP   Jan. 23, 2017:  doing well from a cardiac standpoint.  PSA has been going down.   Still very active, no CP or dyspena.  We have not attempted a cardioversion and he does not want to have one at this point  . He is completely asymptomatic.   Jan. 24, 2018:  Was in the hospital in [place], [place]  2 months ago .  Was very dehydrated.      His HCTZ was stopped ,  Gave him IVF Had lost his appetite ( related to his prostate cancer drug)   Is back walking again .  Is regaining his strength.  BP is back up today .   He says the BP fluctuates.  Has lost 42 lbs.  BP at home and other offices have been normal .  Is on a new cancer med ( Zitiga) and is now on Prednisone    Current Outpatient Prescriptions  Medication Sig Dispense Refill  . abiraterone Acetate (ZYTIGA) 250 MG tablet Take 4 tablets (1,000 mg total) by mouth daily. Take on an empty stomach 1 hour before or 2 hours after a meal 120 tablet 0  . azelastine (ASTELIN) 0.1 % nasal spray Place 1 spray into both nostrils 2 (two) times daily. Use in each nostril as directed    . ELIQUIS 5 MG TABS tablet Take 1 tablet by mouth two  times daily 180 tablet 2  . fluticasone (FLONASE) 50 MCG/ACT nasal spray Place 2 sprays into both nostrils daily.     Marland Kitchen  hydrALAZINE (APRESOLINE) 50 MG tablet Take 50 mg by mouth 2 (two) times daily.    Marland Kitchen latanoprost (XALATAN) 0.005 % ophthalmic solution Place 1 drop into both eyes at bedtime.     Marland Kitchen losartan (COZAAR) 50 MG tablet Take 50 mg by mouth daily.     . meclizine (ANTIVERT) 25 MG tablet Take 25 mg by mouth 3 (three) times daily as needed for dizziness.    . metFORMIN (GLUCOPHAGE) 500 MG tablet Take 500 mg by mouth daily with breakfast.     . metoprolol (LOPRESSOR) 100 MG tablet Take 100 mg by mouth 2 (two) times daily.    . predniSONE (DELTASONE) 5 MG tablet Take 1 tablet (5 mg total) by mouth daily with breakfast. 90 tablet 3  . simvastatin (ZOCOR) 20 MG tablet Take 20 mg by mouth daily at 6 PM.      No current facility-administered medications for this visit.      No Known Allergies  Past Medical History:  Diagnosis Date  . Diabetes mellitus without complication ([place])   . Hypertension   .  Prostate cancer ([place])     No past surgical history on file.  History  Smoking Status  . Never Smoker  Smokeless Tobacco  . Never Used    History  Alcohol Use No    Comment: drinks occasional beer    Family History  Problem Relation Age of Onset  . Colon cancer Sister     Reviw of Systems:  Reviewed in the HPI.  All other systems are negative.  Physical Exam: Blood pressure (!) 162/108, pulse 91, height 5\' 11"  (1.803 m), weight 232 lb 12.8 oz (105.6 kg). Wt Readings from Last 3 Encounters:  03/09/16 232 lb 12.8 oz (105.6 kg)  02/17/16 233 lb 14.4 oz (106.1 kg)  01/14/16 235 lb 4.8 oz (106.7 kg)     General: Well developed, well nourished, in no acute distress.  Head: Normocephalic, atraumatic, sclera non-icteric, mucus membranes are moist,  Neck: Supple. Carotids are 2 + without bruits. No JVD  Lungs: Clear  Heart: irreg. Irreg. Normal [id number], soft systlic murmur Abdomen: Soft, non-tender, non-distended with normal bowel sounds. Msk:  Strength and tone are normal  Extremities: No clubbing or cyanosis. No edema.  Distal pedal pulses are 2+ and equal  Neuro: CN II - XII intact.  Alert and oriented X 3.  Psych:  Normal   ECG: Jan. 24, 2018:   Atrial fib with HR of 91.   ST / T wave abn.    Assessment / Plan:   1. Atrial fibrillation- he remains in atrial fibrillation. He's completely asymptomatic. His left ventricular systolic function is normal. We'll continue with his same medications. At this point I do not think that we need to consider cardioversion because of his lack of symptoms.  I'll see him in 6  months for follow-up visit.  2. Hypertension - his blood pressure is elevated today but has been  well-controlled frequently over the past several months.   He was hospitalized in [place] with dehydration and hypotension.   HCTZ was stopped.  Losartan dose was lowered.   Will have have him monitor his BP regularly .  He is on Prednisone. He may need to add  a low dose of HCTZ while he is on Prednisone.    3. Diabetes mellitus  4. Hyperlipidemia - followed by his general medical doctor.   Mertie Moores, MD  03/09/2016 10:55 AM    [facility]  [place],  [place], [place]  [address] Pager [phone] Phone: [phone]; Fax: [phone]

## 2016-03-09 NOTE — Patient Instructions (Signed)

## 2016-03-10 ENCOUNTER — Other Ambulatory Visit: Payer: Self-pay | Admitting: Cardiovascular Disease

## 2016-03-24 ENCOUNTER — Other Ambulatory Visit: Payer: Self-pay | Admitting: *Deleted

## 2016-03-24 MED ORDER — ABIRATERONE ACETATE 250 MG PO TABS: 1000.0000 mg | ORAL_TABLET | Freq: Every day | ORAL | 0 refills | Status: DC

## 2016-03-29 ENCOUNTER — Other Ambulatory Visit (HOSPITAL_BASED_OUTPATIENT_CLINIC_OR_DEPARTMENT_OTHER): Payer: Medicare Other

## 2016-03-29 ENCOUNTER — Telehealth: Payer: Self-pay | Admitting: Oncology

## 2016-03-29 ENCOUNTER — Ambulatory Visit (HOSPITAL_BASED_OUTPATIENT_CLINIC_OR_DEPARTMENT_OTHER): Payer: Medicare Other | Admitting: Oncology

## 2016-03-29 VITALS — BP 117/108 | HR 85 | Temp 98.6°F | Resp 18 | Ht 71.0 in | Wt 237.0 lb

## 2016-03-29 DIAGNOSIS — E876 Hypokalemia: Secondary | ICD-10-CM

## 2016-03-29 DIAGNOSIS — I4891 Unspecified atrial fibrillation: Secondary | ICD-10-CM

## 2016-03-29 DIAGNOSIS — C7951 Secondary malignant neoplasm of bone: Secondary | ICD-10-CM

## 2016-03-29 DIAGNOSIS — C61 Malignant neoplasm of prostate: Secondary | ICD-10-CM | POA: Diagnosis not present

## 2016-03-29 DIAGNOSIS — E291 Testicular hypofunction: Secondary | ICD-10-CM

## 2016-03-29 DIAGNOSIS — I10 Essential (primary) hypertension: Secondary | ICD-10-CM

## 2016-03-29 LAB — CBC WITH DIFFERENTIAL/PLATELET
BASO%: 0.3 % (ref 0.0–2.0)
Basophils Absolute: 0 10*3/uL (ref 0.0–0.1)
EOS%: 1 % (ref 0.0–7.0)
Eosinophils Absolute: 0.1 10*3/uL (ref 0.0–0.5)
HEMATOCRIT: 36.3 % — AB (ref 38.4–49.9)
HEMOGLOBIN: 12.1 g/dL — AB (ref 13.0–17.1)
LYMPH#: 0.9 10*3/uL (ref 0.9–3.3)
LYMPH%: 10.8 % — ABNORMAL LOW (ref 14.0–49.0)
MCH: 31.7 pg (ref 27.2–33.4)
MCHC: 33.5 g/dL (ref 32.0–36.0)
MCV: 94.6 fL (ref 79.3–98.0)
MONO#: 0.6 10*3/uL (ref 0.1–0.9)
MONO%: 7.7 % (ref 0.0–14.0)
NEUT%: 80.2 % — AB (ref 39.0–75.0)
NEUTROS ABS: 6.4 10*3/uL (ref 1.5–6.5)
PLATELETS: 208 10*3/uL (ref 140–400)
RBC: 3.83 10*6/uL — ABNORMAL LOW (ref 4.20–5.82)
RDW: 13.5 % (ref 11.0–14.6)
WBC: 8 10*3/uL (ref 4.0–10.3)

## 2016-03-29 LAB — COMPREHENSIVE METABOLIC PANEL
ALBUMIN: 3.7 g/dL (ref 3.5–5.0)
ALK PHOS: 89 U/L (ref 40–150)
ALT: 22 U/L (ref 0–55)
ANION GAP: 9 meq/L (ref 3–11)
AST: 20 U/L (ref 5–34)
BILIRUBIN TOTAL: 0.56 mg/dL (ref 0.20–1.20)
BUN: 15.9 mg/dL (ref 7.0–26.0)
CALCIUM: 9.3 mg/dL (ref 8.4–10.4)
CO2: 25 mEq/L (ref 22–29)
CREATININE: 0.9 mg/dL (ref 0.7–1.3)
Chloride: 105 mEq/L (ref 98–109)
EGFR: 85 mL/min/{1.73_m2} — ABNORMAL LOW (ref >90)
Glucose: 116 mg/dl (ref 70–140)
Potassium: 4 mEq/L (ref 3.5–5.1)
Sodium: 139 mEq/L (ref 136–145)
TOTAL PROTEIN: 6.8 g/dL (ref 6.4–8.3)

## 2016-03-29 NOTE — Progress Notes (Signed)
Hematology and Oncology Follow Up Visit  [name] [name] [id number] [birth year] 76 y.o. 03/29/2016 1:11 PM [name] [name], [name], [name], MD   Principle Diagnosis: 76 year old gentleman with prostate cancer diagnosed in 2010. He had a Gleason score of 3+4 = 7 and a PSA of 4.4. His disease was in 2 cores and the biopsy. 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   Prior Therapy: He was treated with external beam radiation completed in October 2010. His PSA remain under reasonable control to about 2014 when his PSA rose to 165 and subsequently was up to 202. He is imaging studies including a CT scan of the abdomen and pelvis which showed 07/15/2013 to have a 3.0 x 2.1 cm retroperitoneal adenopathy. A left retrocrural lymph node was measuring 1.1 cm. A bone scan at that time showed a single focus of abnormal uptake in the left posterior fourth rib. No clear CT correlation which could suggest trauma.He was started on Firmagon by Dr. Jeffie Pollock in May 2015 and his PSA dropped to 18 in October 2015. His PSA in January 2016 was down to 17.48 and in April 2016 up again to 48.52. Testosterone level was at a castrate level of 16. Staging workup continue to show metastatic bony disease without any major visceral involvement. He developed castration resistant disease with a rising PSA up to 48. Xtandi 160 mg daily started in May 2016. Therapy discontinued in November 2017 because of progression of disease.  Current therapy:  Androgen deprivation under the care of Dr. Jeffie Pollock. Zytiga 1000 mg daily with prednisone started in December 2017.   Interim History:  [name] [name] presents today for a follow-up visit with his wife. Since his last visit, he reports no major changes in his health. He continues to take Zytiga without any recent complications. He denied any excessive fatigue, weight loss or edema. His energy is unchanged. He remains active and attends to activities of daily living. He  denied any pathological fractures or bone pain.  His blood pressure have been monitored on an outpatient basis and have been within normal range predominantly. Blood pressure was checked this morning before his left his house and was 128/78.   He does not report any headaches, blurry vision, double vision or alteration of mental status. He does not report any fevers, chills, sweats or weight loss. He does not report any chest pain, palpitation, orthopnea or PND. Does not report any cough or shortness of breath. He does not report any nausea, vomiting, abdominal pain, change in his bowel habits. He does not report any urgency, hesitancy. Does not report any hematuria or dysuria. Rest of his review of systems unremarkable.  Medications: I have reviewed the patient's current medications.  Current Outpatient Prescriptions  Medication Sig Dispense Refill  . abiraterone Acetate (ZYTIGA) 250 MG tablet Take 4 tablets (1,000 mg total) by mouth daily. Take on an empty stomach 1 hour before or 2 hours after a meal 120 tablet 0  . azelastine (ASTELIN) 0.1 % nasal spray Place 1 spray into both nostrils 2 (two) times daily. Use in each nostril as directed    . ELIQUIS 5 MG TABS tablet TAKE 1 TABLET BY MOUTH TWO  TIMES DAILY 180 tablet 3  . fluticasone (FLONASE) 50 MCG/ACT nasal spray Place 2 sprays into both nostrils daily.     . hydrALAZINE (APRESOLINE) 50 MG tablet Take 50 mg by mouth 2 (two) times daily.    Marland Kitchen latanoprost (XALATAN) 0.005 % ophthalmic solution  Place 1 drop into both eyes at bedtime.     Marland Kitchen losartan (COZAAR) 50 MG tablet Take 50 mg by mouth daily.     . meclizine (ANTIVERT) 25 MG tablet Take 25 mg by mouth 3 (three) times daily as needed for dizziness.    . metFORMIN (GLUCOPHAGE) 500 MG tablet Take 500 mg by mouth daily with breakfast.     . metoprolol (LOPRESSOR) 100 MG tablet Take 100 mg by mouth 2 (two) times daily.    . predniSONE (DELTASONE) 5 MG tablet Take 1 tablet (5 mg total) by mouth  daily with breakfast. 90 tablet 3  . simvastatin (ZOCOR) 20 MG tablet Take 20 mg by mouth daily at 6 PM.      No current facility-administered medications for this visit.      Allergies: No Known Allergies  Past Medical History, Surgical history, Social history, and Family History were reviewed and updated.   Physical Exam: Blood pressure (!) 117/108, pulse 85, temperature 98.6 F (37 C), temperature source Oral, resp. rate 18, height 5\' 11"  (1.803 m), weight 237 lb (107.5 kg), SpO2 99 %. ECOG: 1 General appearance: Well-appearing gentleman appeared comfortable. Head: Normocephalic, without obvious abnormality no oral ulcers or lesions. Neck: no adenopathy no thyromegaly. Lymph nodes: Cervical, supraclavicular, and axillary nodes normal. Heart:regular rate and rhythm, S1, S2 normal, no murmur, click, rub or gallop Lung:chest clear, no wheezing, rales, normal symmetric air entry no dullness to percussion. Abdomin: soft, non-tender, without masses or organomegaly no rebound or guarding. EXT:no erythema, induration, or nodules   Lab Results: Lab Results  Component Value Date   WBC 8.0 03/29/2016   HGB 12.1 (L) 03/29/2016   HCT 36.3 (L) 03/29/2016   MCV 94.6 03/29/2016   PLT 208 03/29/2016     Chemistry      Component Value Date/Time   NA 137 02/17/2016 1002   K 3.4 (L) 02/17/2016 1002   CL 101 01/27/2015 1719   CO2 26 02/17/2016 1002   BUN 13.6 02/17/2016 1002   CREATININE 0.9 02/17/2016 1002      Component Value Date/Time   CALCIUM 8.9 02/17/2016 1002   ALKPHOS 74 02/17/2016 1002   AST 17 02/17/2016 1002   ALT 14 02/17/2016 1002   BILITOT 0.58 02/17/2016 1002        Assessment and Plan:   76 year old gentleman with:  1. Prostate cancer diagnosed in 2010. He had a Gleason score of 3+4 = 7 and a PSA of 4.4. His disease was in 2 cores and the biopsy. He received external beam radiation as a definitive modality. He developed recurrent disease with  retroperitoneal adenopathy and a bony metastasis. His PSA was up to 202. He was treated with androgen deprivation and PSA was down to 18. He subsequently developed castration resistant disease with PSA up to 35.   He is status post Xtandi 160 mg daily started in May 2016 with therapy discontinued in December 2017 because progression of disease. PSA at that time was 28.0.  He is currently on Zytiga that was started in December 2017 and continues to tolerated it well. His PSA after 1 month of therapy to decrease slightly.  Risks and benefits of continuing this medication were reviewed and is agreeable to continue at this time.    2. Androgen depravation therapy: I recommended this to continue indefinitely. No issues reported at this time.  3. Bone directed therapy: This will be addressed moving forward given his progression of disease. He will need  to obtain dental clearance before the start of therapy.  4. Atrial fibrillation: He is currently on Eliquis. No bleeding issues noted.  5. Hypertension: His blood pressure continues to be managed by his cardiologist. Although his blood pressure is elevated today, his ambulatory monitoring has been within normal range.  6. Hypokalemia:. His potassium is repeated today and will be replaced as needed.  7. Liver function surveillance: His function test continues to be within normal range.  8. Follow-up: Will be in 4 weeks follow on his progress.  Zola Button, MD 2/13/20181:11 PM

## 2016-03-29 NOTE — Telephone Encounter (Signed)
Appointments scheduled per 2/13 LOS. Patient given AVS report and calendars with future scheduled appointments. °

## 2016-03-30 ENCOUNTER — Telehealth: Payer: Self-pay | Admitting: *Deleted

## 2016-03-30 LAB — PSA: Prostate Specific Ag, Serum: 30.8 ng/mL — ABNORMAL HIGH (ref 0.0–4.0)

## 2016-03-30 NOTE — Telephone Encounter (Signed)
As noted below by Dr. Alen Blew, I informed patient of his PSA level. Per Dr. Alen Blew, continue Zytiga like they discussed in yesterday's appointment. Patient verbalized understanding.

## 2016-03-30 NOTE — Telephone Encounter (Signed)
-----   Message from [name], MD sent at 03/30/2016  8:45 AM EST ----- Please let him know his PSA. He is to stay on Zytiga like we discussed.

## 2016-04-11 ENCOUNTER — Other Ambulatory Visit: Payer: Self-pay | Admitting: Nurse Practitioner

## 2016-04-11 MED ORDER — METOPROLOL TARTRATE 100 MG PO TABS: 100.0000 mg | ORAL_TABLET | Freq: Two times a day (BID) | ORAL | 3 refills | Status: DC

## 2016-04-11 NOTE — Progress Notes (Signed)
Called patient regarding a notification from [facility] that patient has multiple Rx for metoprolol. Patient states his pharmacist at [facility] brought this to his attention this week. He states the duplication came from his hospitalization in [place] several months ago. He states he is taking metoprolol tartrate 100 mg twice daily as advised by Dr. Acie Fredrickson. He asked for refills to be done by Dr. Acie Fredrickson so there is no further confusion. I advised that I will send refills now. He thanked me for the call.

## 2016-04-26 ENCOUNTER — Ambulatory Visit (HOSPITAL_BASED_OUTPATIENT_CLINIC_OR_DEPARTMENT_OTHER): Payer: Medicare Other | Admitting: Oncology

## 2016-04-26 ENCOUNTER — Telehealth: Payer: Self-pay | Admitting: Oncology

## 2016-04-26 ENCOUNTER — Other Ambulatory Visit (HOSPITAL_BASED_OUTPATIENT_CLINIC_OR_DEPARTMENT_OTHER): Payer: Medicare Other

## 2016-04-26 VITALS — BP 147/94 | HR 88 | Temp 98.1°F | Resp 18 | Wt 238.7 lb

## 2016-04-26 DIAGNOSIS — E291 Testicular hypofunction: Secondary | ICD-10-CM | POA: Diagnosis not present

## 2016-04-26 DIAGNOSIS — C61 Malignant neoplasm of prostate: Secondary | ICD-10-CM

## 2016-04-26 DIAGNOSIS — I4891 Unspecified atrial fibrillation: Secondary | ICD-10-CM

## 2016-04-26 DIAGNOSIS — C7951 Secondary malignant neoplasm of bone: Secondary | ICD-10-CM | POA: Diagnosis not present

## 2016-04-26 DIAGNOSIS — I10 Essential (primary) hypertension: Secondary | ICD-10-CM

## 2016-04-26 LAB — COMPREHENSIVE METABOLIC PANEL
ALBUMIN: 3.6 g/dL (ref 3.5–5.0)
ALK PHOS: 94 U/L (ref 40–150)
ALT: 16 U/L (ref 0–55)
AST: 16 U/L (ref 5–34)
Anion Gap: 9 mEq/L (ref 3–11)
BILIRUBIN TOTAL: 0.71 mg/dL (ref 0.20–1.20)
BUN: 17.3 mg/dL (ref 7.0–26.0)
CO2: 27 meq/L (ref 22–29)
CREATININE: 0.9 mg/dL (ref 0.7–1.3)
Calcium: 9.1 mg/dL (ref 8.4–10.4)
Chloride: 105 mEq/L (ref 98–109)
EGFR: 83 mL/min/{1.73_m2} — AB (ref >90)
GLUCOSE: 88 mg/dL (ref 70–140)
Potassium: 4.1 mEq/L (ref 3.5–5.1)
SODIUM: 141 meq/L (ref 136–145)
TOTAL PROTEIN: 6.3 g/dL — AB (ref 6.4–8.3)

## 2016-04-26 LAB — CBC WITH DIFFERENTIAL/PLATELET
BASO%: 0.3 % (ref 0.0–2.0)
Basophils Absolute: 0 10*3/uL (ref 0.0–0.1)
EOS ABS: 0.1 10*3/uL (ref 0.0–0.5)
EOS%: 1.5 % (ref 0.0–7.0)
HCT: 35 % — ABNORMAL LOW (ref 38.4–49.9)
HEMOGLOBIN: 11.6 g/dL — AB (ref 13.0–17.1)
LYMPH%: 14.8 % (ref 14.0–49.0)
MCH: 30.9 pg (ref 27.2–33.4)
MCHC: 33.1 g/dL (ref 32.0–36.0)
MCV: 93.1 fL (ref 79.3–98.0)
MONO#: 0.6 10*3/uL (ref 0.1–0.9)
MONO%: 8.4 % (ref 0.0–14.0)
NEUT%: 75 % (ref 39.0–75.0)
NEUTROS ABS: 5.5 10*3/uL (ref 1.5–6.5)
Platelets: 184 10*3/uL (ref 140–400)
RBC: 3.76 10*6/uL — AB (ref 4.20–5.82)
RDW: 13.1 % (ref 11.0–14.6)
WBC: 7.3 10*3/uL (ref 4.0–10.3)
lymph#: 1.1 10*3/uL (ref 0.9–3.3)

## 2016-04-26 NOTE — Progress Notes (Signed)
Hematology and Oncology Follow Up Visit  [name] [name] [id number] 05-27-1940 76 y.o. 04/26/2016 9:58 AM [name] [name], [name], [name], MD   Principle Diagnosis: 76 year old gentleman with prostate cancer diagnosed in 2010. He had a Gleason score of 3+4 = 7 and a PSA of 4.4. His disease was in 2 cores and the biopsy. 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   Prior Therapy: He was treated with external beam radiation completed in October 2010. His PSA remain under reasonable control to about 2014 when his PSA rose to 165 and subsequently was up to 202. He is imaging studies including a CT scan of the abdomen and pelvis which showed 07/15/2013 to have a 3.0 x 2.1 cm retroperitoneal adenopathy. A left retrocrural lymph node was measuring 1.1 cm. A bone scan at that time showed a single focus of abnormal uptake in the left posterior fourth rib. No clear CT correlation which could suggest trauma.He was started on Firmagon by Dr. Jeffie Pollock in May 2015 and his PSA dropped to 18 in October 2015. His PSA in January 2016 was down to 17.48 and in April 2016 up again to 48.52. Testosterone level was at a castrate level of 16. Staging workup continue to show metastatic bony disease without any major visceral involvement. He developed castration resistant disease with a rising PSA up to 48. Xtandi 160 mg daily started in May 2016. Therapy discontinued in November 2017 because of progression of disease.  Current therapy:  Androgen deprivation under the care of Dr. Jeffie Pollock. Zytiga 1000 mg daily with prednisone started in December 2017.   Interim History:  [name] presents today for a follow-up visit with his wife. Since his last visit, he continues to feel well and remains in excellent shape. He resumed all activities of daily living including exercising multiple days a week by walking 2 miles a day. His blood pressure have been monitored on an outpatient basis and have been within  normal range predominantly.   He continues to take Zytiga without any recent complications. He denied any excessive fatigue, weight loss or edema. He denied any hot flashes or any GI complications. He is taking it regularly as prescribed.   He does not report any headaches, blurry vision, double vision or alteration of mental status. He does not report any fevers, chills, sweats or weight loss. He does not report any chest pain, palpitation, orthopnea or PND. Does not report any cough or shortness of breath. He does not report any nausea, vomiting, abdominal pain, change in his bowel habits. He does not report any urgency, hesitancy. Does not report any hematuria or dysuria. Rest of his review of systems unremarkable.  Medications: I have reviewed the patient's current medications.  Current Outpatient Prescriptions  Medication Sig Dispense Refill  . abiraterone Acetate (ZYTIGA) 250 MG tablet Take 4 tablets (1,000 mg total) by mouth daily. Take on an empty stomach 1 hour before or 2 hours after a meal 120 tablet 0  . azelastine (ASTELIN) 0.1 % nasal spray Place 1 spray into both nostrils 2 (two) times daily. Use in each nostril as directed    . ELIQUIS 5 MG TABS tablet TAKE 1 TABLET BY MOUTH TWO  TIMES DAILY 180 tablet 3  . fluticasone (FLONASE) 50 MCG/ACT nasal spray Place 2 sprays into both nostrils daily.     . hydrALAZINE (APRESOLINE) 50 MG tablet Take 50 mg by mouth 2 (two) times daily.    Marland Kitchen latanoprost (XALATAN) 0.005 %  ophthalmic solution Place 1 drop into both eyes at bedtime.     Marland Kitchen losartan (COZAAR) 50 MG tablet Take 50 mg by mouth daily.     . meclizine (ANTIVERT) 25 MG tablet Take 25 mg by mouth 3 (three) times daily as needed for dizziness.    . metFORMIN (GLUCOPHAGE) 500 MG tablet Take 500 mg by mouth daily with breakfast.     . metoprolol (LOPRESSOR) 100 MG tablet Take 1 tablet (100 mg total) by mouth 2 (two) times daily. 180 tablet 3  . predniSONE (DELTASONE) 5 MG tablet Take 1  tablet (5 mg total) by mouth daily with breakfast. 90 tablet 3  . simvastatin (ZOCOR) 20 MG tablet Take 20 mg by mouth daily at 6 PM.      No current facility-administered medications for this visit.      Allergies: No Known Allergies  Past Medical History, Surgical history, Social history, and Family History were reviewed and updated.   Physical Exam: Blood pressure (!) 147/94, pulse 88, temperature 98.1 F (36.7 C), temperature source Oral, resp. rate 18, weight 238 lb 11.2 oz (108.3 kg), SpO2 99 %. ECOG: 1 General appearance: Alert, awake gentleman without distress. Head: Normocephalic, without obvious abnormality no oral thrush noted. Neck: no adenopathy no thyromegaly. Lymph nodes: Cervical, supraclavicular, and axillary nodes normal. Heart:regular rate and rhythm, S1, S2 normal, no murmur, click, rub or gallop Lung:chest clear, no wheezing, rales, normal symmetric air entry no dullness to percussion. Abdomin: soft, non-tender, without masses or organomegaly no shifting dullness or ascites. EXT:no erythema, induration, or nodules   Lab Results: Lab Results  Component Value Date   WBC 7.3 04/26/2016   HGB 11.6 (L) 04/26/2016   HCT 35.0 (L) 04/26/2016   MCV 93.1 04/26/2016   PLT 184 04/26/2016     Chemistry      Component Value Date/Time   NA 139 03/29/2016 1243   K 4.0 03/29/2016 1243   CL 101 01/27/2015 1719   CO2 25 03/29/2016 1243   BUN 15.9 03/29/2016 1243   CREATININE 0.9 03/29/2016 1243      Component Value Date/Time   CALCIUM 9.3 03/29/2016 1243   ALKPHOS 89 03/29/2016 1243   AST 20 03/29/2016 1243   ALT 22 03/29/2016 1243   BILITOT 0.56 03/29/2016 1243        Assessment and Plan:   76 year old gentleman with:  1. Prostate cancer diagnosed in 2010. He had a Gleason score of 3+4 = 7 and a PSA of 4.4. His disease was in 2 cores and the biopsy. He received external beam radiation as a definitive modality. He developed recurrent disease with  retroperitoneal adenopathy and a bony metastasis. His PSA was up to 202. He was treated with androgen deprivation and PSA was down to 18. He subsequently developed castration resistant disease with PSA up to 58.   He is status post Xtandi 160 mg daily started in May 2016 with therapy discontinued in December 2017 because progression of disease. PSA at that time was 28.0.  He is currently on Zytiga that was started in December 2017 and continues to tolerated it well.  His PSA have responded initially with a minor dropped from 28 to 23. His most recent PSA in February 2018 was 30.8. Risks and benefits of continuing this medication were discussed today and I have recommended continuing it for the time being. He is agreeable to continue and different salvage therapy will be used he develops symptomatic progression. His PSA continues to  be relatively stable, we'll continue on Zytiga.   2. Androgen depravation therapy: I recommended this to continue indefinitely. No issues reported at this time.  3. Bone directed therapy: This will be deferred for the time being.  4. Atrial fibrillation: He is currently on Eliquis. No bleeding issues noted.  5. Hypertension: His blood pressure continues to be managed by his cardiologist. Blood pressure close to normal range with his outpatient check.  6. Hypokalemia:. Potassium has been within normal range  7. Liver function surveillance: His function test continues to be within normal range.  8. Follow-up: Will be in 4 weeks follow on his progress.  [facility], MD 3/13/20189:58 AM

## 2016-04-26 NOTE — Telephone Encounter (Signed)
Patient needs to cancel and reschedule appointment for today

## 2016-04-26 NOTE — Telephone Encounter (Signed)
Gave patient avs report and appointments for April  °

## 2016-04-27 LAB — PSA: Prostate Specific Ag, Serum: 30.8 ng/mL — ABNORMAL HIGH (ref 0.0–4.0)

## 2016-04-27 NOTE — Progress Notes (Signed)
Spoke with patient, gave results of last PSA 

## 2016-04-29 ENCOUNTER — Ambulatory Visit (INDEPENDENT_AMBULATORY_CARE_PROVIDER_SITE_OTHER): Payer: Medicare Other | Admitting: Urology

## 2016-04-29 DIAGNOSIS — N3941 Urge incontinence: Secondary | ICD-10-CM

## 2016-04-29 DIAGNOSIS — N3040 Irradiation cystitis without hematuria: Secondary | ICD-10-CM

## 2016-04-29 DIAGNOSIS — C61 Malignant neoplasm of prostate: Secondary | ICD-10-CM | POA: Diagnosis not present

## 2016-05-02 ENCOUNTER — Encounter: Payer: Self-pay | Admitting: *Deleted

## 2016-05-02 ENCOUNTER — Other Ambulatory Visit: Payer: Self-pay | Admitting: *Deleted

## 2016-05-02 MED ORDER — ABIRATERONE ACETATE 250 MG PO TABS: 1000.0000 mg | ORAL_TABLET | Freq: Every day | ORAL | 0 refills | Status: DC

## 2016-05-25 ENCOUNTER — Other Ambulatory Visit: Payer: Self-pay | Admitting: Cardiovascular Disease

## 2016-05-26 ENCOUNTER — Other Ambulatory Visit (HOSPITAL_BASED_OUTPATIENT_CLINIC_OR_DEPARTMENT_OTHER): Payer: Medicare Other

## 2016-05-26 ENCOUNTER — Ambulatory Visit (HOSPITAL_BASED_OUTPATIENT_CLINIC_OR_DEPARTMENT_OTHER): Payer: Medicare Other | Admitting: Oncology

## 2016-05-26 ENCOUNTER — Telehealth: Payer: Self-pay | Admitting: Oncology

## 2016-05-26 VITALS — BP 159/97 | HR 85 | Temp 98.3°F | Resp 18 | Ht 71.0 in | Wt 242.2 lb

## 2016-05-26 DIAGNOSIS — I4891 Unspecified atrial fibrillation: Secondary | ICD-10-CM | POA: Diagnosis not present

## 2016-05-26 DIAGNOSIS — C7951 Secondary malignant neoplasm of bone: Secondary | ICD-10-CM

## 2016-05-26 DIAGNOSIS — E876 Hypokalemia: Secondary | ICD-10-CM | POA: Diagnosis not present

## 2016-05-26 DIAGNOSIS — C61 Malignant neoplasm of prostate: Secondary | ICD-10-CM | POA: Diagnosis not present

## 2016-05-26 DIAGNOSIS — E291 Testicular hypofunction: Secondary | ICD-10-CM

## 2016-05-26 DIAGNOSIS — I10 Essential (primary) hypertension: Secondary | ICD-10-CM

## 2016-05-26 LAB — COMPREHENSIVE METABOLIC PANEL
ALT: 23 U/L (ref 0–55)
ANION GAP: 8 meq/L (ref 3–11)
AST: 20 U/L (ref 5–34)
Albumin: 3.7 g/dL (ref 3.5–5.0)
Alkaline Phosphatase: 104 U/L (ref 40–150)
BILIRUBIN TOTAL: 0.84 mg/dL (ref 0.20–1.20)
BUN: 15.5 mg/dL (ref 7.0–26.0)
CHLORIDE: 103 meq/L (ref 98–109)
CO2: 29 meq/L (ref 22–29)
CREATININE: 0.9 mg/dL (ref 0.7–1.3)
Calcium: 9.1 mg/dL (ref 8.4–10.4)
EGFR: 85 mL/min/{1.73_m2} — AB (ref >90)
GLUCOSE: 109 mg/dL (ref 70–140)
Potassium: 3.8 mEq/L (ref 3.5–5.1)
SODIUM: 141 meq/L (ref 136–145)
TOTAL PROTEIN: 6.8 g/dL (ref 6.4–8.3)

## 2016-05-26 LAB — CBC WITH DIFFERENTIAL/PLATELET
BASO%: 0.3 % (ref 0.0–2.0)
Basophils Absolute: 0 10*3/uL (ref 0.0–0.1)
EOS%: 1.3 % (ref 0.0–7.0)
Eosinophils Absolute: 0.1 10*3/uL (ref 0.0–0.5)
HCT: 35.9 % — ABNORMAL LOW (ref 38.4–49.9)
HGB: 12.2 g/dL — ABNORMAL LOW (ref 13.0–17.1)
LYMPH%: 15.7 % (ref 14.0–49.0)
MCH: 30.9 pg (ref 27.2–33.4)
MCHC: 33.8 g/dL (ref 32.0–36.0)
MCV: 91.3 fL (ref 79.3–98.0)
MONO#: 0.6 10*3/uL (ref 0.1–0.9)
MONO%: 8.5 % (ref 0.0–14.0)
NEUT%: 74.2 % (ref 39.0–75.0)
NEUTROS ABS: 5.4 10*3/uL (ref 1.5–6.5)
Platelets: 186 10*3/uL (ref 140–400)
RBC: 3.93 10*6/uL — AB (ref 4.20–5.82)
RDW: 13.6 % (ref 11.0–14.6)
WBC: 7.3 10*3/uL (ref 4.0–10.3)
lymph#: 1.2 10*3/uL (ref 0.9–3.3)

## 2016-05-26 NOTE — Progress Notes (Signed)
Hematology and Oncology Follow Up Visit  [name] [name] [id number] 15-Jan-1941 76 y.o. 05/26/2016 10:01 AM [name] [name], [name], [name], MD   Principle Diagnosis: 76 year old [name] [name] with prostate cancer diagnosed in 2010. [name] [name] had a Gleason score of 3+4 = 7 and a PSA of 4.4. His disease was in 2 cores and the biopsy. 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   Prior Therapy: [name] [name] was treated with external beam radiation completed in October 2010. His PSA remain under reasonable control to about 2014 when his PSA rose to 165 and subsequently was up to 202. [name] [name] is imaging studies including a CT scan of the abdomen and pelvis which showed 07/15/2013 to have a 3.0 x 2.1 cm retroperitoneal adenopathy. A left retrocrural lymph node was measuring 1.1 cm. A bone scan at that time showed a single focus of abnormal uptake in the left posterior fourth rib. No clear CT correlation which could suggest trauma.[name] [name] was started on Firmagon by Dr. Jeffie [name] in May 2015 and his PSA dropped to 18 in October 2015. His PSA in January 2016 was down to 17.48 and in April 2016 up again to 48.52. Testosterone level was at a castrate level of 16. Staging workup continue to show metastatic bony disease without any major visceral involvement. [name] [name] developed castration resistant disease with a rising PSA up to 48. Xtandi 160 mg daily started in May 2016. Therapy discontinued in November 2017 because of progression of disease.  Current therapy:  Androgen deprivation under the care of Dr. Jeffie [name]. Zytiga 1000 mg daily with prednisone started in December 2017.   Interim History:  [name] [name] presents today for a follow-up visit with his wife. Since his last visit, [name] [name] reports no major changes in his health. [name] [name] continues to take Zytiga without any recent complications. [name] [name] denied any excessive fatigue, weight loss but does report mild edema. [name] [name] denied any hot flashes or any GI complications. [name] [name] remains reasonably  active and have upped his exercise now close to walking 2 miles a day for 5 days a week. His appetite remains excellent which contributed to his 3 pound weight gain since last visit. [name] [name] denied any bone pain or pathological fractures.   [name] [name] does not report any headaches, blurry vision, double vision or alteration of mental status. [name] [name] does not report any fevers, chills, sweats or weight loss. [name] [name] does not report any chest pain, palpitation, orthopnea or PND. Does not report any cough or shortness of breath. [name] [name] does not report any nausea, vomiting, abdominal pain, change in his bowel habits. [name] [name] does not report any urgency, hesitancy. Does not report any hematuria or dysuria. Rest of his review of systems unremarkable.  Medications: I have reviewed the patient's current medications.  Current Outpatient Prescriptions  Medication Sig Dispense Refill  . abiraterone Acetate (ZYTIGA) 250 MG tablet Take 4 tablets (1,000 mg total) by mouth daily. Take on an empty stomach 1 hour before or 2 hours after a meal 120 tablet 0  . azelastine (ASTELIN) 0.1 % nasal spray Place 1 spray into both nostrils 2 (two) times daily. Use in each nostril as directed    . ELIQUIS 5 MG TABS tablet TAKE 1 TABLET BY MOUTH TWO  TIMES DAILY 180 tablet 3  . fluticasone (FLONASE) 50 MCG/ACT nasal spray Place 2 sprays into both nostrils daily.     . hydrALAZINE (APRESOLINE) 50 MG tablet Take 50 mg by mouth 2 (two) times daily.    Marland Kitchen latanoprost (XALATAN) 0.005 %  ophthalmic solution Place 1 drop into both eyes at bedtime.     Marland Kitchen losartan (COZAAR) 50 MG tablet Take 50 mg by mouth daily.     . meclizine (ANTIVERT) 25 MG tablet Take 25 mg by mouth 3 (three) times daily as needed for dizziness.    . metFORMIN (GLUCOPHAGE) 500 MG tablet Take 500 mg by mouth daily with breakfast.     . metoprolol (LOPRESSOR) 100 MG tablet Take 1 tablet (100 mg total) by mouth 2 (two) times daily. 180 tablet 3  . predniSONE (DELTASONE) 5 MG tablet Take 1 tablet (5  mg total) by mouth daily with breakfast. 90 tablet 3  . simvastatin (ZOCOR) 20 MG tablet Take 20 mg by mouth daily at 6 PM.      No current facility-administered medications for this visit.      Allergies: No Known Allergies  Past Medical History, Surgical history, Social history, and Family History were reviewed and updated.   Physical Exam: Blood pressure (!) 159/97, pulse 85, temperature 98.3 F (36.8 C), temperature source Oral, resp. rate 18, height 5\' 11"  (1.803 m), weight 242 lb 3.2 oz (109.9 kg), SpO2 100 %. ECOG: 1 General appearance: Alert, awake [name] [name] without distress. Head: Normocephalic, without obvious abnormality no oral ulcers or thrush. Neck: no adenopathy no thyromegaly. Lymph nodes: Cervical, supraclavicular, and axillary nodes normal. Heart:regular rate and rhythm, S1, S2 normal, no murmur, click, rub or gallop Lung:chest clear, no wheezing, rales, normal symmetric air entry no dullness to percussion. Abdomin: soft, non-tender, without masses or organomegaly no rebound or guarding. EXT:no erythema, induration, or nodules   Lab Results: Lab Results  Component Value Date   WBC 7.3 05/26/2016   HGB 12.2 (L) 05/26/2016   HCT 35.9 (L) 05/26/2016   MCV 91.3 05/26/2016   PLT 186 05/26/2016     Chemistry      Component Value Date/Time   NA 141 04/26/2016 0931   K 4.1 04/26/2016 0931   CL 101 01/27/2015 1719   CO2 27 04/26/2016 0931   BUN 17.3 04/26/2016 0931   CREATININE 0.9 04/26/2016 0931      Component Value Date/Time   CALCIUM 9.1 04/26/2016 0931   ALKPHOS 94 04/26/2016 0931   AST 16 04/26/2016 0931   ALT 16 04/26/2016 0931   BILITOT 0.71 04/26/2016 0931      Results for [name], [name] (MRN [id number]) as of 05/26/2016 09:45  Ref. Range 02/17/2016 10:02 03/29/2016 12:43 04/26/2016 09:31  PSA Latest Ref Range: 0.0 - 4.0 ng/mL 23.9 (H) 30.8 (H) 30.8 (H)    Assessment and Plan:   76 year old [name] [name] with:  1. Prostate cancer diagnosed in  2010. [name] [name] had a Gleason score of 3+4 = 7 and a PSA of 4.4. His disease was in 2 cores and the biopsy. [name] [name] received external beam radiation as a definitive modality. [name] [name] developed recurrent disease with retroperitoneal adenopathy and a bony metastasis. His PSA was up to 202. [name] [name] was treated with androgen deprivation and PSA was down to 18. [name] [name] subsequently developed castration resistant disease with PSA up to 57.   [name] [name] is status post Xtandi 160 mg daily started in May 2016 with therapy discontinued in December 2017 because progression of disease. PSA at that time was 28.0.  [name] [name] is currently on Zytiga that was started in December 2017 and continues to tolerated it well.  His PSA continues to be relatively stable in the last 3 months. Risks and benefits of continuing Zytiga was reviewed today  and is agreeable to continue. Different salvage therapy will be used if [name] [name] develops symptomatic progression. I will be in the form of Xofigo or systemic chemotherapy.   2. Androgen depravation therapy: I recommended this to continue indefinitely.   3. Bone directed therapy: This will be deferred for the time being. We will consider this option if [name] [name] develops worsening bony disease.  4. Atrial fibrillation: [name] [name] is currently on Eliquis. No bleeding issues noted.  5. Hypertension: His blood pressure continues to be managed by his cardiologist. Blood pressure close to normal range with his outpatient check.  6. Hypokalemia:. Potassium has been within normal range based on readings in March 2018 no be repeated today.  7. Liver function surveillance: His function test continues to be within normal range.  8. Follow-up: Will be in 4 weeks follow on his progress.  [facility], MD 4/12/201810:01 AM

## 2016-05-26 NOTE — Telephone Encounter (Signed)
Appointments scheduled per 4.12.18 LOS. Patient given AVS report and calendars with future scheduled appointments. °

## 2016-05-27 ENCOUNTER — Telehealth: Payer: Self-pay | Admitting: *Deleted

## 2016-05-27 LAB — PSA: Prostate Specific Ag, Serum: 40.7 ng/mL — ABNORMAL HIGH (ref 0.0–4.0)

## 2016-05-27 NOTE — Telephone Encounter (Signed)
Spoke with patient, gave results of last PSA 

## 2016-05-27 NOTE — Telephone Encounter (Signed)
-----   Message from [name], MD sent at 05/27/2016 10:47 AM EDT ----- Please let him know his PSA is up. We plan not to make changes for now. We will give it another month.

## 2016-05-30 ENCOUNTER — Other Ambulatory Visit: Payer: Self-pay | Admitting: *Deleted

## 2016-05-30 MED ORDER — ABIRATERONE ACETATE 250 MG PO TABS: 1000.0000 mg | ORAL_TABLET | Freq: Every day | ORAL | 0 refills | Status: AC

## 2016-06-29 ENCOUNTER — Telehealth: Payer: Self-pay | Admitting: Oncology

## 2016-06-29 ENCOUNTER — Ambulatory Visit (HOSPITAL_BASED_OUTPATIENT_CLINIC_OR_DEPARTMENT_OTHER): Payer: Medicare Other | Admitting: Oncology

## 2016-06-29 ENCOUNTER — Other Ambulatory Visit (HOSPITAL_BASED_OUTPATIENT_CLINIC_OR_DEPARTMENT_OTHER): Payer: Medicare Other

## 2016-06-29 VITALS — BP 168/97 | HR 61 | Temp 98.1°F | Resp 18 | Ht 71.0 in | Wt 231.7 lb

## 2016-06-29 DIAGNOSIS — C7951 Secondary malignant neoplasm of bone: Secondary | ICD-10-CM

## 2016-06-29 DIAGNOSIS — C61 Malignant neoplasm of prostate: Secondary | ICD-10-CM

## 2016-06-29 DIAGNOSIS — I4891 Unspecified atrial fibrillation: Secondary | ICD-10-CM

## 2016-06-29 DIAGNOSIS — I10 Essential (primary) hypertension: Secondary | ICD-10-CM

## 2016-06-29 DIAGNOSIS — E291 Testicular hypofunction: Secondary | ICD-10-CM | POA: Diagnosis not present

## 2016-06-29 DIAGNOSIS — R609 Edema, unspecified: Secondary | ICD-10-CM | POA: Diagnosis not present

## 2016-06-29 LAB — CBC WITH DIFFERENTIAL/PLATELET
BASO%: 0.3 % (ref 0.0–2.0)
BASOS ABS: 0 10*3/uL (ref 0.0–0.1)
EOS%: 1 % (ref 0.0–7.0)
Eosinophils Absolute: 0.1 10*3/uL (ref 0.0–0.5)
HEMATOCRIT: 38.4 % (ref 38.4–49.9)
HEMOGLOBIN: 12.9 g/dL — AB (ref 13.0–17.1)
LYMPH#: 1.3 10*3/uL (ref 0.9–3.3)
LYMPH%: 18.5 % (ref 14.0–49.0)
MCH: 30.4 pg (ref 27.2–33.4)
MCHC: 33.6 g/dL (ref 32.0–36.0)
MCV: 90.6 fL (ref 79.3–98.0)
MONO#: 0.6 10*3/uL (ref 0.1–0.9)
MONO%: 7.7 % (ref 0.0–14.0)
NEUT%: 72.5 % (ref 39.0–75.0)
NEUTROS ABS: 5.2 10*3/uL (ref 1.5–6.5)
Platelets: 196 10*3/uL (ref 140–400)
RBC: 4.24 10*6/uL (ref 4.20–5.82)
RDW: 13.9 % (ref 11.0–14.6)
WBC: 7.2 10*3/uL (ref 4.0–10.3)

## 2016-06-29 LAB — COMPREHENSIVE METABOLIC PANEL
ALBUMIN: 3.8 g/dL (ref 3.5–5.0)
ALK PHOS: 94 U/L (ref 40–150)
ALT: 19 U/L (ref 0–55)
AST: 21 U/L (ref 5–34)
Anion Gap: 9 mEq/L (ref 3–11)
BUN: 17.5 mg/dL (ref 7.0–26.0)
CALCIUM: 9.3 mg/dL (ref 8.4–10.4)
CO2: 28 mEq/L (ref 22–29)
CREATININE: 1.1 mg/dL (ref 0.7–1.3)
Chloride: 104 mEq/L (ref 98–109)
EGFR: 67 mL/min/{1.73_m2} — ABNORMAL LOW (ref >90)
GLUCOSE: 109 mg/dL (ref 70–140)
Potassium: 3.5 mEq/L (ref 3.5–5.1)
Sodium: 141 mEq/L (ref 136–145)
TOTAL PROTEIN: 7 g/dL (ref 6.4–8.3)
Total Bilirubin: 0.9 mg/dL (ref 0.20–1.20)

## 2016-06-29 NOTE — Telephone Encounter (Signed)
Appointments scheduled per 06/29/16 los. Patient was given a copy of the AVS report and appointment schedule per 06/29/16 los. °

## 2016-06-29 NOTE — Progress Notes (Signed)
Hematology and Oncology Follow Up Visit  [name] [name] [id number] 06-12-1940 76 y.o. 06/29/2016 10:06 AM [name] [name], [name], [name], MD   Principle Diagnosis: 76 year old gentleman with prostate cancer diagnosed in 2010. He had a Gleason score of 3+4 = 7 and a PSA of 4.4. His disease was in 2 cores and the biopsy. 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   Prior Therapy: He was treated with external beam radiation completed in October 2010. His PSA remain under reasonable control to about 2014 when his PSA rose to 165 and subsequently was up to 202. He is imaging studies including a CT scan of the abdomen and pelvis which showed 07/15/2013 to have a 3.0 x 2.1 cm retroperitoneal adenopathy. A left retrocrural lymph node was measuring 1.1 cm. A bone scan at that time showed a single focus of abnormal uptake in the left posterior fourth rib. No clear CT correlation which could suggest trauma.He was started on Firmagon by Dr. Jeffie Pollock in May 2015 and his PSA dropped to 18 in October 2015. His PSA in January 2016 was down to 17.48 and in April 2016 up again to 48.52. Testosterone level was at a castrate level of 16. Staging workup continue to show metastatic bony disease without any major visceral involvement. He developed castration resistant disease with a rising PSA up to 48. Xtandi 160 mg daily started in May 2016. Therapy discontinued in November 2017 because of progression of disease.  Current therapy:  Androgen deprivation under the care of Dr. Jeffie Pollock. Zytiga 1000 mg daily with prednisone started in December 2017.   Interim History:  [name] [name] presents today for a follow-up visit with his wife. Since his last visit, he continues to show excellent health without any complaints. He continues to exercise regularly and have lost 10 pounds intentionally. He was able to play golf for the first time in a long time.  He continues to take Zytiga without any recent  complications. He denied any excessive fatigue, weight loss but does report mild edema. He denied any hot flashes or any GI complications. He denied any bone pain or pathological fractures. He denied any constitutional symptoms or hospitalizations.   He does not report any headaches, blurry vision, double vision or alteration of mental status. He does not report any fevers, chills, sweats or weight loss. He does not report any chest pain, palpitation, orthopnea or PND. Does not report any cough or shortness of breath. He does not report any nausea, vomiting, abdominal pain, change in his bowel habits. He does not report any urgency, hesitancy. Does not report any hematuria or dysuria. Rest of his review of systems unremarkable.  Medications: I have reviewed the patient's current medications.  Current Outpatient Prescriptions  Medication Sig Dispense Refill  . abiraterone Acetate (ZYTIGA) 250 MG tablet Take 4 tablets (1,000 mg total) by mouth daily. Take on an empty stomach 1 hour before or 2 hours after a meal 120 tablet 0  . azelastine (ASTELIN) 0.1 % nasal spray Place 1 spray into both nostrils 2 (two) times daily. Use in each nostril as directed    . ELIQUIS 5 MG TABS tablet TAKE 1 TABLET BY MOUTH TWO  TIMES DAILY 180 tablet 3  . fluticasone (FLONASE) 50 MCG/ACT nasal spray Place 2 sprays into both nostrils daily.     . hydrALAZINE (APRESOLINE) 50 MG tablet Take 50 mg by mouth 2 (two) times daily.    Marland Kitchen latanoprost (XALATAN) 0.005 % ophthalmic  solution Place 1 drop into both eyes at bedtime.     Marland Kitchen losartan (COZAAR) 50 MG tablet Take 50 mg by mouth daily.     . meclizine (ANTIVERT) 25 MG tablet Take 25 mg by mouth 3 (three) times daily as needed for dizziness.    . metFORMIN (GLUCOPHAGE) 500 MG tablet Take 500 mg by mouth daily with breakfast.     . metoprolol (LOPRESSOR) 100 MG tablet Take 1 tablet (100 mg total) by mouth 2 (two) times daily. 180 tablet 3  . predniSONE (DELTASONE) 5 MG tablet  Take 1 tablet (5 mg total) by mouth daily with breakfast. 90 tablet 3  . simvastatin (ZOCOR) 20 MG tablet Take 20 mg by mouth daily at 6 PM.      No current facility-administered medications for this visit.      Allergies: No Known Allergies  Past Medical History, Surgical history, Social history, and Family History were reviewed and updated.   Physical Exam: Blood pressure (!) 168/97, pulse 61, temperature 98.1 F (36.7 C), temperature source Oral, resp. rate 18, height 5\' 11"  (1.803 m), weight 231 lb 11.2 oz (105.1 kg), SpO2 99 %. ECOG: 1 General appearance: Pleasant-appearing gentleman without distress. Head: Normocephalic, without obvious abnormality no oral thrush or ulcers. Neck: no adenopathy no thyromegaly. Lymph nodes: Cervical, supraclavicular, and axillary nodes normal. Heart:regular rate and rhythm, S1, S2 normal, no murmur, click, rub or gallop Lung:chest clear, no wheezing, rales, normal symmetric air entry no dullness to percussion. Abdomin: soft, non-tender, without masses or organomegaly no shifting dullness or ascites. EXT:no erythema, induration, or nodules   Lab Results: Lab Results  Component Value Date   WBC 7.2 06/29/2016   HGB 12.9 (L) 06/29/2016   HCT 38.4 06/29/2016   MCV 90.6 06/29/2016   PLT 196 06/29/2016     Chemistry      Component Value Date/Time   NA 141 05/26/2016 0936   K 3.8 05/26/2016 0936   CL 101 01/27/2015 1719   CO2 29 05/26/2016 0936   BUN 15.5 05/26/2016 0936   CREATININE 0.9 05/26/2016 0936      Component Value Date/Time   CALCIUM 9.1 05/26/2016 0936   ALKPHOS 104 05/26/2016 0936   AST 20 05/26/2016 0936   ALT 23 05/26/2016 0936   BILITOT 0.84 05/26/2016 0936      Results for [name] [name], [name] [name] (MRN [id number]) as of 06/29/2016 09:52  Ref. Range 04/26/2016 09:31 05/26/2016 09:36  PSA Latest Ref Range: 0.0 - 4.0 ng/mL 30.8 (H) 40.7 (H)     Assessment and Plan:   76 year old gentleman with:  1. Prostate cancer  diagnosed in 2010. He had a Gleason score of 3+4 = 7 and a PSA of 4.4. His disease was in 2 cores and the biopsy. He received external beam radiation as a definitive modality. He developed recurrent disease with retroperitoneal adenopathy and a bony metastasis. His PSA was up to 202. He was treated with androgen deprivation and PSA was down to 18. He subsequently developed castration resistant disease with PSA up to 66.   He is status post Xtandi 160 mg daily started in May 2016 with therapy discontinued in December 2017 because progression of disease. PSA at that time was 28.0.  He is currently on Zytiga that was started in December 2017 and continues to tolerated it well.  His PSA did rise in April 2018 to 40.7. Given his excellent health and shape at this time we have discussed continuing Zytiga for the time  being and restage him in July 2018. He has progression of disease at that time, different salvage therapy will be considered.   2. Androgen depravation therapy: I recommended this to continue indefinitely.   3. Bone directed therapy: This will be deferred for the time being. We will consider this option if he develops worsening bony disease.  4. Atrial fibrillation: He is currently on Eliquis without bleeding complications.  5. Hypertension: His blood pressure continues to be managed by his cardiologist. Blood pressure is within normal range with his primary care physician evaluation.  6. Hypokalemia:. Potassium has been within normal range based on readings in April 2018.  7. Liver function surveillance: His function test continues to be within normal range.  8. Follow-up: Will be in 4 weeks follow on his progress.  [facility], MD 5/16/201810:06 AM

## 2016-06-30 ENCOUNTER — Telehealth: Payer: Self-pay | Admitting: *Deleted

## 2016-06-30 LAB — PSA: Prostate Specific Ag, Serum: 60.3 ng/mL — ABNORMAL HIGH (ref 0.0–4.0)

## 2016-06-30 NOTE — Telephone Encounter (Signed)
-----   Message from [name], MD sent at 06/30/2016  9:45 AM EDT ----- Please let him know his PSA is up again. Will stay with Zytiga for now.

## 2016-06-30 NOTE — Telephone Encounter (Signed)
As noted below by Dr. Alen Blew, I informed patient of his PSA level. Continue Zytiga for now. Patient verbalized understanding.

## 2016-07-01 ENCOUNTER — Other Ambulatory Visit: Payer: Self-pay | Admitting: Oncology

## 2016-07-29 ENCOUNTER — Other Ambulatory Visit: Payer: Self-pay | Admitting: Oncology

## 2016-08-02 ENCOUNTER — Ambulatory Visit (HOSPITAL_BASED_OUTPATIENT_CLINIC_OR_DEPARTMENT_OTHER): Payer: Medicare Other | Admitting: Oncology

## 2016-08-02 ENCOUNTER — Other Ambulatory Visit (HOSPITAL_BASED_OUTPATIENT_CLINIC_OR_DEPARTMENT_OTHER): Payer: Medicare Other

## 2016-08-02 ENCOUNTER — Telehealth: Payer: Self-pay | Admitting: Oncology

## 2016-08-02 VITALS — BP 168/104 | HR 88 | Temp 98.2°F | Resp 19 | Ht 71.0 in | Wt 233.9 lb

## 2016-08-02 DIAGNOSIS — C7951 Secondary malignant neoplasm of bone: Secondary | ICD-10-CM

## 2016-08-02 DIAGNOSIS — I4891 Unspecified atrial fibrillation: Secondary | ICD-10-CM | POA: Diagnosis not present

## 2016-08-02 DIAGNOSIS — E291 Testicular hypofunction: Secondary | ICD-10-CM | POA: Diagnosis not present

## 2016-08-02 DIAGNOSIS — C61 Malignant neoplasm of prostate: Secondary | ICD-10-CM

## 2016-08-02 DIAGNOSIS — I10 Essential (primary) hypertension: Secondary | ICD-10-CM | POA: Diagnosis not present

## 2016-08-02 DIAGNOSIS — E876 Hypokalemia: Secondary | ICD-10-CM

## 2016-08-02 LAB — COMPREHENSIVE METABOLIC PANEL
ALT: 16 U/L (ref 0–55)
AST: 19 U/L (ref 5–34)
Albumin: 3.5 g/dL (ref 3.5–5.0)
Alkaline Phosphatase: 101 U/L (ref 40–150)
Anion Gap: 6 mEq/L (ref 3–11)
BUN: 16 mg/dL (ref 7.0–26.0)
CALCIUM: 9.3 mg/dL (ref 8.4–10.4)
CHLORIDE: 105 meq/L (ref 98–109)
CO2: 26 meq/L (ref 22–29)
CREATININE: 0.9 mg/dL (ref 0.7–1.3)
EGFR: 84 mL/min/{1.73_m2} — ABNORMAL LOW (ref >90)
GLUCOSE: 114 mg/dL (ref 70–140)
POTASSIUM: 3.4 meq/L — AB (ref 3.5–5.1)
SODIUM: 137 meq/L (ref 136–145)
Total Bilirubin: 0.88 mg/dL (ref 0.20–1.20)
Total Protein: 6.7 g/dL (ref 6.4–8.3)

## 2016-08-02 LAB — CBC WITH DIFFERENTIAL/PLATELET
BASO%: 0.4 % (ref 0.0–2.0)
BASOS ABS: 0 10*3/uL (ref 0.0–0.1)
EOS%: 1.5 % (ref 0.0–7.0)
Eosinophils Absolute: 0.1 10*3/uL (ref 0.0–0.5)
HCT: 36.3 % — ABNORMAL LOW (ref 38.4–49.9)
HGB: 12.2 g/dL — ABNORMAL LOW (ref 13.0–17.1)
LYMPH#: 1.1 10*3/uL (ref 0.9–3.3)
LYMPH%: 15.4 % (ref 14.0–49.0)
MCH: 30.6 pg (ref 27.2–33.4)
MCHC: 33.6 g/dL (ref 32.0–36.0)
MCV: 91 fL (ref 79.3–98.0)
MONO#: 0.6 10*3/uL (ref 0.1–0.9)
MONO%: 7.6 % (ref 0.0–14.0)
NEUT#: 5.6 10*3/uL (ref 1.5–6.5)
NEUT%: 75.1 % — AB (ref 39.0–75.0)
Platelets: 188 10*3/uL (ref 140–400)
RBC: 3.99 10*6/uL — AB (ref 4.20–5.82)
RDW: 14.7 % — ABNORMAL HIGH (ref 11.0–14.6)
WBC: 7.4 10*3/uL (ref 4.0–10.3)

## 2016-08-02 NOTE — Telephone Encounter (Signed)
Scheduled appt per 6/19 los. Gave patient AVS and calender per LOS. Lab/CT/ bone scan to be done at [facility]. [facility] to contact patient with Ct schedule.

## 2016-08-02 NOTE — Progress Notes (Signed)
Hematology and Oncology Follow Up Visit  [name] [name] [id number] 09-25-1940 76 y.o. 08/02/2016 10:12 AM [name], [name], [name], [name], MD   Principle Diagnosis: 76 year old gentleman with prostate cancer diagnosed in 2010. He had a Gleason score of 3+4 = 7 and a PSA of 4.4. His disease was in 2 cores and the biopsy. 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   Prior Therapy: He was treated with external beam radiation completed in October 2010. His PSA remain under reasonable control to about 2014 when his PSA rose to 165 and subsequently was up to 202. He is imaging studies including a CT scan of the abdomen and pelvis which showed 07/15/2013 to have a 3.0 x 2.1 cm retroperitoneal adenopathy. A left retrocrural lymph node was measuring 1.1 cm. A bone scan at that time showed a single focus of abnormal uptake in the left posterior fourth rib. No clear CT correlation which could suggest trauma.He was started on Firmagon by Dr. Jeffie Pollock in May 2015 and his PSA dropped to 18 in October 2015. His PSA in January 2016 was down to 17.48 and in April 2016 up again to 48.52. Testosterone level was at a castrate level of 16. Staging workup continue to show metastatic bony disease without any major visceral involvement. He developed castration resistant disease with a rising PSA up to 48. Xtandi 160 mg daily started in May 2016. Therapy discontinued in November 2017 because of progression of disease.  Current therapy:  Androgen deprivation under the care of Dr. Jeffie Pollock. Zytiga 1000 mg daily with prednisone started in December 2017.   Interim History:  [name] presents today for a follow-up visit with his wife. Since his last visit, he continues to do well without any recent complaints. He continues to take Zytiga without any recent complications. He denied any excessive fatigue, weight loss but does report mild edema. He denied any hot flashes or any GI complications. He  denied any bone pain or pathological fractures.   He continues to exercise regularly and enjoys excellent performance status and quality of life. He denied any excessive fatigue, pathological fractures or bone pain he does not report any abdominal distention or early satiety. He denied any recent illnesses or hospitalizations.    He does not report any headaches, blurry vision, double vision or alteration of mental status. He does not report any fevers, chills, sweats or weight loss. He does not report any chest pain, palpitation, orthopnea or PND. Does not report any cough or shortness of breath. He does not report any nausea, vomiting, abdominal pain, change in his bowel habits. He does not report any urgency, hesitancy. Does not report any hematuria or dysuria. Rest of his review of systems unremarkable.  Medications: I have reviewed the patient's current medications.  Current Outpatient Prescriptions  Medication Sig Dispense Refill  . azelastine (ASTELIN) 0.1 % nasal spray Place 1 spray into both nostrils 2 (two) times daily. Use in each nostril as directed    . ELIQUIS 5 MG TABS tablet TAKE 1 TABLET BY MOUTH TWO  TIMES DAILY 180 tablet 3  . fluticasone (FLONASE) 50 MCG/ACT nasal spray Place 2 sprays into both nostrils daily.     . hydrALAZINE (APRESOLINE) 50 MG tablet Take 50 mg by mouth 2 (two) times daily.    Marland Kitchen latanoprost (XALATAN) 0.005 % ophthalmic solution Place 1 drop into both eyes at bedtime.     Marland Kitchen losartan (COZAAR) 50 MG tablet Take 50 mg by  mouth daily.     . meclizine (ANTIVERT) 25 MG tablet Take 25 mg by mouth 3 (three) times daily as needed for dizziness.    . metFORMIN (GLUCOPHAGE) 500 MG tablet Take 500 mg by mouth daily with breakfast.     . metoprolol (LOPRESSOR) 100 MG tablet Take 1 tablet (100 mg total) by mouth 2 (two) times daily. 180 tablet 3  . predniSONE (DELTASONE) 5 MG tablet Take 1 tablet (5 mg total) by mouth daily with breakfast. 90 tablet 3  . simvastatin  (ZOCOR) 20 MG tablet Take 20 mg by mouth daily at 6 PM.     . ZYTIGA 250 MG tablet TAKE 4 TABLETS (1,000MG ) BY MOUTH ONCE DAILY ON AN EMPTY STOMACH 1 HOUR BEFORE OR 2 HOURS AFTER A MEAL 120 tablet 0   No current facility-administered medications for this visit.      Allergies: No Known Allergies  Past Medical History, Surgical history, Social history, and Family History were reviewed and updated.   Physical Exam: Blood pressure (!) 168/104, pulse 88, temperature 98.2 F (36.8 C), temperature source Oral, resp. rate 19, height 5\' 11"  (1.803 m), weight 233 lb 14.4 oz (106.1 kg), SpO2 97 %. ECOG: 1 General appearance: Alert, awake gentleman without distress. Head: Normocephalic, without obvious abnormality no oral ulcers or thrush. Neck: no adenopathy no thyromegaly. Lymph nodes: Cervical, supraclavicular, and axillary nodes normal. Heart:regular rate and rhythm, S1, S2 normal, no murmur, click, rub or gallop Lung:chest clear, no wheezing, rales, normal symmetric air entry no dullness to percussion. Abdomin: soft, non-tender, without masses or organomegaly no rebound or guarding. EXT:no erythema, induration, or nodules   Lab Results: Lab Results  Component Value Date   WBC 7.4 08/02/2016   HGB 12.2 (L) 08/02/2016   HCT 36.3 (L) 08/02/2016   MCV 91.0 08/02/2016   PLT 188 08/02/2016     Chemistry      Component Value Date/Time   NA 137 08/02/2016 0931   K 3.4 (L) 08/02/2016 0931   CL 101 01/27/2015 1719   CO2 26 08/02/2016 0931   BUN 16.0 08/02/2016 0931   CREATININE 0.9 08/02/2016 0931      Component Value Date/Time   CALCIUM 9.3 08/02/2016 0931   ALKPHOS 101 08/02/2016 0931   AST 19 08/02/2016 0931   ALT 16 08/02/2016 0931   BILITOT 0.88 08/02/2016 0931      Results for SAADIQ, POCHE (MRN [id number]) as of 08/02/2016 09:33  Ref. Range 05/26/2016 09:36 06/29/2016 09:37  PSA Latest Ref Range: 0.0 - 4.0 ng/mL 40.7 (H) 60.3 (H)      Assessment and Plan:    76 year old gentleman with:  1. Prostate cancer diagnosed in 2010. He had a Gleason score of 3+4 = 7 and a PSA of 4.4. His disease was in 2 cores and the biopsy. He received external beam radiation as a definitive modality. He developed recurrent disease with retroperitoneal adenopathy and a bony metastasis. His PSA was up to 202. He was treated with androgen deprivation and PSA was down to 18. He subsequently developed castration resistant disease with PSA up to 90.   He is status post Xtandi 160 mg daily started in May 2016 with therapy discontinued in December 2017 because progression of disease. PSA at that time was 28.0.  He is currently on Zytiga that was started in December 2017 and continues to tolerated it well.  His PSA continues to rise with a doubling time close to 3 months. Risks and benefits  of continuing this medication was reviewed today. The plan is to continue Zytiga for one more month and restage him at that time with a CT scan and a bone scan. Options of treatment at that time will be Xofigo versus systemic chemotherapy. Given his excellent performance status and excellent quality of life and he would like to avoid chemotherapy at this time it is possible.   2. Androgen depravation therapy: I recommended this to continue indefinitely.   3. Bone directed therapy: This will be addressed again after his next bone scan.  4. Atrial fibrillation: He is currently on Eliquis without bleeding complications.  5. Hypertension: His blood pressure readings were within normal range at home. They are elevated today that he reports in between visits his blood pressure is normal.  6. Hypokalemia:. Potassium repeated today and currently pending.  7. Liver function surveillance: His function test continues to be within normal range.  8. Follow-up: Will be in 5 weeks follow on his progress.  [facility], MD 6/19/201810:12 AM

## 2016-08-03 ENCOUNTER — Telehealth: Payer: Self-pay | Admitting: *Deleted

## 2016-08-03 LAB — PSA: Prostate Specific Ag, Serum: 64.2 ng/mL — ABNORMAL HIGH (ref 0.0–4.0)

## 2016-08-03 NOTE — Telephone Encounter (Signed)
As noted below by Dr. Alen Blew, I informed him of his PSA level. Patient verbalized understanding.

## 2016-08-03 NOTE — Telephone Encounter (Signed)
-----   Message from [name], MD sent at 08/03/2016  8:39 AM EDT ----- Please let him know his PSA slightly up. No change.

## 2016-08-26 ENCOUNTER — Other Ambulatory Visit: Payer: Self-pay | Admitting: Oncology

## 2016-09-01 ENCOUNTER — Encounter: Payer: Self-pay | Admitting: *Deleted

## 2016-09-01 ENCOUNTER — Telehealth: Payer: Self-pay | Admitting: *Deleted

## 2016-09-01 NOTE — Telephone Encounter (Signed)
Patient calling central scheduling asking if his scans were scheduled yet. Central scheduling working on prior [place] with [employer]. They should be scheduled by the 24th just after his lab appt. Patient verbalizes understanding.

## 2016-09-06 ENCOUNTER — Encounter (HOSPITAL_COMMUNITY)
Admission: RE | Admit: 2016-09-06 | Discharge: 2016-09-06 | Disposition: A | Discharge status: Home / Self Care | Payer: Medicare Other | Source: Ambulatory Visit | Attending: Oncology | Admitting: Oncology

## 2016-09-06 ENCOUNTER — Ambulatory Visit (HOSPITAL_COMMUNITY)
Admission: RE | Admit: 2016-09-06 | Discharge: 2016-09-06 | Disposition: A | Discharge status: Home / Self Care | Payer: Medicare Other | Source: Ambulatory Visit | Attending: Oncology | Admitting: Oncology

## 2016-09-06 ENCOUNTER — Encounter (HOSPITAL_COMMUNITY): Payer: Self-pay

## 2016-09-06 DIAGNOSIS — I700 Atherosclerosis of aorta: Secondary | ICD-10-CM

## 2016-09-06 DIAGNOSIS — C7951 Secondary malignant neoplasm of bone: Secondary | ICD-10-CM

## 2016-09-06 DIAGNOSIS — I2510 Atherosclerotic heart disease of native coronary artery without angina pectoris: Secondary | ICD-10-CM | POA: Insufficient documentation

## 2016-09-06 DIAGNOSIS — N200 Calculus of kidney: Secondary | ICD-10-CM

## 2016-09-06 DIAGNOSIS — C61 Malignant neoplasm of prostate: Secondary | ICD-10-CM | POA: Insufficient documentation

## 2016-09-06 MED ORDER — TECHNETIUM TC 99M MEDRONATE IV KIT
20.0000 | PACK | Freq: Once | INTRAVENOUS | Status: AC | PRN
  Administered 2016-09-06: 19.9 via INTRAVENOUS

## 2016-09-06 MED ORDER — IOPAMIDOL (ISOVUE-300) INJECTION 61%
100.0000 mL | Freq: Once | INTRAVENOUS | Status: AC | PRN
  Administered 2016-09-06: 100 mL via INTRAVENOUS

## 2016-09-07 ENCOUNTER — Encounter: Payer: Self-pay | Admitting: *Deleted

## 2016-09-08 ENCOUNTER — Ambulatory Visit (HOSPITAL_BASED_OUTPATIENT_CLINIC_OR_DEPARTMENT_OTHER): Payer: Medicare Other | Admitting: Oncology

## 2016-09-08 VITALS — BP 179/87 | HR 85 | Temp 97.8°F | Resp 18 | Ht 71.0 in | Wt 230.2 lb

## 2016-09-08 DIAGNOSIS — I4891 Unspecified atrial fibrillation: Secondary | ICD-10-CM

## 2016-09-08 DIAGNOSIS — I10 Essential (primary) hypertension: Secondary | ICD-10-CM | POA: Diagnosis not present

## 2016-09-08 DIAGNOSIS — C7951 Secondary malignant neoplasm of bone: Secondary | ICD-10-CM | POA: Diagnosis not present

## 2016-09-08 DIAGNOSIS — E291 Testicular hypofunction: Secondary | ICD-10-CM | POA: Diagnosis not present

## 2016-09-08 DIAGNOSIS — R609 Edema, unspecified: Secondary | ICD-10-CM | POA: Diagnosis not present

## 2016-09-08 DIAGNOSIS — C61 Malignant neoplasm of prostate: Secondary | ICD-10-CM

## 2016-09-08 NOTE — Progress Notes (Signed)
Hematology and Oncology Follow Up Visit  [name] [name] [id number] November 22, 1940 76 y.o. 09/08/2016 10:48 AM [name], [name] [name], [name], [name], MD   Principle Diagnosis: 75 year old gentleman with prostate cancer diagnosed in 2010. He had a Gleason score of 3+4 = 7 and a PSA of 4.4. His disease was in 2 cores and the biopsy. 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   Prior Therapy: He was treated with external beam radiation completed in October 2010. His PSA remain under reasonable control to about 2014 when his PSA rose to 165 and subsequently was up to 202. He is imaging studies including a CT scan of the abdomen and pelvis which showed 07/15/2013 to have a 3.0 x 2.1 cm retroperitoneal adenopathy. A left retrocrural lymph node was measuring 1.1 cm. A bone scan at that time showed a single focus of abnormal uptake in the left posterior fourth rib. No clear CT correlation which could suggest trauma.He was started on Firmagon by Dr. Jeffie Pollock in May 2015 and his PSA dropped to 18 in October 2015. His PSA in January 2016 was down to 17.48 and in April 2016 up again to 48.52. Testosterone level was at a castrate level of 16. Staging workup continue to show metastatic bony disease without any major visceral involvement. He developed castration resistant disease with a rising PSA up to 48. Xtandi 160 mg daily started in May 2016. Therapy discontinued in November 2017 because of progression of disease.  Current therapy:  Androgen deprivation under the care of Dr. Jeffie Pollock. Zytiga 1000 mg daily with prednisone started in December 2017.   Interim History:  [name] [name] presents today for a follow-up visit with his wife. Since his last visit, he reports no major changes in his health. He does report periodic diffuse bone pain which has not dramatically changed. He continues to take Zytiga without any recent complications. He denied any excessive fatigue, weight loss but does report mild  edema. He denied any hot flashes or any GI complications. He denied any bone pain or pathological fractures. His performance status remains reasonable without any decline. He does report exercising periodically despite his periodic bone pain.    He does not report any headaches, blurry vision, double vision or alteration of mental status. He does not report any fevers, chills, sweats or weight loss. He does not report any chest pain, palpitation, orthopnea or PND. Does not report any cough or shortness of breath. He does not report any nausea, vomiting, abdominal pain, change in his bowel habits. He does not report any urgency, hesitancy. Does not report any hematuria or dysuria. Rest of his review of systems unremarkable.  Medications: I have reviewed the patient's current medications.  Current Outpatient Prescriptions  Medication Sig Dispense Refill  . azelastine (ASTELIN) 0.1 % nasal spray Place 1 spray into both nostrils 2 (two) times daily. Use in each nostril as directed    . ELIQUIS 5 MG TABS tablet TAKE 1 TABLET BY MOUTH TWO  TIMES DAILY 180 tablet 3  . fluticasone (FLONASE) 50 MCG/ACT nasal spray Place 2 sprays into both nostrils daily.     . hydrALAZINE (APRESOLINE) 50 MG tablet Take 50 mg by mouth 2 (two) times daily.    Marland Kitchen latanoprost (XALATAN) 0.005 % ophthalmic solution Place 1 drop into both eyes at bedtime.     Marland Kitchen losartan (COZAAR) 50 MG tablet Take 50 mg by mouth daily.     . meclizine (ANTIVERT) 25 MG tablet Take 25  mg by mouth 3 (three) times daily as needed for dizziness.    . metFORMIN (GLUCOPHAGE) 500 MG tablet Take 500 mg by mouth daily with breakfast.     . metoprolol (LOPRESSOR) 100 MG tablet Take 1 tablet (100 mg total) by mouth 2 (two) times daily. 180 tablet 3  . predniSONE (DELTASONE) 5 MG tablet Take 1 tablet (5 mg total) by mouth daily with breakfast. 90 tablet 3  . simvastatin (ZOCOR) 20 MG tablet Take 20 mg by mouth daily at 6 PM.     . ZYTIGA 250 MG tablet TAKE 4  TABLETS (1,000MG ) BY MOUTH ONCE DAILY ON AN EMPTY STOMACH 1 HOUR BEFORE OR 2 HOURS AFTER A MEAL 120 tablet 0   No current facility-administered medications for this visit.      Allergies: No Known Allergies  Past Medical History, Surgical history, Social history, and Family History were reviewed and updated.   Physical Exam: Blood pressure (!) 179/87, pulse 85, temperature 97.8 F (36.6 C), temperature source Oral, resp. rate 18, height 5\' 11"  (1.803 m), weight 230 lb 3.2 oz (104.4 kg), SpO2 98 %. ECOG: 1 General appearance: Well-appearing gentleman without distress. Head: Normocephalic, without obvious abnormality no oral ulcers or lesions. Neck: no adenopathy no thyromegaly. Lymph nodes: Cervical, supraclavicular, and axillary nodes normal. Heart:regular rate and rhythm, S1, S2 normal, no murmur, click, rub or gallop Lung:chest clear, no wheezing, rales, normal symmetric air entry no dullness to percussion. Abdomin: soft, non-tender, without masses or organomegaly no shifting dullness or ascites. EXT:no erythema, induration, or nodules   Lab Results: Lab Results  Component Value Date   WBC 7.4 08/02/2016   HGB 12.2 (L) 08/02/2016   HCT 36.3 (L) 08/02/2016   MCV 91.0 08/02/2016   PLT 188 08/02/2016     Chemistry      Component Value Date/Time   NA 137 08/02/2016 0931   K 3.4 (L) 08/02/2016 0931   CL 101 01/27/2015 1719   CO2 26 08/02/2016 0931   BUN 16.0 08/02/2016 0931   CREATININE 0.9 08/02/2016 0931      Component Value Date/Time   CALCIUM 9.3 08/02/2016 0931   ALKPHOS 101 08/02/2016 0931   AST 19 08/02/2016 0931   ALT 16 08/02/2016 0931   BILITOT 0.88 08/02/2016 0931      Results for [name] [name] (MRN [id number]) as of 09/08/2016 10:25  Ref. Range 06/29/2016 09:37 08/02/2016 09:31  Prostate Specific Ag, Serum Latest Ref Range: 0.0 - 4.0 ng/mL 60.3 (H) 64.2 (H)    His last PSA obtained on 09/06/2016 was 82.9. Her NA was 1.4 with a hemoglobin of  11.9.   EXAM: CT ABDOMEN AND PELVIS WITH CONTRAST  TECHNIQUE: Multidetector CT imaging of the abdomen and pelvis was performed using the standard protocol following bolus administration of intravenous contrast.  CONTRAST:  158mL ISOVUE-300 IOPAMIDOL (ISOVUE-300) INJECTION 61%  COMPARISON:  09/06/2016 bone scan.  Prior CT of 01/11/2016.  FINDINGS: Lower chest: Left base volume loss. Moderate-to-marked cardiomegaly, without pericardial or pleural effusion. Multivessel coronary artery atherosclerosis.  Hepatobiliary: Normal liver. Normal gallbladder, without biliary ductal dilatation.  Pancreas: Normal, without mass or ductal dilatation.  Spleen: Normal in size, without focal abnormality.  Adrenals/Urinary Tract: Normal adrenal glands. Bilateral renal collecting system calculi, without obstruction. Bilateral too small to characterize renal lesions. Normal ureters and urinary bladder.  Stomach/Bowel: Normal stomach, without wall thickening. Extensive colonic diverticulosis. Normal terminal ileum and appendix. Normal small bowel.  Vascular/Lymphatic: Aortic and branch vessel atherosclerosis. No abdominopelvic  adenopathy.  Reproductive: Radiation seeds in the prostate.  Other: No significant free fluid. Right larger than left small fat containing inguinal hernias. Periumbilical fat containing ventral abdominal wall hernia.  Musculoskeletal: Left iliac 14 mm sclerotic lesion image 69/series 2 is similar. Mild increase in sclerosis involving the L3 and L1 vertebral bodies. T12 sclerosis is also progressive, primarily posteriorly.  IMPRESSION: 1. Progressive osseous metastasis, primarily evidenced by increased sclerosis involving the thoracolumbar spine. 2. No soft tissue metastasis identified. 3. Bilateral nephrolithiasis. 4. Coronary artery atherosclerosis.   EXAM: NUCLEAR MEDICINE WHOLE BODY BONE SCAN  TECHNIQUE: Whole body anterior and posterior  images were obtained approximately 3 hours after intravenous injection of radiopharmaceutical.  RADIOPHARMACEUTICALS:  19.9 mCi Technetium-56m MDP IV  COMPARISON:  Bone scan 01/11/2016.  FINDINGS: Bilateral renal function excretion. Progressive disease is noted throughout the thoracic and lumbar spine as well as about the sacrum and bilateral iliac wings. Increased activity noted about right lower anterior ribs and left upper anterior rib. These findings suggest an worsening metastatic disease. Previously identified punctate left lower anterior rib lesions not identified, these may have been posttraumatic. Activity noted in shoulders and the most likely degenerative. Similar findings noted on prior exam. Activity noted in the mandible and deeper related to dental disease. Similar findings noted on prior exam .  IMPRESSION: Findings consist with progressive metastatic disease throughout the ribs, thoracolumbar spine, sacrum, and both iliac wings.    Assessment and Plan:   76 year old gentleman with:  1. Prostate cancer diagnosed in 2010. He had a Gleason score of 3+4 = 7 and a PSA of 4.4. His disease was in 2 cores and the biopsy. He received external beam radiation as a definitive modality. He developed recurrent disease with retroperitoneal adenopathy and a bony metastasis. His PSA was up to 202. He was treated with androgen deprivation and PSA was down to 18. He subsequently developed castration resistant disease with PSA up to 73.   He is status post Xtandi 160 mg daily started in May 2016 with therapy discontinued in December 2017 because progression of disease. PSA at that time was 28.0.  He is currently on Zytiga that was started in December 2017 and continues to tolerated it well.  His CT scan and bone scan on 09/06/2016 was reviewed which showed progression of disease and bone only. His PSA continues to increase at this time up to 82.  Options of therapy were  reviewed today which include continuing Zytiga alone, adding Xofigo or potentially systemic chemotherapy. Risks and benefits of all these options were debated today and he would like to try Xofigo. I believe this is a reasonable option given his bone disease only and bone symptoms. Systemic chemotherapy can be deferred to a later date. I urged him to continue Zytiga at this time given the fact his PSA has reasonably been maintained.     2. Androgen depravation therapy: I recommended this to continue indefinitely.   3. Bone directed therapy: This will be addressed again and a future.  4. Atrial fibrillation: He is currently on Eliquis without bleeding complications.  5. Hypertension: His blood pressure readings were within normal range at home.   6. Hypokalemia:. Potassium has been within normal range most recently at 3.7 in July 2018.  7. Liver function surveillance: His function test continues to be within normal range.  8. Follow-up: Will be in 5 weeks follow on his progress.  [facility], MD 7/26/201810:48 AM

## 2016-09-19 ENCOUNTER — Ambulatory Visit
Admission: RE | Admit: 2016-09-19 | Discharge: 2016-09-19 | Disposition: A | Discharge status: Home / Self Care | Payer: Medicare Other | Source: Ambulatory Visit | Attending: Radiation Oncology | Admitting: Radiation Oncology

## 2016-09-19 ENCOUNTER — Encounter: Payer: Self-pay | Admitting: Radiation Oncology

## 2016-09-19 VITALS — BP 161/106 | HR 95 | Temp 97.8°F | Wt 227.3 lb

## 2016-09-19 DIAGNOSIS — C7951 Secondary malignant neoplasm of bone: Secondary | ICD-10-CM | POA: Diagnosis not present

## 2016-09-19 DIAGNOSIS — C61 Malignant neoplasm of prostate: Secondary | ICD-10-CM | POA: Insufficient documentation

## 2016-09-19 DIAGNOSIS — Z7984 Long term (current) use of oral hypoglycemic drugs: Secondary | ICD-10-CM | POA: Insufficient documentation

## 2016-09-19 DIAGNOSIS — I10 Essential (primary) hypertension: Secondary | ICD-10-CM | POA: Insufficient documentation

## 2016-09-19 DIAGNOSIS — Z79899 Other long term (current) drug therapy: Secondary | ICD-10-CM | POA: Insufficient documentation

## 2016-09-19 DIAGNOSIS — Z7901 Long term (current) use of anticoagulants: Secondary | ICD-10-CM | POA: Diagnosis not present

## 2016-09-19 DIAGNOSIS — Z800 Family history of malignant neoplasm of digestive organs: Secondary | ICD-10-CM | POA: Insufficient documentation

## 2016-09-19 DIAGNOSIS — E119 Type 2 diabetes mellitus without complications: Secondary | ICD-10-CM | POA: Insufficient documentation

## 2016-09-19 DIAGNOSIS — Z923 Personal history of irradiation: Secondary | ICD-10-CM | POA: Diagnosis not present

## 2016-09-19 NOTE — Consult Note (Signed)
NEW PATIENT EVALUATION  Name: [name]  MRN: [id number]  Date:   09/19/2016     DOB: 1940-08-30   This 76 y.o. 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  REFERRING PHYSICIAN: [name], MD  CHIEF COMPLAINT:  Chief Complaint  Patient presents with  . Prostate Cancer    Consult    DIAGNOSIS: The encounter diagnosis was Bone metastasis ([place]).   PREVIOUS INVESTIGATIONS:  Bone scan and CT scans reviewed Pathology report reviewed Clinical notes reviewed  HPI: patient is a 76 year old male whose history dates back to 2010 when was diagnosed with a Gleason 7 (3+4) adenocarcinoma the prostate presenting the PSA 4.4. He at that time underwent external beam radiation therapy completed in October 2010. He developed recurrence in 2015 with retroperitoneal adenopathy and at that time a single focus of abnormal uptake in the left posterior fourth rib consistent with metastatic disease. He has been treated with Mills Koller.his PSAs can continue to rise evidence of castrate resistant cancer most recently up to 67. He is currently on androgen deprivation therapy as well as Zytiga. He continues to have pain for which she takes nonsteroidal anti-inflammatory agents with mixed results. He is also starting to have difficulty with ambulation secondary to his back pain. Recent bone scanas well as CT scan shows progressive metastatic disease throughout the ribs thoracolumbar lower lumbar spine and iliac wings. He is seen today for clinical opinion aboutXofigo.  PLANNED TREATMENT REGIMEN: Xofigo  PAST MEDICAL HISTORY:  has a past medical history of Diabetes mellitus without complication ([place]); Hypertension; and Prostate cancer ([place]).    PAST SURGICAL HISTORY: History reviewed. No pertinent surgical history.  FAMILY HISTORY: family history includes Colon cancer in his  sister.  SOCIAL HISTORY:  reports that he has never smoked. He has never used smokeless tobacco. He reports that he does not drink alcohol or use drugs.  ALLERGIES: Patient has no known allergies.  MEDICATIONS:  Current Outpatient Prescriptions  Medication Sig Dispense Refill  . azelastine (ASTELIN) 0.1 % nasal spray Place 1 spray into both nostrils 2 (two) times daily. Use in each nostril as directed    . ELIQUIS 5 MG TABS tablet TAKE 1 TABLET BY MOUTH TWO  TIMES DAILY 180 tablet 3  . fluticasone (FLONASE) 50 MCG/ACT nasal spray Place 2 sprays into both nostrils daily.     . hydrALAZINE (APRESOLINE) 50 MG tablet Take 50 mg by mouth 2 (two) times daily.    Marland Kitchen latanoprost (XALATAN) 0.005 % ophthalmic solution Place 1 drop into both eyes at bedtime.     Marland Kitchen losartan (COZAAR) 50 MG tablet Take 50 mg by mouth daily.     . meclizine (ANTIVERT) 25 MG tablet Take 25 mg by mouth 3 (three) times daily as needed for dizziness.    . metFORMIN (GLUCOPHAGE) 500 MG tablet Take 500 mg by mouth daily with breakfast.     . metoprolol (LOPRESSOR) 100 MG tablet Take 1 tablet (100 mg total) by mouth 2 (two) times daily. 180 tablet 3  . predniSONE (DELTASONE) 5 MG tablet Take 1 tablet (5 mg total) by mouth daily with breakfast. 90 tablet 3  . simvastatin (ZOCOR) 20 MG tablet Take 20 mg by mouth daily at 6 PM.     . ZYTIGA 250 MG tablet TAKE 4 TABLETS (1,000MG ) BY MOUTH ONCE DAILY ON AN EMPTY STOMACH 1 HOUR BEFORE OR 2 HOURS  AFTER A MEAL 120 tablet 0   No current facility-administered medications for this encounter.     ECOG PERFORMANCE STATUS:  1 - Symptomatic but completely ambulatory  REVIEW OF SYSTEMS: except for the bone pain slight intermittent diarrhea Patient denies any weight loss, fatigue, weakness, fever, chills or night sweats. Patient denies any loss of vision, blurred vision. Patient denies any ringing  of the ears or hearing loss. No irregular heartbeat. Patient denies heart murmur or history of  fainting. Patient denies any chest pain or pain radiating to her upper extremities. Patient denies any shortness of breath, difficulty breathing at night, cough or hemoptysis. Patient denies any swelling in the lower legs. Patient denies any nausea vomiting, vomiting of blood, or coffee ground material in the vomitus. Patient denies any stomach pain. Patient states has had normal bowel movements no significant constipation or diarrhea. Patient denies any dysuria, hematuria or significant nocturia. Patient denies any problems walking, swelling in the joints or loss of balance. Patient denies any skin changes, loss of hair or loss of weight. Patient denies any excessive worrying or anxiety or significant depression. Patient denies any problems with insomnia. Patient denies excessive thirst, polyuria, polydipsia. Patient denies any swollen glands, patient denies easy bruising or easy bleeding. Patient denies any recent infections, allergies or URI. Patient "s visual fields have not changed significantly in recent time.    PHYSICAL EXAM: BP (!) 161/106   Pulse 95   Temp 97.8 F (36.6 C)   Wt 227 lb 4.7 oz (103.1 kg)   BMI 31.70 kg/m  Deep palpation of the spine does not elicit pain range of motion his lower external wrist does elicit some pain in his lower back. Motor and sensory levels are equal and symmetric. Well-developed well-nourished patient in NAD. HEENT reveals PERLA, EOMI, discs not visualized.  Oral cavity is clear. No oral mucosal lesions are identified. Neck is clear without evidence of cervical or supraclavicular adenopathy. Lungs are clear to A&P. Cardiac examination is essentially unremarkable with regular rate and rhythm without murmur rub or thrill. Abdomen is benign with no organomegaly or masses noted. Motor sensory and DTR levels are equal and symmetric in the upper and lower extremities. Cranial nerves II through XII are grossly intact. Proprioception is intact. No peripheral adenopathy  or edema is identified. No motor or sensory levels are noted. Crude visual fields are within normal range.  LABORATORY DATA: pathology reports reviewed    RADIOLOGY RESULTS:bone scan and CT scans reviewed   IMPRESSION: progressive castrate resistant painful bony metastasis in 76 year old male with known stage IV prostate cancer  PLAN: at this time I have recommendedXofigo treatment. Would treat in 6 doses over 6 months. I have reviewed the patient details with patient and his wife concerning possible side effects including allergic reaction bone marrow suppression possible diarrhea or GI upset with the patient in detail. We will arrange for his blood counts to be performed in [place] at [facility] office. Will make arrangements for his first injection with our nuclear medicine department. Patient wife both seem to comprehend her treatment plan well and hasn't except treatments.  I would like to take this opportunity to thank you for allowing me to participate in the care of your patient.Armstead Peaks., MD

## 2016-09-26 ENCOUNTER — Encounter: Payer: Self-pay | Admitting: *Deleted

## 2016-09-29 ENCOUNTER — Other Ambulatory Visit: Payer: Self-pay | Admitting: Radiation Oncology

## 2016-09-29 DIAGNOSIS — C7951 Secondary malignant neoplasm of bone: Principal | ICD-10-CM

## 2016-09-29 DIAGNOSIS — C61 Malignant neoplasm of prostate: Secondary | ICD-10-CM

## 2016-10-02 ENCOUNTER — Other Ambulatory Visit: Payer: Self-pay | Admitting: Oncology

## 2016-10-18 ENCOUNTER — Ambulatory Visit (HOSPITAL_BASED_OUTPATIENT_CLINIC_OR_DEPARTMENT_OTHER): Payer: Medicare Other | Admitting: Oncology

## 2016-10-18 ENCOUNTER — Other Ambulatory Visit (HOSPITAL_BASED_OUTPATIENT_CLINIC_OR_DEPARTMENT_OTHER): Payer: Medicare Other

## 2016-10-18 ENCOUNTER — Telehealth: Payer: Self-pay | Admitting: Oncology

## 2016-10-18 ENCOUNTER — Other Ambulatory Visit: Payer: Self-pay | Admitting: *Deleted

## 2016-10-18 VITALS — BP 162/94 | HR 94 | Temp 97.8°F | Resp 18 | Ht 71.0 in | Wt 226.3 lb

## 2016-10-18 DIAGNOSIS — C61 Malignant neoplasm of prostate: Secondary | ICD-10-CM

## 2016-10-18 DIAGNOSIS — Z7901 Long term (current) use of anticoagulants: Secondary | ICD-10-CM | POA: Diagnosis not present

## 2016-10-18 DIAGNOSIS — E876 Hypokalemia: Secondary | ICD-10-CM | POA: Diagnosis not present

## 2016-10-18 DIAGNOSIS — C7951 Secondary malignant neoplasm of bone: Secondary | ICD-10-CM

## 2016-10-18 DIAGNOSIS — E291 Testicular hypofunction: Secondary | ICD-10-CM | POA: Diagnosis not present

## 2016-10-18 LAB — COMPREHENSIVE METABOLIC PANEL
ALT: 17 U/L (ref 0–55)
ANION GAP: 8 meq/L (ref 3–11)
AST: 22 U/L (ref 5–34)
Albumin: 3.7 g/dL (ref 3.5–5.0)
Alkaline Phosphatase: 107 U/L (ref 40–150)
BILIRUBIN TOTAL: 0.86 mg/dL (ref 0.20–1.20)
BUN: 16.6 mg/dL (ref 7.0–26.0)
CHLORIDE: 103 meq/L (ref 98–109)
CO2: 29 meq/L (ref 22–29)
Calcium: 9.6 mg/dL (ref 8.4–10.4)
Creatinine: 1 mg/dL (ref 0.7–1.3)
EGFR: 72 mL/min/{1.73_m2} — ABNORMAL LOW (ref >90)
Glucose: 125 mg/dl (ref 70–140)
Potassium: 3.5 mEq/L (ref 3.5–5.1)
Sodium: 141 mEq/L (ref 136–145)
Total Protein: 6.9 g/dL (ref 6.4–8.3)

## 2016-10-18 LAB — CBC WITH DIFFERENTIAL/PLATELET
BASO%: 0.1 % (ref 0.0–2.0)
BASOS ABS: 0 10*3/uL (ref 0.0–0.1)
EOS ABS: 0.1 10*3/uL (ref 0.0–0.5)
EOS%: 0.7 % (ref 0.0–7.0)
HCT: 36.9 % — ABNORMAL LOW (ref 38.4–49.9)
HGB: 12.4 g/dL — ABNORMAL LOW (ref 13.0–17.1)
LYMPH%: 14.1 % (ref 14.0–49.0)
MCH: 30.9 pg (ref 27.2–33.4)
MCHC: 33.6 g/dL (ref 32.0–36.0)
MCV: 92 fL (ref 79.3–98.0)
MONO#: 0.6 10*3/uL (ref 0.1–0.9)
MONO%: 6.5 % (ref 0.0–14.0)
NEUT#: 7.1 10*3/uL — ABNORMAL HIGH (ref 1.5–6.5)
NEUT%: 78.6 % — AB (ref 39.0–75.0)
PLATELETS: 181 10*3/uL (ref 140–400)
RBC: 4.01 10*6/uL — AB (ref 4.20–5.82)
RDW: 13.3 % (ref 11.0–14.6)
WBC: 9 10*3/uL (ref 4.0–10.3)
lymph#: 1.3 10*3/uL (ref 0.9–3.3)

## 2016-10-18 MED ORDER — PREDNISONE 5 MG PO TABS: 5.0000 mg | ORAL_TABLET | Freq: Every day | ORAL | 3 refills | Status: DC

## 2016-10-18 NOTE — Telephone Encounter (Signed)
Scheduled appt per 9/4 los - Gave patient AVS and calender per los.  

## 2016-10-18 NOTE — Progress Notes (Signed)
Hematology and Oncology Follow Up Visit  HERMES WAFER [id number] 1940-04-14 76 y.o. 10/18/2016 10:26 AM [name], [name], [name], [name], MD   Principle Diagnosis: 76 year old gentleman with prostate cancer diagnosed in 2010. He had a Gleason score of 3+4 = 7 and a PSA of 4.4. His disease was in 2 cores and the biopsy. 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   Prior Therapy: He was treated with external beam radiation completed in October 2010. His PSA remain under reasonable control to about 2014 when his PSA rose to 165 and subsequently was up to 202. He is imaging studies including a CT scan of the abdomen and pelvis which showed 07/15/2013 to have a 3.0 x 2.1 cm retroperitoneal adenopathy. A left retrocrural lymph node was measuring 1.1 cm. A bone scan at that time showed a single focus of abnormal uptake in the left posterior fourth rib. No clear CT correlation which could suggest trauma.He was started on Firmagon by Dr. Jeffie Pollock in May 2015 and his PSA dropped to 18 in October 2015. His PSA in January 2016 was down to 17.48 and in April 2016 up again to 48.52. Testosterone level was at a castrate level of 16. Staging workup continue to show metastatic bony disease without any major visceral involvement. He developed castration resistant disease with a rising PSA up to 48. Xtandi 160 mg daily started in May 2016. Therapy discontinued in November 2017 because of progression of disease.  Current therapy:  Androgen deprivation under the care of Dr. Jeffie Pollock. Zytiga 1000 mg daily with prednisone started in December 2017.  Xofigo to start on 10/26/2016. This will be given monthly for a total of 6 months.  Interim History:  Mr. Satter presents today for a follow-up visit with his wife. Since his last visit, he reports doing well without any recent complaints. He was evaluated by radiation oncology for [facility] and he is to start that in the near future. He continues to  take Zytiga without any recent complications. He denied any excessive fatigue, weight loss but does report mild edema. He denied any hot flashes or any GI complications. He denied any bone pain or pathological fractures. He remains active attending to activities of daily living. He denied any recent hospitalization or illnesses.    He does not report any headaches, blurry vision, double vision or alteration of mental status. He does not report any fevers, chills, sweats or weight loss. He does not report any chest pain, palpitation, orthopnea or PND. Does not report any cough or shortness of breath. He does not report any nausea, vomiting, abdominal pain, change in his bowel habits. He does not report any urgency, hesitancy. Does not report any hematuria or dysuria. Rest of his review of systems unremarkable.  Medications: I have reviewed the patient's current medications.  Current Outpatient Prescriptions  Medication Sig Dispense Refill  . azelastine (ASTELIN) 0.1 % nasal spray Place 1 spray into both nostrils 2 (two) times daily. Use in each nostril as directed    . ELIQUIS 5 MG TABS tablet TAKE 1 TABLET BY MOUTH TWO  TIMES DAILY 180 tablet 3  . fluticasone (FLONASE) 50 MCG/ACT nasal spray Place 2 sprays into both nostrils daily.     . hydrALAZINE (APRESOLINE) 50 MG tablet Take 50 mg by mouth 2 (two) times daily.    Marland Kitchen latanoprost (XALATAN) 0.005 % ophthalmic solution Place 1 drop into both eyes at bedtime.     Marland Kitchen losartan (COZAAR) 50  MG tablet Take 50 mg by mouth daily.     . meclizine (ANTIVERT) 25 MG tablet Take 25 mg by mouth 3 (three) times daily as needed for dizziness.    . metFORMIN (GLUCOPHAGE) 500 MG tablet Take 500 mg by mouth daily with breakfast.     . metoprolol (LOPRESSOR) 100 MG tablet Take 1 tablet (100 mg total) by mouth 2 (two) times daily. 180 tablet 3  . simvastatin (ZOCOR) 20 MG tablet Take 20 mg by mouth daily at 6 PM.     . ZYTIGA 250 MG tablet TAKE 4 TABLETS (1,000MG ) BY  MOUTH ONCE DAILY ON AN EMPTY STOMACH 1 HOUR BEFORE OR 2 HOURS AFTER A MEAL 120 tablet 0  . predniSONE (DELTASONE) 5 MG tablet Take 1 tablet (5 mg total) by mouth daily with breakfast. 90 tablet 3   No current facility-administered medications for this visit.      Allergies: No Known Allergies  Past Medical History, Surgical history, Social history, and Family History were reviewed and updated.   Physical Exam: Blood pressure (!) 162/94, pulse 94, temperature 97.8 F (36.6 C), temperature source Oral, resp. rate 18, height 5\' 11"  (1.803 m), weight 226 lb 4.8 oz (102.6 kg), SpO2 98 %. ECOG: 1 General appearance: Alert, awake gentleman without distress. Head: Normocephalic, without obvious abnormality no oral thrush or ulcers. Neck: no adenopathy no thyromegaly. Lymph nodes: Cervical, supraclavicular, and axillary nodes normal. Heart:regular rate and rhythm, S1, S2 normal, no murmur, click, rub or gallop Lung:chest clear, no wheezing, rales, normal symmetric air entry no dullness to percussion. Abdomin: soft, non-tender, without masses or organomegaly no rebound or guarding. EXT:no erythema, induration, or nodules   Lab Results: Lab Results  Component Value Date   WBC 9.0 10/18/2016   HGB 12.4 (L) 10/18/2016   HCT 36.9 (L) 10/18/2016   MCV 92.0 10/18/2016   PLT 181 10/18/2016     Chemistry      Component Value Date/Time   NA 137 08/02/2016 0931   K 3.4 (L) 08/02/2016 0931   CL 101 01/27/2015 1719   CO2 26 08/02/2016 0931   BUN 16.0 08/02/2016 0931   CREATININE 0.9 08/02/2016 0931      Component Value Date/Time   CALCIUM 9.3 08/02/2016 0931   ALKPHOS 101 08/02/2016 0931   AST 19 08/02/2016 0931   ALT 16 08/02/2016 0931   BILITOT 0.88 08/02/2016 0931       Assessment and Plan:   76 year old gentleman with:  1. Prostate cancer diagnosed in 2010. He had a Gleason score of 3+4 = 7 and a PSA of 4.4. His disease was in 2 cores and the biopsy. He received external  beam radiation as a definitive modality. He developed recurrent disease with retroperitoneal adenopathy and a bony metastasis. His PSA was up to 202. He was treated with androgen deprivation and PSA was down to 18. He subsequently developed castration resistant disease with PSA up to 56.   He is status post Xtandi 160 mg daily started in May 2016 with therapy discontinued in December 2017 because progression of disease. PSA at that time was 28.0.  He is currently on Zytiga that was started in December 5852 without complications.  His CT scan and bone scan on 09/06/2016 showed progression of disease and bone only. His PSA continues to increase at this time up to 82.  The plan is to continue with Zytiga as well as [name] which is scheduled to start in the near future. He understands  his PSA might not show any immediate response and he will complete 6 months of this treatment.    2. Androgen depravation therapy: I recommended this to continue indefinitely.   3. Bone directed therapy: He will be a candidate for bone directed therapy after dental clearance. This will be addressed in future visits.  4. Atrial fibrillation: He is currently on Eliquis without bleeding complications.  5. Hypertension: His blood pressure readings were within normal range at home.   6. Hypokalemia: Potassium replaced and will continue to be monitored periodically.  7. Liver function surveillance: His function test continues to be within normal range.  8. Follow-up: Will be in 6 weeks follow on his progress.  Zola Button, MD 9/4/201810:26 AM

## 2016-10-19 ENCOUNTER — Telehealth: Payer: Self-pay | Admitting: *Deleted

## 2016-10-19 LAB — PSA: PROSTATE SPECIFIC AG, SERUM: 120.1 ng/mL — AB (ref 0.0–4.0)

## 2016-10-19 NOTE — Telephone Encounter (Signed)
As noted below by Dr. Alen Blew, I informed him of his PSA level is up but no changes for now. He verbalized understanding.

## 2016-10-19 NOTE — Telephone Encounter (Signed)
-----   Message from [name], MD sent at 10/19/2016  8:59 AM EDT ----- Please let him know his PSA is up but no changes for now.

## 2016-10-20 ENCOUNTER — Other Ambulatory Visit: Payer: Self-pay | Admitting: *Deleted

## 2016-10-20 ENCOUNTER — Inpatient Hospital Stay: Payer: Medicare Other | Attending: Radiation Oncology

## 2016-10-20 ENCOUNTER — Encounter: Payer: Self-pay | Admitting: *Deleted

## 2016-10-20 ENCOUNTER — Other Ambulatory Visit: Payer: Self-pay

## 2016-10-20 DIAGNOSIS — C61 Malignant neoplasm of prostate: Secondary | ICD-10-CM | POA: Insufficient documentation

## 2016-10-20 DIAGNOSIS — C7951 Secondary malignant neoplasm of bone: Principal | ICD-10-CM

## 2016-10-20 LAB — CBC WITH DIFFERENTIAL/PLATELET
BASOS PCT: 0 %
Basophils Absolute: 0 10*3/uL (ref 0–0.1)
EOS PCT: 1 %
Eosinophils Absolute: 0.1 10*3/uL (ref 0–0.7)
HCT: 36.7 % — ABNORMAL LOW (ref 40.0–52.0)
Hemoglobin: 12.8 g/dL — ABNORMAL LOW (ref 13.0–18.0)
LYMPHS PCT: 13 %
Lymphs Abs: 1.2 10*3/uL (ref 1.0–3.6)
MCH: 31.7 pg (ref 26.0–34.0)
MCHC: 35 g/dL (ref 32.0–36.0)
MCV: 90.7 fL (ref 80.0–100.0)
MONO ABS: 0.6 10*3/uL (ref 0.2–1.0)
Monocytes Relative: 7 %
NEUTROS ABS: 7.3 10*3/uL — AB (ref 1.4–6.5)
Neutrophils Relative %: 79 %
PLATELETS: 195 10*3/uL (ref 150–440)
RBC: 4.05 MIL/uL — ABNORMAL LOW (ref 4.40–5.90)
RDW: 13.7 % (ref 11.5–14.5)
WBC: 9.2 10*3/uL (ref 3.8–10.6)

## 2016-10-20 NOTE — Progress Notes (Signed)
Patient here for labs and weight  102.5kg

## 2016-10-25 ENCOUNTER — Ambulatory Visit (INDEPENDENT_AMBULATORY_CARE_PROVIDER_SITE_OTHER): Payer: Medicare Other | Admitting: Cardiovascular Disease

## 2016-10-25 ENCOUNTER — Encounter: Payer: Self-pay | Admitting: Cardiovascular Disease

## 2016-10-25 VITALS — BP 148/100 | HR 87 | Ht 71.0 in | Wt 226.4 lb

## 2016-10-25 DIAGNOSIS — I10 Essential (primary) hypertension: Secondary | ICD-10-CM

## 2016-10-25 DIAGNOSIS — C61 Malignant neoplasm of prostate: Secondary | ICD-10-CM | POA: Diagnosis not present

## 2016-10-25 DIAGNOSIS — I482 Chronic atrial fibrillation, unspecified: Secondary | ICD-10-CM

## 2016-10-25 MED ORDER — HYDRALAZINE HCL 50 MG PO TABS: 50.0000 mg | ORAL_TABLET | Freq: Two times a day (BID) | ORAL | 3 refills | Status: AC

## 2016-10-25 MED ORDER — METOPROLOL TARTRATE 100 MG PO TABS: 100.0000 mg | ORAL_TABLET | Freq: Two times a day (BID) | ORAL | 3 refills | Status: AC

## 2016-10-25 NOTE — Patient Instructions (Signed)

## 2016-10-25 NOTE — Progress Notes (Signed)
[name] [name] Date of Birth  07/29/1940       [name] [address], [place], [place]  [address]   [place],   [address] [place]   Fax  [phone]     Fax [phone]  Problem List: 1. Atrial fibrillation 2. Hypertension 3. Diabetes mellitus 4. Hyperlipidemia    [name] [name] presents today for newly discovered atrial fibrillation. He was sent to our office for a urology EKG. He was found to  have atrial fibrillation and was worked into my schedule for further evaluation.  His Primary medical doctor is [name] Heading, MD ( [place] ).  He does to see his medical doctor on a regular basis and has never been told about atrial fib.   He has no symptoms of atrial fibrillation. Cannot tell that his heartbeat is irregular. He exercises-walks 2 miles every day. He may have a little shortness breath when he walks up hills but this is very temporary.  He denies any chest pain, syncope, presyncope. He denies any PND or orthopnea.  CHADS VASC2  Score is 3 which corresponds to a high risk of stroke in atrial fibrillation.  Yearly risk of stroke without warfarin treatment is estimated at 3.2.    August 26, 2013:  [name] [name] is doing well - walking 2 miles a day most days of the week.   Occasionally can feel that his heart is irregular but is not .  Jan. 13, 2016:  [name] [name] is a 76 yo with hx of chronic atrial flutter ablation, diabetes mellitus, and hypertension. He has gained some weight since his last visit BP is a bit elevated.  BP is always better at home.  No CP  , dyspnea. Still walking 2 miles a day.  September 01, 2014: [name] [name] is doing well.  Walking every day .  No CP   Jan. 23, 2017:  doing well from a cardiac standpoint.  PSA has been going down.   Still very active, no CP or dyspena.  We have not attempted a cardioversion and he does not want to have one at this point  . He is completely asymptomatic.   Jan. 24, 2018:  Was in the hospital in [name]  2 months ago .  Was very dehydrated.      His HCTZ was stopped ,  Gave him IVF Had lost his appetite ( related to his prostate cancer drug)   Is back walking again .  Is regaining his strength.  BP is back up today .   He says the BP fluctuates.  Has lost 42 lbs.  BP at home and other offices have been normal .  Is on a new cancer med ( Zitiga) and is now on Prednisone   Sept. 11, 2018  [name] [name] is seen today . Getting an injection tomorrow for his prostate cancer. Is starting Xofigo ( Radium 233)  BP is high today here in the office.  Its normal at home. Watches his salt -     Current Outpatient Prescriptions  Medication Sig Dispense Refill  . ELIQUIS 5 MG TABS tablet TAKE 1 TABLET BY MOUTH TWO  TIMES DAILY 180 tablet 3  . latanoprost (XALATAN) 0.005 % ophthalmic solution Place 1 drop into both eyes at bedtime.     Marland Kitchen losartan (COZAAR) 50 MG tablet Take 50 mg by mouth daily.     Marland Kitchen  meclizine (ANTIVERT) 25 MG tablet Take 25 mg by mouth 3 (three) times daily as needed for dizziness.    . metFORMIN (GLUCOPHAGE) 500 MG tablet Take 500 mg by mouth daily with breakfast.     . predniSONE (DELTASONE) 5 MG tablet Take 1 tablet (5 mg total) by mouth daily with breakfast. 90 tablet 3  . simvastatin (ZOCOR) 20 MG tablet Take 20 mg by mouth daily at 6 PM.     . ZYTIGA 250 MG tablet TAKE 4 TABLETS (1,000MG ) BY MOUTH ONCE DAILY ON AN EMPTY STOMACH 1 HOUR BEFORE OR 2 HOURS AFTER A MEAL 120 tablet 0  . hydrALAZINE (APRESOLINE) 50 MG tablet Take 50 mg by mouth 2 (two) times daily.    . metoprolol (LOPRESSOR) 100 MG tablet Take 1 tablet (100 mg total) by mouth 2 (two) times daily. 180 tablet 3   No current facility-administered medications for this visit.      No Known Allergies  Past Medical History:  Diagnosis Date  . Diabetes mellitus without complication ([place])   . Hypertension   . Prostate cancer  ([place])     No past surgical history on file.  History  Smoking Status  . Never Smoker  Smokeless Tobacco  . Never Used    History  Alcohol Use No    Comment: drinks occasional beer    Family History  Problem Relation Age of Onset  . Colon cancer Sister     Reviw of Systems:  Reviewed in the HPI.  All other systems are negative.  Physical Exam: Blood pressure (!) 148/100, pulse 87, height 5\' 11"  (1.803 m), weight 226 lb 6.4 oz (102.7 kg). Wt Readings from Last 3 Encounters:  10/25/16 226 lb 6.4 oz (102.7 kg)  10/18/16 226 lb 4.8 oz (102.6 kg)  09/19/16 227 lb 4.7 oz (103.1 kg)    General: Well developed, well nourished, in no acute distress.  Head: Normocephalic, atraumatic, sclera non-icteric, mucus membranes are moist,  Neck: Supple. Carotids are 2 + without bruits. No JVD  Lungs: Clear  Heart: irreg. Irreg. Normal E0C1, soft systlic murmur Abdomen: Soft, non-tender, non-distended with normal bowel sounds. Msk:  Strength and tone are normal  Extremities: No clubbing or cyanosis. No edema.  Distal pedal pulses are 2+ and equal  Neuro: CN II - XII intact.  Alert and oriented X 3.  Psych:  Normal   ECG: Jan. 24, 2018:   Atrial fib with HR of 91.   ST / T wave abn.    Assessment / Plan:   1. Atrial fibrillation-  Has chronic AF .  Asymptomatic. Rate is well controlled.  I'll see him in 6  months for follow-up visit.  2. Hypertension - his blood pressure is elevated today but has been  well-controlled frequently over the past several months.   He was hospitalized in [name] with dehydration and hypotension.   HCTZ was stopped.  Losartan dose was lowered.    3. Diabetes mellitus  4. Hyperlipidemia - followed by his general medical doctor.  5. Prostate cancer: He is starting a new medication tomorrow which is an antibody directed radium Fowler, MD  10/25/2016 10:05 [address],  [place], [place]  [address] Pager [phone] Phone: [phone]; Fax: [phone]

## 2016-10-26 ENCOUNTER — Encounter
Admission: RE | Admit: 2016-10-26 | Discharge: 2016-10-26 | Disposition: A | Discharge status: Home / Self Care | Payer: Medicare Other | Source: Ambulatory Visit | Attending: Radiation Oncology | Admitting: Radiation Oncology

## 2016-10-26 ENCOUNTER — Ambulatory Visit: Admission: RE | Admit: 2016-10-26 | Payer: Medicare Other | Source: Ambulatory Visit | Admitting: Radiation Oncology

## 2016-10-26 DIAGNOSIS — C7951 Secondary malignant neoplasm of bone: Secondary | ICD-10-CM | POA: Insufficient documentation

## 2016-10-26 DIAGNOSIS — C61 Malignant neoplasm of prostate: Secondary | ICD-10-CM | POA: Diagnosis not present

## 2016-10-26 MED ORDER — RADIUM RA 223 DICHLORIDE 30 MCCI/ML IV SOLN
161.0000 | Freq: Once | INTRAVENOUS | Status: AC
  Administered 2016-10-26: 160.89 via INTRAVENOUS

## 2016-10-26 NOTE — Progress Notes (Signed)
Radiation Oncology Xofigo injection note  Name: [name] [name]   Date:   10/26/2016 MRN:  [id number] DOB: 1940/08/13    This 76 y.o. male presents to the clinic today for patient is a 76 year old male whose history dates back to 2010 when was diagnosed with a Gleason 7 (3+4) adenocarcinoma the prostate presenting the PSA 4.4. He at that time underwent external beam radiation therapy completed in October 2010. He developed recurrence in 2015 with retroperitoneal adenopathy and at that time a single focus of abnormal uptake in the left posterior fourth rib consistent with metastatic disease. He has been treated with Mills Koller.his PSAs can continue to rise evidence of castrate resistant cancer most recently up to 40. He is currently on androgen deprivation therapy as well as Zytiga. He continues to have pain for which she takes nonsteroidal anti-inflammatory agents with mixed results. He is also starting to have difficulty with ambulation secondary to his back pain. Recent bone scanas well as CT scan shows progressive metastatic disease throughout the ribs thoracolumbar lower lumbar spine and iliac wings. He is seen today for his firstXofigo injection. He is doing well no real side effects or complaints today.Marland Kitchen  REFERRING PROVIDER: Noreene Filbert, MD  HPI: As above.  COMPLICATIONS OF TREATMENT: none  FOLLOW UP COMPLIANCE: keeps appointments   PHYSICAL EXAM:  There were no vitals taken for this visit. Well-developed well-nourished patient in NAD. HEENT reveals PERLA, EOMI, discs not visualized.  Oral cavity is clear. No oral mucosal lesions are identified. Neck is clear without evidence of cervical or supraclavicular adenopathy. Lungs are clear to A&P. Cardiac examination is essentially unremarkable with regular rate and rhythm without murmur rub or thrill. Abdomen is benign with no organomegaly or masses noted. Motor sensory and DTR levels are equal and symmetric in the upper and lower extremities.  Cranial nerves II through XII are grossly intact. Proprioception is intact. No peripheral adenopathy or edema is identified. No motor or sensory levels are noted. Crude visual fields are within normal range.  RADIOLOGY RESULTS: No films for review  PLAN: Peripheral line was started on the patient and 20 cc of normal saline were passed to make sure there was patency of the lines. [name] was administered over a 5 minute infusion push by nuclear medicine technologist supervised by radiation oncologist. After completion of IV push of Xofigo 30 cc an additional saline were passed through the peripheral line. Oral lines syringes drapes and original container of Xofigo were then taken to nuclear medicine for storage. Patient tolerated the procedure well without side effect or complaint. Patient has anti-emetic medication. Have scheduled the patient for a three-week followup to check on his counts. Patient is to call sooner with any side effects or complaints.    Armstead Peaks., MD

## 2016-10-30 ENCOUNTER — Other Ambulatory Visit: Payer: Self-pay | Admitting: Oncology

## 2016-11-11 ENCOUNTER — Ambulatory Visit (INDEPENDENT_AMBULATORY_CARE_PROVIDER_SITE_OTHER): Payer: Medicare Other | Admitting: Urology

## 2016-11-11 DIAGNOSIS — Z192 Hormone resistant malignancy status: Secondary | ICD-10-CM

## 2016-11-11 DIAGNOSIS — C61 Malignant neoplasm of prostate: Secondary | ICD-10-CM

## 2016-11-11 DIAGNOSIS — C7951 Secondary malignant neoplasm of bone: Secondary | ICD-10-CM

## 2016-11-17 ENCOUNTER — Inpatient Hospital Stay: Payer: Medicare Other

## 2016-11-23 ENCOUNTER — Ambulatory Visit: Payer: Medicare Other

## 2016-11-23 ENCOUNTER — Ambulatory Visit: Payer: Medicare Other | Admitting: Radiation Oncology

## 2016-11-24 ENCOUNTER — Other Ambulatory Visit: Payer: Self-pay | Admitting: *Deleted

## 2016-11-24 ENCOUNTER — Inpatient Hospital Stay: Payer: Medicare Other | Attending: Radiation Oncology

## 2016-11-24 ENCOUNTER — Encounter: Payer: Self-pay | Admitting: *Deleted

## 2016-11-24 DIAGNOSIS — C61 Malignant neoplasm of prostate: Secondary | ICD-10-CM

## 2016-11-24 DIAGNOSIS — C7951 Secondary malignant neoplasm of bone: Secondary | ICD-10-CM

## 2016-11-24 LAB — CBC WITH DIFFERENTIAL/PLATELET
BASOS PCT: 0 %
Basophils Absolute: 0 10*3/uL (ref 0–0.1)
Eosinophils Absolute: 0.1 10*3/uL (ref 0–0.7)
Eosinophils Relative: 2 %
HEMATOCRIT: 36 % — AB (ref 40.0–52.0)
Hemoglobin: 12.2 g/dL — ABNORMAL LOW (ref 13.0–18.0)
LYMPHS ABS: 0.8 10*3/uL — AB (ref 1.0–3.6)
LYMPHS PCT: 13 %
MCH: 31.6 pg (ref 26.0–34.0)
MCHC: 34 g/dL (ref 32.0–36.0)
MCV: 93 fL (ref 80.0–100.0)
MONO ABS: 0.6 10*3/uL (ref 0.2–1.0)
MONOS PCT: 9 %
NEUTROS ABS: 4.9 10*3/uL (ref 1.4–6.5)
Neutrophils Relative %: 76 %
Platelets: 195 10*3/uL (ref 150–440)
RBC: 3.87 MIL/uL — ABNORMAL LOW (ref 4.40–5.90)
RDW: 14.2 % (ref 11.5–14.5)
WBC: 6.5 10*3/uL (ref 3.8–10.6)

## 2016-11-24 LAB — COMPREHENSIVE METABOLIC PANEL
ALT: 16 U/L — ABNORMAL LOW (ref 17–63)
AST: 24 U/L (ref 15–41)
Albumin: 4 g/dL (ref 3.5–5.0)
Alkaline Phosphatase: 95 U/L (ref 38–126)
Anion gap: 11 (ref 5–15)
BILIRUBIN TOTAL: 0.7 mg/dL (ref 0.3–1.2)
BUN: 17 mg/dL (ref 6–20)
CALCIUM: 9.2 mg/dL (ref 8.9–10.3)
CHLORIDE: 99 mmol/L — AB (ref 101–111)
CO2: 27 mmol/L (ref 22–32)
Creatinine, Ser: 0.84 mg/dL (ref 0.61–1.24)
Glucose, Bld: 136 mg/dL — ABNORMAL HIGH (ref 65–99)
POTASSIUM: 4.1 mmol/L (ref 3.5–5.1)
Sodium: 137 mmol/L (ref 135–145)
TOTAL PROTEIN: 7 g/dL (ref 6.5–8.1)

## 2016-11-24 LAB — PSA: Prostatic Specific Antigen: 186 ng/mL — ABNORMAL HIGH (ref 0.00–4.00)

## 2016-11-24 NOTE — Progress Notes (Signed)
Weight 102.00kg

## 2016-11-28 ENCOUNTER — Telehealth: Payer: Self-pay | Admitting: Oncology

## 2016-11-28 ENCOUNTER — Ambulatory Visit (HOSPITAL_BASED_OUTPATIENT_CLINIC_OR_DEPARTMENT_OTHER): Payer: Medicare Other | Admitting: Oncology

## 2016-11-28 VITALS — BP 149/95 | HR 78 | Temp 98.5°F | Resp 18 | Ht 71.0 in | Wt 225.2 lb

## 2016-11-28 DIAGNOSIS — C61 Malignant neoplasm of prostate: Secondary | ICD-10-CM | POA: Diagnosis not present

## 2016-11-28 NOTE — Telephone Encounter (Signed)
Scheduled appt per 10/15 los - Gave patient AVS and calender per los.  

## 2016-11-28 NOTE — Progress Notes (Signed)
Hematology and Oncology Follow Up Visit  [name] [name] [id number] [birth year] 76 y.o. 11/28/2016 9:52 AM [name], [name], [name], [name], MD   Principle Diagnosis: 76 year old gentleman with prostate cancer diagnosed in 2010. He had a Gleason score of 3+4 = 7 and a PSA of 4.4. His disease was in 2 cores and the biopsy. 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   Prior Therapy: He was treated with external beam radiation completed in October 2010. His PSA remain under reasonable control to about 2014 when his PSA rose to 165 and subsequently was up to 202. He is imaging studies including a CT scan of the abdomen and pelvis which showed 07/15/2013 to have a 3.0 x 2.1 cm retroperitoneal adenopathy. A left retrocrural lymph node was measuring 1.1 cm. A bone scan at that time showed a single focus of abnormal uptake in the left posterior fourth rib. No clear CT correlation which could suggest trauma.He was started on Firmagon by Dr. Jeffie Pollock in May 2015 and his PSA dropped to 18 in October 2015. His PSA in January 2016 was down to 17.48 and in April 2016 up again to 48.52. Testosterone level was at a castrate level of 16. Staging workup continue to show metastatic bony disease without any major visceral involvement. He developed castration resistant disease with a rising PSA up to 48. Xtandi 160 mg daily started in May 2016. Therapy discontinued in November 2017 because of progression of disease.  Current therapy:  Androgen deprivation under the care of Dr. Jeffie Pollock. Zytiga 1000 mg daily with prednisone started in December 2017.  Xofigo monthly basis started on 10/26/2016. This will be given monthly for a total of 6 months.  Interim History:  [name] presents today for a follow-up visit with his wife. Since his last visit, he reports no changes in his health. He continues to be active and attends to activities of daily living.He was able to travel and play golf for 2  consecutive days.  He received the first Xofigo infusion without complications. He continues to take Zytiga without any recent complications. He denied any excessive fatigue, weight loss but does report mild edema. He denied any hot flashes or any GI complications. He denied any bone pain or pathological fractures. He continues to enjoy excellent quality of life.   He does not report any headaches, blurry vision, double vision or alteration of mental status. He does not report any fevers, chills, sweats or weight loss. He does not report any chest pain, palpitation, orthopnea or PND. Does not report any cough or shortness of breath. He does not report any nausea, vomiting, abdominal pain, change in his bowel habits. He does not report any urgency, hesitancy. Does not report any hematuria or dysuria. Rest of his review of systems unremarkable.  Medications: I have reviewed the patient's current medications.  Current Outpatient Prescriptions  Medication Sig Dispense Refill  . ELIQUIS 5 MG TABS tablet TAKE 1 TABLET BY MOUTH TWO  TIMES DAILY 180 tablet 3  . hydrALAZINE (APRESOLINE) 50 MG tablet Take 1 tablet (50 mg total) by mouth 2 (two) times daily. 180 tablet 3  . latanoprost (XALATAN) 0.005 % ophthalmic solution Place 1 drop into both eyes at bedtime.     Marland Kitchen losartan (COZAAR) 50 MG tablet Take 50 mg by mouth daily.     . meclizine (ANTIVERT) 25 MG tablet Take 25 mg by mouth 3 (three) times daily as needed for dizziness.    Marland Kitchen  metFORMIN (GLUCOPHAGE) 500 MG tablet Take 500 mg by mouth daily with breakfast.     . metoprolol tartrate (LOPRESSOR) 100 MG tablet Take 1 tablet (100 mg total) by mouth 2 (two) times daily. 180 tablet 3  . predniSONE (DELTASONE) 5 MG tablet Take 1 tablet (5 mg total) by mouth daily with breakfast. 90 tablet 3  . simvastatin (ZOCOR) 20 MG tablet Take 20 mg by mouth daily at 6 PM.     . ZYTIGA 250 MG tablet TAKE 4 TABLETS (1,000MG ) BY MOUTH ONCE DAILY ON AN EMPTY STOMACH 1 HOUR  BEFORE OR 2 HOURS AFTER A MEAL 120 tablet 0   No current facility-administered medications for this visit.      Allergies: No Known Allergies  Past Medical History, Surgical history, Social history, and Family History were reviewed and updated.   Physical Exam: Blood pressure (!) 149/95, pulse 78, temperature 98.5 F (36.9 C), temperature source Oral, resp. rate 18, height 5\' 11"  (1.803 m), weight 225 lb 3.2 oz (102.2 kg), SpO2 98 %. ECOG: 1 General appearance: ell-appearing gentleman without distress. Head: Normocephalic, without obvious abnormality no oral ulcers or thrush. Neck: no adenopathy no thyromegaly. Lymph nodes: Cervical, supraclavicular, and axillary nodes normal. Heart:regular rate and rhythm, S1, S2 normal, no murmur, click, rub or gallop Lung:chest clear, no wheezing, rales, normal symmetric air entry no dullness to percussion. Abdomin: soft, non-tender, without masses or organomegaly no shifting dullness or ascites. EXT:no erythema, induration, or nodules   Lab Results: Lab Results  Component Value Date   WBC 6.5 11/24/2016   HGB 12.2 (L) 11/24/2016   HCT 36.0 (L) 11/24/2016   MCV 93.0 11/24/2016   PLT 195 11/24/2016     Chemistry      Component Value Date/Time   NA 137 11/24/2016 1046   NA 141 10/18/2016 0951   K 4.1 11/24/2016 1046   K 3.5 10/18/2016 0951   CL 99 (L) 11/24/2016 1046   CO2 27 11/24/2016 1046   CO2 29 10/18/2016 0951   BUN 17 11/24/2016 1046   BUN 16.6 10/18/2016 0951   CREATININE 0.84 11/24/2016 1046   CREATININE 1.0 10/18/2016 0951      Component Value Date/Time   CALCIUM 9.2 11/24/2016 1046   CALCIUM 9.6 10/18/2016 0951   ALKPHOS 95 11/24/2016 1046   ALKPHOS 107 10/18/2016 0951   AST 24 11/24/2016 1046   AST 22 10/18/2016 0951   ALT 16 (L) 11/24/2016 1046   ALT 17 10/18/2016 0951   BILITOT 0.7 11/24/2016 1046   BILITOT 0.86 10/18/2016 0951       Assessment and Plan:   76 year old gentleman with:  1. Prostate  cancer diagnosed in 2010. He had a Gleason score of 3+4 = 7 and a PSA of 4.4. His disease was in 2 cores and the biopsy. He received external beam radiation as a definitive modality. He developed recurrent disease with retroperitoneal adenopathy and a bony metastasis. His PSA was up to 202. He was treated with androgen deprivation and PSA was down to 18. He subsequently developed castration resistant disease with PSA up to 29.   He is status post Xtandi 160 mg daily started in May 2016 with therapy discontinued in December 2017 because progression of disease. PSA at that time was 28.0.  He is currently on Zytiga that was started in December 3151 without complications.  His CT scan and bone scan on 09/06/2016 showed progression of disease and bone only. His PSA continues to increase at this  time up to 82.  He continues to receive Xofigo without complications.   His PSA did show rise to 186 from 120 in September 2018. He understands that PSA response might not be detected on the current treatment and the plan is to complete 6 months of therapy. He will continue Zytiga as well.   2. Androgen depravation therapy: I recommended this to continue indefinitely.   3. Bone directed therapy: He will be a candidate for bone directed therapy after dental clearance. This will be addressed in future visits.  4. Atrial fibrillation: He is currently on Eliquis without bleeding complications.  5. Hypertension: His blood pressure ontinues to be close to normal range.  6. Hypokalemia: potassium continues to be within normal range.  7. Liver function surveillance: His function test continues to be within normal range.  8. Follow-up: Will be in 8 weeks follow on his progress.  Zola Button, MD 10/15/20189:52 AM

## 2016-11-30 ENCOUNTER — Encounter
Admission: RE | Admit: 2016-11-30 | Discharge: 2016-11-30 | Disposition: A | Discharge status: Home / Self Care | Payer: Medicare Other | Source: Ambulatory Visit | Attending: Radiation Oncology | Admitting: Radiation Oncology

## 2016-11-30 ENCOUNTER — Ambulatory Visit: Payer: Medicare Other | Admitting: Radiation Oncology

## 2016-11-30 DIAGNOSIS — C7951 Secondary malignant neoplasm of bone: Secondary | ICD-10-CM | POA: Diagnosis present

## 2016-11-30 DIAGNOSIS — C61 Malignant neoplasm of prostate: Secondary | ICD-10-CM

## 2016-11-30 MED ORDER — RADIUM RA 223 DICHLORIDE 30 MCCI/ML IV SOLN
153.0000 | Freq: Once | INTRAVENOUS | Status: AC
  Administered 2016-11-30: 153 via INTRAVENOUS

## 2016-12-01 NOTE — Progress Notes (Signed)
Radiation Oncology Follow up Note  Name: [name] [name]   Date:   11/30/2016 MRN:  [id number] DOB: 1940-11-11    This 76 y.o. male presents to the clinic today for secondXofigo effusion.  REFERRING PROVIDER: Noreene Filbert, MD  HPI: Patient is a 76 year old male with known. Hormone refractory stage IV prostate cancer seen today for his second infusion of Xofigo. Patient states he is able to play golf now multiple days of the week which was prohibitive based on pain prior. He is an excellent response from his first injection. No significant side effects or complaints.  COMPLICATIONS OF TREATMENT: none  FOLLOW UP COMPLIANCE: keeps appointments   PHYSICAL EXAM:  There were no vitals taken for this visit. Well-developed well-nourished patient in NAD. HEENT reveals PERLA, EOMI, discs not visualized.  Oral cavity is clear. No oral mucosal lesions are identified. Neck is clear without evidence of cervical or supraclavicular adenopathy. Lungs are clear to A&P. Cardiac examination is essentially unremarkable with regular rate and rhythm without murmur rub or thrill. Abdomen is benign with no organomegaly or masses noted. Motor sensory and DTR levels are equal and symmetric in the upper and lower extremities. Cranial nerves II through XII are grossly intact. Proprioception is intact. No peripheral adenopathy or edema is identified. No motor or sensory levels are noted. Crude visual fields are within normal range.  RADIOLOGY RESULTS: No current films for review  PLAN: Peripheral line was started on the patient and 20 cc of normal saline were passed to make sure there was patency of the lines. [name] was administered over a 5 minute infusion push by nuclear medicine technologist supervised by radiation oncologist. After completion of IV push of Xofigo 30 cc an additional saline were passed through the peripheral line. Oral lines syringes drapes and original container of Xofigo were then taken to  nuclear medicine for storage. Patient tolerated the procedure well without side effect or complaint. Patient has anti-emetic medication. Have scheduled the patient for a three-week followup to check on his counts. Patient is to call sooner with any side effects or complaints.    Armstead Peaks., MD

## 2016-12-03 ENCOUNTER — Other Ambulatory Visit: Payer: Self-pay | Admitting: Oncology

## 2016-12-15 ENCOUNTER — Inpatient Hospital Stay: Payer: Medicare Other

## 2016-12-21 ENCOUNTER — Ambulatory Visit: Payer: Medicare Other

## 2016-12-21 ENCOUNTER — Ambulatory Visit: Payer: Medicare Other | Admitting: Radiation Oncology

## 2016-12-22 ENCOUNTER — Encounter: Payer: Self-pay | Admitting: *Deleted

## 2016-12-22 ENCOUNTER — Ambulatory Visit: Payer: Medicare Other | Admitting: Radiation Oncology

## 2016-12-22 ENCOUNTER — Inpatient Hospital Stay: Payer: Medicare Other | Attending: Radiation Oncology

## 2016-12-22 DIAGNOSIS — C61 Malignant neoplasm of prostate: Secondary | ICD-10-CM | POA: Insufficient documentation

## 2016-12-22 DIAGNOSIS — C7951 Secondary malignant neoplasm of bone: Secondary | ICD-10-CM

## 2016-12-22 LAB — CBC WITH DIFFERENTIAL/PLATELET
BASOS ABS: 0 10*3/uL (ref 0–0.1)
BASOS PCT: 0 %
EOS ABS: 0.1 10*3/uL (ref 0–0.7)
EOS PCT: 1 %
HCT: 37.5 % — ABNORMAL LOW (ref 40.0–52.0)
Hemoglobin: 12.8 g/dL — ABNORMAL LOW (ref 13.0–18.0)
Lymphocytes Relative: 14 %
Lymphs Abs: 1 10*3/uL (ref 1.0–3.6)
MCH: 31.7 pg (ref 26.0–34.0)
MCHC: 34.1 g/dL (ref 32.0–36.0)
MCV: 92.8 fL (ref 80.0–100.0)
MONO ABS: 0.6 10*3/uL (ref 0.2–1.0)
Monocytes Relative: 8 %
Neutro Abs: 5.2 10*3/uL (ref 1.4–6.5)
Neutrophils Relative %: 77 %
PLATELETS: 185 10*3/uL (ref 150–440)
RBC: 4.04 MIL/uL — ABNORMAL LOW (ref 4.40–5.90)
RDW: 14 % (ref 11.5–14.5)
WBC: 6.9 10*3/uL (ref 3.8–10.6)

## 2016-12-28 ENCOUNTER — Ambulatory Visit: Payer: Medicare Other | Admitting: Radiation Oncology

## 2016-12-28 ENCOUNTER — Encounter
Admission: RE | Admit: 2016-12-28 | Discharge: 2016-12-28 | Disposition: A | Discharge status: Home / Self Care | Payer: Medicare Other | Source: Ambulatory Visit | Attending: Radiation Oncology | Admitting: Radiation Oncology

## 2016-12-28 DIAGNOSIS — C7951 Secondary malignant neoplasm of bone: Secondary | ICD-10-CM | POA: Diagnosis present

## 2016-12-28 DIAGNOSIS — C61 Malignant neoplasm of prostate: Secondary | ICD-10-CM | POA: Diagnosis present

## 2016-12-28 MED ORDER — RADIUM RA 223 DICHLORIDE 30 MCCI/ML IV SOLN
157.0000 | Freq: Once | INTRAVENOUS | Status: AC
  Administered 2016-12-28: 157 via INTRAVENOUS

## 2016-12-28 NOTE — Progress Notes (Signed)
Radiation Oncology Follow up Note  Name: [name] [name]   Date:   12/28/2016 MRN:  [id number] DOB: Jul 01, 1940    This 76 y.o. male presents to the clinic today for third injection ofXofigo.  REFERRING PROVIDER: Noreene Filbert, MD  HPI: Patient is a 76 year old male with known hormone refractory stage IV prostate cancer seen today for his third infusion of Xofigo. He is doing well. In no pain at this time continues to lose some weight and feels better than he has in years..  COMPLICATIONS OF TREATMENT: none  FOLLOW UP COMPLIANCE: keeps appointments   PHYSICAL EXAM:  There were no vitals taken for this visit. Well-developed well-nourished patient in NAD. HEENT reveals PERLA, EOMI, discs not visualized.  Oral cavity is clear. No oral mucosal lesions are identified. Neck is clear without evidence of cervical or supraclavicular adenopathy. Lungs are clear to A&P. Cardiac examination is essentially unremarkable with regular rate and rhythm without murmur rub or thrill. Abdomen is benign with no organomegaly or masses noted. Motor sensory and DTR levels are equal and symmetric in the upper and lower extremities. Cranial nerves II through XII are grossly intact. Proprioception is intact. No peripheral adenopathy or edema is identified. No motor or sensory levels are noted. Crude visual fields are within normal range.  RADIOLOGY RESULTS: No current films for review  PLAN: Peripheral line was started on the patient and 20 cc of normal saline were passed to make sure there was patency of the lines. [name] was administered over a 5 minute infusion push by nuclear medicine technologist supervised by radiation oncologist. After completion of IV push of Xofigo 30 cc an additional saline were passed through the peripheral line. Oral lines syringes drapes and original container of Xofigo were then taken to nuclear medicine for storage. Patient tolerated the procedure well without side effect or complaint.  Patient has anti-emetic medication. Have scheduled the patient for a three-week followup to check on his counts. Patient is to call sooner with any side effects or complaints.    Armstead Peaks., MD

## 2016-12-31 ENCOUNTER — Other Ambulatory Visit: Payer: Self-pay | Admitting: Oncology

## 2017-01-12 ENCOUNTER — Inpatient Hospital Stay: Payer: Medicare Other

## 2017-01-18 ENCOUNTER — Ambulatory Visit: Payer: Medicare Other

## 2017-01-18 ENCOUNTER — Ambulatory Visit: Payer: Medicare Other | Admitting: Radiation Oncology

## 2017-01-19 ENCOUNTER — Other Ambulatory Visit: Payer: Self-pay | Admitting: *Deleted

## 2017-01-19 ENCOUNTER — Encounter: Payer: Self-pay | Admitting: *Deleted

## 2017-01-19 ENCOUNTER — Inpatient Hospital Stay: Payer: Medicare Other | Attending: Radiation Oncology | Admitting: *Deleted

## 2017-01-19 DIAGNOSIS — C61 Malignant neoplasm of prostate: Secondary | ICD-10-CM

## 2017-01-19 DIAGNOSIS — C7951 Secondary malignant neoplasm of bone: Secondary | ICD-10-CM

## 2017-01-19 LAB — CBC WITH DIFFERENTIAL/PLATELET
BASOS ABS: 0 10*3/uL (ref 0–0.1)
Basophils Relative: 0 %
EOS PCT: 1 %
Eosinophils Absolute: 0 10*3/uL (ref 0–0.7)
HCT: 35.8 % — ABNORMAL LOW (ref 40.0–52.0)
Hemoglobin: 12.2 g/dL — ABNORMAL LOW (ref 13.0–18.0)
LYMPHS PCT: 15 %
Lymphs Abs: 0.8 10*3/uL — ABNORMAL LOW (ref 1.0–3.6)
MCH: 31.5 pg (ref 26.0–34.0)
MCHC: 34 g/dL (ref 32.0–36.0)
MCV: 92.8 fL (ref 80.0–100.0)
Monocytes Absolute: 0.5 10*3/uL (ref 0.2–1.0)
Monocytes Relative: 9 %
NEUTROS ABS: 4.1 10*3/uL (ref 1.4–6.5)
NEUTROS PCT: 75 %
PLATELETS: 173 10*3/uL (ref 150–440)
RBC: 3.86 MIL/uL — AB (ref 4.40–5.90)
RDW: 14 % (ref 11.5–14.5)
WBC: 5.5 10*3/uL (ref 3.8–10.6)

## 2017-01-19 LAB — PSA: PROSTATIC SPECIFIC ANTIGEN: 229 ng/mL — AB (ref 0.00–4.00)

## 2017-01-24 ENCOUNTER — Telehealth: Payer: Self-pay | Admitting: Oncology

## 2017-01-24 ENCOUNTER — Ambulatory Visit (HOSPITAL_BASED_OUTPATIENT_CLINIC_OR_DEPARTMENT_OTHER): Payer: Medicare Other | Admitting: Oncology

## 2017-01-24 VITALS — BP 147/95 | HR 89 | Temp 98.3°F | Resp 18 | Ht 71.0 in | Wt 214.3 lb

## 2017-01-24 DIAGNOSIS — I4891 Unspecified atrial fibrillation: Secondary | ICD-10-CM

## 2017-01-24 DIAGNOSIS — I10 Essential (primary) hypertension: Secondary | ICD-10-CM

## 2017-01-24 DIAGNOSIS — C772 Secondary and unspecified malignant neoplasm of intra-abdominal lymph nodes: Secondary | ICD-10-CM | POA: Diagnosis not present

## 2017-01-24 DIAGNOSIS — C61 Malignant neoplasm of prostate: Secondary | ICD-10-CM

## 2017-01-24 DIAGNOSIS — R609 Edema, unspecified: Secondary | ICD-10-CM

## 2017-01-24 DIAGNOSIS — E291 Testicular hypofunction: Secondary | ICD-10-CM

## 2017-01-24 DIAGNOSIS — C7951 Secondary malignant neoplasm of bone: Secondary | ICD-10-CM | POA: Diagnosis not present

## 2017-01-24 NOTE — Progress Notes (Signed)
Hematology and Oncology Follow Up Visit  [name] [name] [id number] May 12, 1940 76 y.o. 01/24/2017 10:52 AM [name], [name] [name], [name], [name], MD   Principle Diagnosis: 76 year old gentleman with prostate cancer diagnosed in 2010. He had a Gleason score of 3+4 = 7 and a PSA of 4.4. His disease was in 2 cores and the biopsy. 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   Prior Therapy: He was treated with external beam radiation completed in October 2010. His PSA remain under reasonable control to about 2014 when his PSA rose to 165 and subsequently was up to 202. He is imaging studies including a CT scan of the abdomen and pelvis which showed 07/15/2013 to have a 3.0 x 2.1 cm retroperitoneal adenopathy. A left retrocrural lymph node was measuring 1.1 cm. A bone scan at that time showed a single focus of abnormal uptake in the left posterior fourth rib. No clear CT correlation which could suggest trauma.He was started on Firmagon by Dr. Jeffie Pollock in May 2015 and his PSA dropped to 18 in October 2015. His PSA in January 2016 was down to 17.48 and in April 2016 up again to 48.52. Testosterone level was at a castrate level of 16. Staging workup continue to show metastatic bony disease without any major visceral involvement. He developed castration resistant disease with a rising PSA up to 48. Xtandi 160 mg daily started in May 2016. Therapy discontinued in November 2017 because of progression of disease.  Current therapy:  Androgen deprivation under the care of Dr. Jeffie Pollock. Zytiga 1000 mg daily with prednisone started in December 2017.  Xofigo monthly basis started on 10/26/2016. This will be given monthly for a total of 6 months.  Interim History:  Mr. [name] [name] presents today for a follow-up visit with his wife. Since his last visit, he reports doing well without any complaints.  He continues to watch his diet and exercise periodically and intentionally lost weight and feels  healthier. He continues to receive Xofigo infusion without complications. He denied any excessive fatigue, weight loss but does report mild edema. He denied any hot flashes or any GI complications. He denied any bone pain or pathological fractures.  His performance status and activity level remained excellent.   He does not report any headaches, blurry vision, double vision or alteration of mental status. He does not report any fevers, chills, sweats or weight loss. He does not report any chest pain, palpitation, orthopnea or PND. Does not report any cough or shortness of breath. He does not report any nausea, vomiting, abdominal pain, change in his bowel habits. He does not report any urgency, hesitancy. Does not report any hematuria or dysuria. Rest of his review of systems unremarkable.  Medications: I have reviewed the patient's current medications.  Current Outpatient Medications  Medication Sig Dispense Refill  . ELIQUIS 5 MG TABS tablet TAKE 1 TABLET BY MOUTH TWO  TIMES DAILY 180 tablet 3  . hydrALAZINE (APRESOLINE) 50 MG tablet Take 1 tablet (50 mg total) by mouth 2 (two) times daily. 180 tablet 3  . latanoprost (XALATAN) 0.005 % ophthalmic solution Place 1 drop into both eyes at bedtime.     Marland Kitchen losartan (COZAAR) 50 MG tablet Take 50 mg by mouth daily.     . meclizine (ANTIVERT) 25 MG tablet Take 25 mg by mouth 3 (three) times daily as needed for dizziness.    . metFORMIN (GLUCOPHAGE) 500 MG tablet Take 500 mg by mouth daily with breakfast.     .  metoprolol tartrate (LOPRESSOR) 100 MG tablet Take 1 tablet (100 mg total) by mouth 2 (two) times daily. 180 tablet 3  . predniSONE (DELTASONE) 5 MG tablet Take 1 tablet (5 mg total) by mouth daily with breakfast. 90 tablet 3  . simvastatin (ZOCOR) 20 MG tablet Take 20 mg by mouth daily at 6 PM.     . ZYTIGA 250 MG tablet TAKE 4 TABLETS (1,000MG ) BY MOUTH ONCE DAILY ON AN EMPTY STOMACH 1 HOUR BEFORE OR 2 HOURS AFTER A MEAL 120 tablet 0   No  current facility-administered medications for this visit.      Allergies: No Known Allergies  Past Medical History, Surgical history, Social history, and Family History were reviewed and updated.   Physical Exam: Blood pressure (!) 147/95, pulse 89, temperature 98.3 F (36.8 C), temperature source Oral, resp. rate 18, height 5\' 11"  (1.803 m), weight 214 lb 4.8 oz (97.2 kg), SpO2 97 %. ECOG: 1 General appearance: Alert, awake gentleman without distress. Head: Normocephalic, without obvious abnormality no oral thrush or ulcers. Neck: no adenopathy no neck masses. Lymph nodes: Cervical, supraclavicular, and axillary nodes normal. Heart:regular rate and rhythm, S1, S2 normal, no murmur, click, rub or gallop Lung:chest clear, no wheezing, rales, normal symmetric air entry no dullness to percussion. Abdomin: soft, non-tender, without masses or organomegaly no rebound or guarding. EXT:no erythema, induration, or nodules   Lab Results: Lab Results  Component Value Date   WBC 5.5 01/19/2017   HGB 12.2 (L) 01/19/2017   HCT 35.8 (L) 01/19/2017   MCV 92.8 01/19/2017   PLT 173 01/19/2017     Chemistry      Component Value Date/Time   NA 137 11/24/2016 1046   NA 141 10/18/2016 0951   K 4.1 11/24/2016 1046   K 3.5 10/18/2016 0951   CL 99 (L) 11/24/2016 1046   CO2 27 11/24/2016 1046   CO2 29 10/18/2016 0951   BUN 17 11/24/2016 1046   BUN 16.6 10/18/2016 0951   CREATININE 0.84 11/24/2016 1046   CREATININE 1.0 10/18/2016 0951      Component Value Date/Time   CALCIUM 9.2 11/24/2016 1046   CALCIUM 9.6 10/18/2016 0951   ALKPHOS 95 11/24/2016 1046   ALKPHOS 107 10/18/2016 0951   AST 24 11/24/2016 1046   AST 22 10/18/2016 0951   ALT 16 (L) 11/24/2016 1046   ALT 17 10/18/2016 0951   BILITOT 0.7 11/24/2016 1046   BILITOT 0.86 10/18/2016 0951     Results for [name], [name] (MRN [id number]) as of 01/24/2017 10:31  Ref. Range 11/24/2016 10:46 01/19/2017 11:03  Prostatic Specific  Antigen Latest Ref Range: 0.00 - 4.00 ng/mL 186.00 (H) 229.00 (H)    Assessment and Plan:   76 year old gentleman with:  1. Prostate cancer diagnosed in 2010. He had a Gleason score of 3+4 = 7 and a PSA of 4.4. His disease was in 2 cores and the biopsy. He received external beam radiation as a definitive modality. He developed recurrent disease with retroperitoneal adenopathy and a bony metastasis. His PSA was up to 202. He was treated with androgen deprivation and PSA was down to 18. He subsequently developed castration resistant disease with PSA up to 49.   He is status post Xtandi 160 mg daily started in May 2016 with therapy discontinued in December 2017 because progression of disease. PSA at that time was 28.0.  He is currently on Zytiga that was started in December 0100 without complications.  His CT scan and bone  scan on 09/06/2016 showed progression of disease and bone only. His PSA continues to increase at this time up to 82.  He continues to receive Xofigo without complications.   The plan is to complete 6 treatments of Xofigo before making any changes on his prostate cancer regimen.   2. Androgen depravation therapy: I recommended this to continue indefinitely.   3. Bone directed therapy: He will be a candidate for bone directed therapy after dental clearance. This will be addressed in future visits.  4. Atrial fibrillation: He is currently on Eliquis without bleeding complications.  5. Hypertension: His blood pressure remains reasonably controlled.  6. Hypokalemia: potassium continues to be within normal range.  7. Liver function surveillance: His function test continues to be within normal range.  8. Follow-up: Will be in 8 weeks follow on his progress.  Zola Button, MD 12/11/201810:52 AM

## 2017-01-24 NOTE — Telephone Encounter (Signed)
Scheduled appt per 12/11 los - Gave patient AVS and calender per los.  

## 2017-01-25 ENCOUNTER — Ambulatory Visit: Payer: Medicare Other | Admitting: Radiation Oncology

## 2017-01-25 ENCOUNTER — Ambulatory Visit
Admission: RE | Admit: 2017-01-25 | Discharge: 2017-01-25 | Disposition: A | Discharge status: Home / Self Care | Payer: Medicare Other | Source: Ambulatory Visit | Attending: Radiation Oncology | Admitting: Radiation Oncology

## 2017-01-25 DIAGNOSIS — C61 Malignant neoplasm of prostate: Secondary | ICD-10-CM | POA: Insufficient documentation

## 2017-01-25 DIAGNOSIS — C7951 Secondary malignant neoplasm of bone: Secondary | ICD-10-CM | POA: Insufficient documentation

## 2017-01-25 MED ORDER — RADIUM RA 223 DICHLORIDE 30 MCCI/ML IV SOLN
151.0000 | Freq: Once | INTRAVENOUS | Status: AC
  Administered 2017-01-25: 151 via INTRAVENOUS

## 2017-01-30 ENCOUNTER — Other Ambulatory Visit: Payer: Self-pay | Admitting: Oncology

## 2017-02-02 ENCOUNTER — Encounter: Payer: Self-pay | Admitting: *Deleted

## 2017-02-09 ENCOUNTER — Inpatient Hospital Stay: Payer: Medicare Other

## 2017-02-15 ENCOUNTER — Ambulatory Visit: Payer: Medicare Other | Admitting: Radiation Oncology

## 2017-02-16 ENCOUNTER — Encounter: Payer: Self-pay | Admitting: *Deleted

## 2017-02-16 ENCOUNTER — Inpatient Hospital Stay: Payer: Medicare Other | Attending: Radiation Oncology

## 2017-02-16 DIAGNOSIS — C7951 Secondary malignant neoplasm of bone: Secondary | ICD-10-CM | POA: Diagnosis not present

## 2017-02-16 DIAGNOSIS — C61 Malignant neoplasm of prostate: Secondary | ICD-10-CM | POA: Diagnosis not present

## 2017-02-16 LAB — CBC WITH DIFFERENTIAL/PLATELET
BASOS PCT: 0 %
Basophils Absolute: 0 10*3/uL (ref 0–0.1)
Eosinophils Absolute: 0 10*3/uL (ref 0–0.7)
Eosinophils Relative: 1 %
HEMATOCRIT: 35.3 % — AB (ref 40.0–52.0)
HEMOGLOBIN: 11.9 g/dL — AB (ref 13.0–18.0)
LYMPHS ABS: 0.8 10*3/uL — AB (ref 1.0–3.6)
Lymphocytes Relative: 14 %
MCH: 31.9 pg (ref 26.0–34.0)
MCHC: 33.8 g/dL (ref 32.0–36.0)
MCV: 94.4 fL (ref 80.0–100.0)
MONOS PCT: 9 %
Monocytes Absolute: 0.5 10*3/uL (ref 0.2–1.0)
NEUTROS ABS: 4.4 10*3/uL (ref 1.4–6.5)
NEUTROS PCT: 76 %
Platelets: 171 10*3/uL (ref 150–440)
RBC: 3.73 MIL/uL — AB (ref 4.40–5.90)
RDW: 14.2 % (ref 11.5–14.5)
WBC: 5.8 10*3/uL (ref 3.8–10.6)

## 2017-02-22 ENCOUNTER — Ambulatory Visit: Payer: Medicare Other | Admitting: Radiation Oncology

## 2017-02-22 ENCOUNTER — Ambulatory Visit
Admission: RE | Admit: 2017-02-22 | Discharge: 2017-02-22 | Disposition: A | Discharge status: Home / Self Care | Payer: Medicare Other | Source: Ambulatory Visit | Attending: Radiation Oncology | Admitting: Radiation Oncology

## 2017-02-22 DIAGNOSIS — C61 Malignant neoplasm of prostate: Secondary | ICD-10-CM | POA: Diagnosis not present

## 2017-02-22 DIAGNOSIS — C7951 Secondary malignant neoplasm of bone: Secondary | ICD-10-CM | POA: Diagnosis present

## 2017-02-22 MED ORDER — RADIUM RA 223 DICHLORIDE 30 MCCI/ML IV SOLN
148.4000 | Freq: Once | INTRAVENOUS | Status: AC
  Administered 2017-02-22: 148.4 via INTRAVENOUS

## 2017-02-22 NOTE — Progress Notes (Signed)
Radiation Oncology Follow up Note Xofigo infusion note  Name: [name] [name]   Date:   02/22/2017 MRN:  [id number] DOB: 09-27-1940    This 77 y.o. male presents to the clinic today for fifth Xofigo injection for castrate resistant stage IV prostate cancer.  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 He episode last week of what sounds like diverticulitis seen in the emergency room and treated with antibiotic therapy with relief. He's having no bone bone pain at this time. No other complaints..  COMPLICATIONS OF TREATMENT: none  FOLLOW UP COMPLIANCE: keeps appointments   PHYSICAL EXAM:  There were no vitals taken for this visit. Well-developed well-nourished patient in NAD. HEENT reveals PERLA, EOMI, discs not visualized.  Oral cavity is clear. No oral mucosal lesions are identified. Neck is clear without evidence of cervical or supraclavicular adenopathy. Lungs are clear to A&P. Cardiac examination is essentially unremarkable with regular rate and rhythm without murmur rub or thrill. Abdomen is benign with no organomegaly or masses noted. Motor sensory and DTR levels are equal and symmetric in the upper and lower extremities. Cranial nerves II through XII are grossly intact. Proprioception is intact. No peripheral adenopathy or edema is identified. No motor or sensory levels are noted. Crude visual fields are within normal range.  RADIOLOGY RESULTS: No current films for review  PLAN: Peripheral line was started on the patient and 20 cc of normal saline were passed to make sure there was patency of the lines. [name] was administered over a 5 minute infusion push by nuclear medicine technologist supervised by radiation oncologist. After completion of IV push of Xofigo 30 cc an additional saline were passed through the peripheral line. Oral lines syringes drapes  and original container of Xofigo were then taken to nuclear medicine for storage. Patient tolerated the procedure well without side effect or complaint. Patient has anti-emetic medication. Have scheduled the patient for a three-week followup to check on his counts. Patient is to call sooner with any side effects or complaints.    Noreene Filbert, MD

## 2017-02-23 ENCOUNTER — Other Ambulatory Visit: Payer: Self-pay | Admitting: Oncology

## 2017-03-09 ENCOUNTER — Inpatient Hospital Stay: Payer: Medicare Other

## 2017-03-15 ENCOUNTER — Ambulatory Visit: Payer: Medicare Other | Admitting: Radiation Oncology

## 2017-03-16 ENCOUNTER — Inpatient Hospital Stay: Payer: Medicare Other

## 2017-03-16 ENCOUNTER — Encounter: Payer: Self-pay | Admitting: *Deleted

## 2017-03-16 DIAGNOSIS — C61 Malignant neoplasm of prostate: Secondary | ICD-10-CM

## 2017-03-16 DIAGNOSIS — C7951 Secondary malignant neoplasm of bone: Secondary | ICD-10-CM

## 2017-03-16 LAB — CBC WITH DIFFERENTIAL/PLATELET
BASOS ABS: 0 10*3/uL (ref 0–0.1)
Basophils Relative: 1 %
EOS ABS: 0 10*3/uL (ref 0–0.7)
EOS PCT: 1 %
HCT: 34.2 % — ABNORMAL LOW (ref 40.0–52.0)
Hemoglobin: 11.5 g/dL — ABNORMAL LOW (ref 13.0–18.0)
LYMPHS PCT: 11 %
Lymphs Abs: 0.6 10*3/uL — ABNORMAL LOW (ref 1.0–3.6)
MCH: 31.9 pg (ref 26.0–34.0)
MCHC: 33.7 g/dL (ref 32.0–36.0)
MCV: 94.8 fL (ref 80.0–100.0)
Monocytes Absolute: 0.5 10*3/uL (ref 0.2–1.0)
Monocytes Relative: 10 %
NEUTROS PCT: 77 %
Neutro Abs: 4.1 10*3/uL (ref 1.4–6.5)
PLATELETS: 155 10*3/uL (ref 150–440)
RBC: 3.61 MIL/uL — ABNORMAL LOW (ref 4.40–5.90)
RDW: 14.1 % (ref 11.5–14.5)
WBC: 5.3 10*3/uL (ref 3.8–10.6)

## 2017-03-16 LAB — PSA: PROSTATIC SPECIFIC ANTIGEN: 289 ng/mL — AB (ref 0.00–4.00)

## 2017-03-21 ENCOUNTER — Telehealth: Payer: Self-pay | Admitting: Oncology

## 2017-03-21 ENCOUNTER — Inpatient Hospital Stay: Payer: Medicare Other | Attending: Oncology | Admitting: Oncology

## 2017-03-21 VITALS — BP 140/94 | HR 96 | Temp 98.1°F | Resp 18 | Ht 71.0 in | Wt 206.2 lb

## 2017-03-21 DIAGNOSIS — I10 Essential (primary) hypertension: Secondary | ICD-10-CM

## 2017-03-21 DIAGNOSIS — E876 Hypokalemia: Secondary | ICD-10-CM | POA: Diagnosis not present

## 2017-03-21 DIAGNOSIS — C61 Malignant neoplasm of prostate: Secondary | ICD-10-CM | POA: Diagnosis not present

## 2017-03-21 DIAGNOSIS — C7951 Secondary malignant neoplasm of bone: Secondary | ICD-10-CM | POA: Diagnosis not present

## 2017-03-21 DIAGNOSIS — Z79899 Other long term (current) drug therapy: Secondary | ICD-10-CM | POA: Diagnosis not present

## 2017-03-21 DIAGNOSIS — R599 Enlarged lymph nodes, unspecified: Secondary | ICD-10-CM | POA: Diagnosis not present

## 2017-03-21 DIAGNOSIS — I4891 Unspecified atrial fibrillation: Secondary | ICD-10-CM

## 2017-03-21 DIAGNOSIS — Z7984 Long term (current) use of oral hypoglycemic drugs: Secondary | ICD-10-CM | POA: Diagnosis not present

## 2017-03-21 DIAGNOSIS — Z7901 Long term (current) use of anticoagulants: Secondary | ICD-10-CM | POA: Diagnosis not present

## 2017-03-21 DIAGNOSIS — Z923 Personal history of irradiation: Secondary | ICD-10-CM

## 2017-03-21 NOTE — Progress Notes (Signed)
Hematology and Oncology Follow Up Visit  [name] [name] [id number] 12/12/1940 77 y.o. 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  He was initially diagnosed in 2010 with Gleason score of 3+4 = 7 and a PSA of 4.4.   Prior Therapy: He was treated with external beam radiation completed in October 2010.   He was started on Firmagon by Dr. Jeffie Pollock in May 2015 and his PSA dropped to 18 in October 2015 after he developed recurrent disease with lymphadenopathy.   He developed castration resistant disease with a rising PSA up to 48. Xtandi 160 mg daily started in May 2016. Therapy discontinued in November 2017 because of progression of disease.  Current therapy:  Androgen deprivation under the care of Dr. Jeffie Pollock. Zytiga 1000 mg daily with prednisone started in December 2017.  Xofigo monthly basis started on 10/26/2016.  He completed 5 months of therapy.  Interim History:  [name] [name] is here for a follow-up with his wife.  He continues to receive Xofigo without any complications.  He completed 5 cycles and scheduled for his 6 cycle tomorrow.  He denied any infusion related issues including nausea, fatigue or pain.  He reports no arthralgias, myalgias or excessive bone pain.  He continues to attend to activities of daily living without any decline.  His appetite remain excellent although he is losing weight intentionally.  He has cut down on certain foods and has been exercising regularly.  He continues to take Zytiga as well without any new complications.  He denies any edema or excessive fatigue.   He does not report any headaches, blurry vision, double vision or seizures.  He does not report any fevers, chills, sweats or weight loss. He does not report any chest pain, palpitation, orthopnea or PND. Does not report any cough or shortness of breath. He does not report any  nausea, vomiting, abdominal pain, change in his bowel habits. He does not report any urgency, hesitancy. Does not report any hematuria or dysuria.  He does not report any arthralgias or myalgias.  He does not report any heat or cold intolerance.  He does not report any skin rashes or lesions.  He does not report any lymphadenopathy or petechiae.  He does not report any anxiety or depression.  Rest of his review of systems is negative.   Medications: I have reviewed the patient's current medications.  Current Outpatient Medications  Medication Sig Dispense Refill  . abiraterone acetate (ZYTIGA) 250 MG tablet TAKE 4 TABLETS (1,000MG) BY MOUTH ONCE DAILY ON AN EMPTY STOMACH 1 HOUR BEFORE OR 2 HOURS AFTER A MEAL 120 tablet 0  . ELIQUIS 5 MG TABS tablet TAKE 1 TABLET BY MOUTH TWO  TIMES DAILY 180 tablet 3  . hydrALAZINE (APRESOLINE) 50 MG tablet Take 1 tablet (50 mg total) by mouth 2 (two) times daily. 180 tablet 3  . latanoprost (XALATAN) 0.005 % ophthalmic solution Place 1 drop into both eyes at bedtime.     Marland Kitchen losartan (COZAAR) 50 MG tablet Take 50 mg by mouth daily.     . meclizine (ANTIVERT) 25 MG tablet Take 25 mg by mouth 3 (three) times daily as needed for dizziness.    . metFORMIN (GLUCOPHAGE) 500 MG tablet Take 500 mg by mouth daily with breakfast.     . metoprolol tartrate (LOPRESSOR) 100 MG tablet Take 1 tablet (100 mg total) by mouth 2 (  two) times daily. 180 tablet 3  . predniSONE (DELTASONE) 5 MG tablet Take 1 tablet (5 mg total) by mouth daily with breakfast. 90 tablet 3  . simvastatin (ZOCOR) 20 MG tablet Take 20 mg by mouth daily at 6 PM.      No current facility-administered medications for this visit.      Allergies: No Known Allergies  Past Medical History, Surgical history, Social history, and Family History reviewed personally and unchanged.  Physical Exam: Blood pressure (!) 140/94, pulse 96, temperature 98.1 F (36.7 C), temperature source Oral, resp. rate 18, height 5'  11" (1.803 m), weight 206 lb 3.2 oz (93.5 kg), SpO2 100 %. ECOG: 1 General appearance: Well-appearing gentleman appeared comfortable. Head: Normocephalic, without obvious abnormality  Oral mucosa: No oral thrush or ulcers. Eyes: No scleral icterus.  Pupils are equal and round and reactive. Lymph nodes: Cervical, supraclavicular, and axillary nodes normal. Heart:regular rate and rhythm, S1, S2 normal, no murmur, click, rub or gallop Lung: Clear to auscultation in all lung fields.  No rhonchi or wheezes. Abdomin: Soft with good bowel sounds.  No rebound or guarding.  No shifting dullness. Skin: No rashes, lesions or ecchymosis. Musculoskeletal: No joint deformity or effusion. Neurological: No deficits including motor, sensory or deep tendon reflexes.   Lab Results: Lab Results  Component Value Date   WBC 5.3 03/16/2017   HGB 11.5 (L) 03/16/2017   HCT 34.2 (L) 03/16/2017   MCV 94.8 03/16/2017   PLT 155 03/16/2017     Chemistry      Component Value Date/Time   NA 137 11/24/2016 1046   NA 141 10/18/2016 0951   K 4.1 11/24/2016 1046   K 3.5 10/18/2016 0951   CL 99 (L) 11/24/2016 1046   CO2 27 11/24/2016 1046   CO2 29 10/18/2016 0951   BUN 17 11/24/2016 1046   BUN 16.6 10/18/2016 0951   CREATININE 0.84 11/24/2016 1046   CREATININE 1.0 10/18/2016 0951      Component Value Date/Time   CALCIUM 9.2 11/24/2016 1046   CALCIUM 9.6 10/18/2016 0951   ALKPHOS 95 11/24/2016 1046   ALKPHOS 107 10/18/2016 0951   AST 24 11/24/2016 1046   AST 22 10/18/2016 0951   ALT 16 (L) 11/24/2016 1046   ALT 17 10/18/2016 0951   BILITOT 0.7 11/24/2016 1046   BILITOT 0.86 10/18/2016 0951     Results for [name], [name] (MRN [id number]) as of 03/21/2017 10:26  Ref. Range 11/24/2016 10:46 01/19/2017 11:03 03/16/2017 11:18  Prostatic Specific Antigen Latest Ref Range: 0.00 - 4.00 ng/mL 186.00 (H) 229.00 (H) 289.00 (H)     Assessment and Plan:   77 year old gentleman with:  1.  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  He was initially diagnosed in 2010. He had a Gleason score of 3+4 = 7 and a PSA of 4.4.   He is status post androgen deprivation therapy followed by treatment for castration resistant disease with Xtandi.  He is currently receiving Zytiga as well as Xofigo.  He completed 5 cycles of Xofigo and scheduled for 6 infusion on 03/22/2017.  The natural course of his disease was discussed again today in detail.  His PSA continues to rise as he is receiving [name] which is not unexpected.  He understands that his disease will likely require different therapy than Zytiga upon completion of Xofigo.  These options were reviewed again today in detail.  These options would include systemic chemotherapy versus obtaining  tissue biopsy for possible actionable mutation with oral targeted therapy.  Checking for BRCA mutation would be reasonable and attempt to treat him with a PARP inhibitor.  The plan is to obtain imaging studies in March 2019 and discussed whether to proceed with chemotherapy or obtaining tissue biopsy for next generation sequencing and possible targeted therapy.   2. Androgen depravation therapy: Risks and benefits of this therapy was discussed today.  Long-term treatment complications were reviewed and is agreeable to continue that long-term under the care of Dr. Jeffie Pollock.  3. Bone directed therapy: Repeat bone scan will assess his bone disease.  He will be a good candidate for Xgeva after obtaining dental clearance at the time.  4. Atrial fibrillation: He is currently on Eliquis without bleeding complications.  No recent exacerbations noted.  5. Hypertension: His blood pressure has not been affected with Zytiga as of late.  Blood pressure is within normal range.  6. Hypokalemia: Has been checked periodically without any major changes.  7. Liver function surveillance: No changes noted on his last liver function test  in October 2018.  8. Follow-up: Will be in 6 weeks follow on his progress.  25  minutes was spent with the patient face-to-face today.  More than 50% of time was dedicated to patient counseling, education and coordination of his multifaceted care.   Zola Button, MD 2/5/201910:42 AM

## 2017-03-21 NOTE — Telephone Encounter (Signed)
Scheduled appt per 2/5 los - Gave patient AVS and calender per los. - Central radiology to contact patient with CT and Bone scan apt.

## 2017-03-22 ENCOUNTER — Inpatient Hospital Stay: Admission: RE | Admit: 2017-03-22 | Payer: Medicare Other | Source: Ambulatory Visit

## 2017-03-22 ENCOUNTER — Ambulatory Visit: Payer: Medicare Other | Admitting: Radiation Oncology

## 2017-03-27 ENCOUNTER — Other Ambulatory Visit: Payer: Self-pay | Admitting: Oncology

## 2017-03-29 ENCOUNTER — Other Ambulatory Visit: Payer: Self-pay | Admitting: *Deleted

## 2017-03-29 DIAGNOSIS — C61 Malignant neoplasm of prostate: Secondary | ICD-10-CM

## 2017-03-30 ENCOUNTER — Encounter: Payer: Self-pay | Admitting: *Deleted

## 2017-03-30 ENCOUNTER — Inpatient Hospital Stay: Payer: Medicare Other | Attending: Radiation Oncology

## 2017-03-30 DIAGNOSIS — C61 Malignant neoplasm of prostate: Secondary | ICD-10-CM | POA: Insufficient documentation

## 2017-03-30 LAB — CBC WITH DIFFERENTIAL/PLATELET
BASOS PCT: 0 %
Basophils Absolute: 0 10*3/uL (ref 0–0.1)
EOS ABS: 0 10*3/uL (ref 0–0.7)
Eosinophils Relative: 1 %
HCT: 33 % — ABNORMAL LOW (ref 40.0–52.0)
HEMOGLOBIN: 11.4 g/dL — AB (ref 13.0–18.0)
Lymphocytes Relative: 12 %
Lymphs Abs: 0.7 10*3/uL — ABNORMAL LOW (ref 1.0–3.6)
MCH: 32.3 pg (ref 26.0–34.0)
MCHC: 34.6 g/dL (ref 32.0–36.0)
MCV: 93.5 fL (ref 80.0–100.0)
MONOS PCT: 8 %
Monocytes Absolute: 0.5 10*3/uL (ref 0.2–1.0)
NEUTROS PCT: 79 %
Neutro Abs: 4.4 10*3/uL (ref 1.4–6.5)
Platelets: 183 10*3/uL (ref 150–440)
RBC: 3.53 MIL/uL — AB (ref 4.40–5.90)
RDW: 13.8 % (ref 11.5–14.5)
WBC: 5.6 10*3/uL (ref 3.8–10.6)

## 2017-04-04 ENCOUNTER — Telehealth: Payer: Self-pay | Admitting: Cardiovascular Disease

## 2017-04-04 NOTE — Telephone Encounter (Signed)
Pt c/o medication issue:  1. Name of Medication:  ELIQUIS 5 MG TABS tablet 2. How are you currently taking this medication (dosage and times per day)? TAKE 1 TABLET BY MOUTH TWO TIMES DAILY 3. Are you having a reaction (difficulty breathing--STAT)? no  4. What is your medication issue? No issues pt don't want to be off the medication too long he has A-Fib and he had surgery   03/24/2017 hernia repair

## 2017-04-04 NOTE — Telephone Encounter (Signed)
Patient stopped eliquis on 2/6. Had hernia surgery on 2/8 by Dr. Costella Hatcher [phone]. On Sun 2/10 went to ER bleeding from incision.  Saw his surgeon at that ER visit and was instructed to remain off eliquis.  Bleeding has decreased significantly. According to patient has changed dressing once in 24 hr.  Saw PCP today who voiced concern about patient still off eliquis. Had appointment this coming thurs with surgeon but had to move it to next Tue 3/40 due to conflict with his cancer treatment. I adv that typically the surgeon determines when to restart anticoagulation.  Pt states he is in afib all the time, never had a stroke. Wants to let Dr. Acie Fredrickson know and wants called back if he has recommendations.

## 2017-04-05 ENCOUNTER — Ambulatory Visit: Payer: Medicare Other | Admitting: Radiation Oncology

## 2017-04-05 ENCOUNTER — Ambulatory Visit: Payer: Medicare Other

## 2017-04-05 NOTE — Telephone Encounter (Signed)
I talked with the patient.  He is been off Eliquis for 10-12 days.  He still having some oozing on the bandage.  He notes that the oozing  is less and less every day.  I recommended that he continue to hold the Eliquis for now.  If he has no bleeding for 2 days in a row on his bandage, then I recommended that he restart the Eliquis.  If he has any recurrent bleeding he is been instructed to DC the Eliquis right away.  He will keep [place] informed as to how he is doing.

## 2017-04-06 ENCOUNTER — Ambulatory Visit
Admission: RE | Admit: 2017-04-06 | Discharge: 2017-04-06 | Disposition: A | Discharge status: Home / Self Care | Payer: Medicare Other | Source: Ambulatory Visit | Attending: Radiation Oncology | Admitting: Radiation Oncology

## 2017-04-06 ENCOUNTER — Ambulatory Visit: Payer: Medicare Other | Admitting: Radiation Oncology

## 2017-04-06 DIAGNOSIS — C7951 Secondary malignant neoplasm of bone: Secondary | ICD-10-CM | POA: Insufficient documentation

## 2017-04-06 DIAGNOSIS — C61 Malignant neoplasm of prostate: Secondary | ICD-10-CM | POA: Diagnosis not present

## 2017-04-06 MED ORDER — RADIUM RA 223 DICHLORIDE 30 MCCI/ML IV SOLN
142.3000 | Freq: Once | INTRAVENOUS | Status: AC
  Administered 2017-04-06: 142.3 via INTRAVENOUS

## 2017-04-06 NOTE — Progress Notes (Signed)
Radiation Oncology Follow up Note 6thXofigo injection   Name: [name] [name]   Date:   04/06/2017 MRN:  [id number] DOB: Jun 26, 1940    This 77 y.o. male presents to the clinic today for sixth Xofigo injection.  REFERRING PROVIDER: Noreene Filbert, MD  HPI: Patient is a 77 year old male with stage IV castrate resistant prostate cancer here for his sixth Xofigo infusion. He is doing well not any significant pain at this time.  COMPLICATIONS OF TREATMENT: none  FOLLOW UP COMPLIANCE: keeps appointments   PHYSICAL EXAM:  There were no vitals taken for this visit. Well-developed well-nourished patient in NAD. HEENT reveals PERLA, EOMI, discs not visualized.  Oral cavity is clear. No oral mucosal lesions are identified. Neck is clear without evidence of cervical or supraclavicular adenopathy. Lungs are clear to A&P. Cardiac examination is essentially unremarkable with regular rate and rhythm without murmur rub or thrill. Abdomen is benign with no organomegaly or masses noted. Motor sensory and DTR levels are equal and symmetric in the upper and lower extremities. Cranial nerves II through XII are grossly intact. Proprioception is intact. No peripheral adenopathy or edema is identified. No motor or sensory levels are noted. Crude visual fields are within normal range.  RADIOLOGY RESULTS: Patient is scheduled to have follow-up CT and bone scan this month   PLAN: At this time patient has completed hisXofigo treatment. Will review his upcoming bone scan when it becomes available. I've asked to see him back in follow-up in one month. Patient knows to call sooner with any concerns.  I would like to take this opportunity to thank you for allowing me to participate in the care of your patient.Noreene Filbert, MD

## 2017-04-21 ENCOUNTER — Telehealth: Payer: Self-pay | Admitting: *Deleted

## 2017-04-21 NOTE — Telephone Encounter (Signed)
Returned patient's phone call regarding a "sour stomach and diarrhea." Patient stated,"it started four days ago and it's gotten much better. I drink tomato juice every morning, Boost and poweraide mix. I do not drink water. I have a decreased appetite. I like peanut butter and jelly, sometimes I'll eat a banana." Instructed patient to stop drinking tomato juice every day due to acidity. Stay away from [place] and greasy foods, eat small snacks throughout the day. Get some Imodium (OTC) and follow the directions on the box for diarrhea. Need to start drinking water. Patient verbalized understanding.

## 2017-04-24 ENCOUNTER — Ambulatory Visit: Payer: Medicare Other | Admitting: Radiation Oncology

## 2017-04-28 ENCOUNTER — Other Ambulatory Visit: Payer: Self-pay | Admitting: Cardiovascular Disease

## 2017-04-28 NOTE — Telephone Encounter (Signed)
Eliquis 5mg  refill request received; pt is 77 yrs old, wt-93.5kg, Crea-0.84 on 11/24/16, last seen by Dr. Acie Fredrickson on 10/25/16; will send in refill to requested Pharmacy.

## 2017-05-01 ENCOUNTER — Other Ambulatory Visit (HOSPITAL_COMMUNITY): Payer: Medicare Other

## 2017-05-02 ENCOUNTER — Ambulatory Visit (HOSPITAL_COMMUNITY)
Admission: RE | Admit: 2017-05-02 | Discharge: 2017-05-02 | Disposition: A | Discharge status: Home / Self Care | Payer: Medicare Other | Source: Ambulatory Visit | Attending: Oncology | Admitting: Oncology

## 2017-05-02 ENCOUNTER — Encounter (HOSPITAL_COMMUNITY)
Admission: RE | Admit: 2017-05-02 | Discharge: 2017-05-02 | Disposition: A | Discharge status: Home / Self Care | Payer: Medicare Other | Source: Ambulatory Visit | Attending: Oncology | Admitting: Oncology

## 2017-05-02 DIAGNOSIS — C61 Malignant neoplasm of prostate: Secondary | ICD-10-CM

## 2017-05-02 DIAGNOSIS — C7951 Secondary malignant neoplasm of bone: Secondary | ICD-10-CM | POA: Insufficient documentation

## 2017-05-02 DIAGNOSIS — Z9889 Other specified postprocedural states: Secondary | ICD-10-CM | POA: Diagnosis not present

## 2017-05-02 LAB — POCT I-STAT CREATININE: Creatinine, Ser: 0.9 mg/dL (ref 0.61–1.24)

## 2017-05-02 MED ORDER — TECHNETIUM TC 99M MEDRONATE IV KIT
25.0000 | PACK | Freq: Once | INTRAVENOUS | Status: AC | PRN
  Administered 2017-05-02: 19.1 via INTRAVENOUS

## 2017-05-02 MED ORDER — IOPAMIDOL (ISOVUE-300) INJECTION 61%
100.0000 mL | Freq: Once | INTRAVENOUS | Status: AC | PRN
  Administered 2017-05-02: 100 mL via INTRAVENOUS

## 2017-05-04 ENCOUNTER — Ambulatory Visit (HOSPITAL_COMMUNITY): Payer: Medicare Other

## 2017-05-05 ENCOUNTER — Inpatient Hospital Stay: Payer: Medicare Other

## 2017-05-05 ENCOUNTER — Inpatient Hospital Stay: Payer: Medicare Other | Attending: Oncology | Admitting: Oncology

## 2017-05-05 VITALS — BP 118/77 | HR 105 | Temp 97.8°F | Resp 18 | Ht 71.0 in | Wt 196.4 lb

## 2017-05-05 DIAGNOSIS — R110 Nausea: Secondary | ICD-10-CM | POA: Diagnosis not present

## 2017-05-05 DIAGNOSIS — C61 Malignant neoplasm of prostate: Secondary | ICD-10-CM | POA: Diagnosis not present

## 2017-05-05 DIAGNOSIS — K769 Liver disease, unspecified: Secondary | ICD-10-CM | POA: Diagnosis not present

## 2017-05-05 DIAGNOSIS — Z7984 Long term (current) use of oral hypoglycemic drugs: Secondary | ICD-10-CM

## 2017-05-05 DIAGNOSIS — Z79899 Other long term (current) drug therapy: Secondary | ICD-10-CM

## 2017-05-05 DIAGNOSIS — K449 Diaphragmatic hernia without obstruction or gangrene: Secondary | ICD-10-CM

## 2017-05-05 DIAGNOSIS — M899 Disorder of bone, unspecified: Secondary | ICD-10-CM | POA: Diagnosis not present

## 2017-05-05 DIAGNOSIS — Z7189 Other specified counseling: Secondary | ICD-10-CM

## 2017-05-05 DIAGNOSIS — Z192 Hormone resistant malignancy status: Secondary | ICD-10-CM | POA: Diagnosis not present

## 2017-05-05 DIAGNOSIS — R630 Anorexia: Secondary | ICD-10-CM | POA: Diagnosis not present

## 2017-05-05 DIAGNOSIS — J90 Pleural effusion, not elsewhere classified: Secondary | ICD-10-CM

## 2017-05-05 DIAGNOSIS — N200 Calculus of kidney: Secondary | ICD-10-CM | POA: Diagnosis not present

## 2017-05-05 DIAGNOSIS — K5790 Diverticulosis of intestine, part unspecified, without perforation or abscess without bleeding: Secondary | ICD-10-CM

## 2017-05-05 DIAGNOSIS — Z923 Personal history of irradiation: Secondary | ICD-10-CM

## 2017-05-05 DIAGNOSIS — I517 Cardiomegaly: Secondary | ICD-10-CM

## 2017-05-05 DIAGNOSIS — R634 Abnormal weight loss: Secondary | ICD-10-CM

## 2017-05-05 DIAGNOSIS — I4891 Unspecified atrial fibrillation: Secondary | ICD-10-CM | POA: Diagnosis not present

## 2017-05-05 LAB — CBC WITH DIFFERENTIAL (CANCER CENTER ONLY)
BASOS PCT: 0 %
Basophils Absolute: 0 10*3/uL (ref 0.0–0.1)
EOS ABS: 0.1 10*3/uL (ref 0.0–0.5)
Eosinophils Relative: 1 %
HCT: 27.9 % — ABNORMAL LOW (ref 38.4–49.9)
HEMOGLOBIN: 9.6 g/dL — AB (ref 13.0–17.1)
Lymphocytes Relative: 18 %
Lymphs Abs: 0.9 10*3/uL (ref 0.9–3.3)
MCH: 31.1 pg (ref 27.2–33.4)
MCHC: 34.4 g/dL (ref 32.0–36.0)
MCV: 90.3 fL (ref 79.3–98.0)
MONOS PCT: 9 %
Monocytes Absolute: 0.4 10*3/uL (ref 0.1–0.9)
Neutro Abs: 3.5 10*3/uL (ref 1.5–6.5)
Neutrophils Relative %: 72 %
PLATELETS: 121 10*3/uL — AB (ref 140–400)
RBC: 3.09 MIL/uL — ABNORMAL LOW (ref 4.20–5.82)
RDW: 13.6 % (ref 11.0–14.6)
WBC: 4.9 10*3/uL (ref 4.0–10.3)

## 2017-05-05 LAB — CMP (CANCER CENTER ONLY)
ALBUMIN: 2.8 g/dL — AB (ref 3.5–5.0)
ALT: 9 U/L (ref 0–55)
ANION GAP: 11 (ref 3–11)
AST: 91 U/L — AB (ref 5–34)
Alkaline Phosphatase: 156 U/L — ABNORMAL HIGH (ref 40–150)
BILIRUBIN TOTAL: 1 mg/dL (ref 0.2–1.2)
BUN: 17 mg/dL (ref 7–26)
CHLORIDE: 94 mmol/L — AB (ref 98–109)
CO2: 31 mmol/L — ABNORMAL HIGH (ref 22–29)
Calcium: 8.7 mg/dL (ref 8.4–10.4)
Creatinine: 0.82 mg/dL (ref 0.70–1.30)
GFR, Est AFR Am: >60 mL/min (ref >60)
GLUCOSE: 111 mg/dL (ref 70–140)
POTASSIUM: 2.8 mmol/L — AB (ref 3.5–5.1)
Sodium: 136 mmol/L (ref 136–145)
TOTAL PROTEIN: 5.9 g/dL — AB (ref 6.4–8.3)

## 2017-05-05 MED ORDER — LIDOCAINE-PRILOCAINE 2.5-2.5 % EX CREA: 1.0000 "application " | TOPICAL_CREAM | CUTANEOUS | 0 refills | Status: AC | PRN

## 2017-05-05 MED ORDER — PROCHLORPERAZINE MALEATE 10 MG PO TABS: 10.0000 mg | ORAL_TABLET | Freq: Four times a day (QID) | ORAL | 0 refills | Status: DC | PRN

## 2017-05-05 NOTE — Progress Notes (Signed)
Hematology and Oncology Follow Up Visit  [name] [name] [id number] 07/27/1940 77 y.o. 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  He was found to have Gleason score of 3+4 = 7 and a PSA of 4.4.   Prior Therapy: He was treated with external beam radiation completed in October 2010.   He was started on Firmagon by Dr. Jeffie Pollock in May 2015 and his PSA dropped to 18 in October 2015 after he developed recurrent disease with lymphadenopathy.   He developed castration resistant disease with a rising PSA up to 48. Xtandi 160 mg daily started in May 2016. Therapy discontinued in November 2017 because of progression of disease.  Xofigo monthly basis started on 10/26/2016.  He completed 6 cycles of therapy.  Zytiga 1000 mg daily with prednisone started in December 2017.  Therapy discontinued on May 05, 2017 because of progression of disease.  Current therapy:  Androgen deprivation under the care of Dr. Jeffie Pollock.   Under consideration to start systemic chemotherapy.   Interim History:  [name] [name] is here for a follow-up.  Since last visit, he reported some decline in his overall health.  He is reporting decline in his appetite and have lost some weight.  He reports mild nausea and queasy feeling in his stomach although no vomiting or abdominal pain.  He did have hernia surgery in the last 6 weeks which have contributed to the symptoms.  He denies any back pain, bone pain or pathological fractures.  He is not exercising regularly like he was doing before.  He denied any urination or defecation difficulties.  He does not report any headaches, blurry vision, double vision or seizures.  He does not report any fevers, chills, sweats.  Patient okay.  Well weight platelets 1000 he does not report any chest pain, palpitation, orthopnea or PND. Does not report any cough  or shortness of breath. He does not report any nausea, vomiting, abdominal pain, change in his bowel habits. He does not report any urgency, hesitancy. Does not report any hematuria or dysuria.  He does not report any arthralgias or myalgias.  He does not report any heat or cold intolerance.  He does not report any skin rashes or lesions.  He does not report any lymphadenopathy or petechiae.  He does not report any anxiety or depression.  Rest of his review of systems is negative.   Medications: I have reviewed the patient's current medications.  Current Outpatient Medications  Medication Sig Dispense Refill  . abiraterone acetate (ZYTIGA) 250 MG tablet TAKE 4 TABLETS (1,000MG ) BY MOUTH ONCE DAILY ON AN EMPTY STOMACH 1 HOUR BEFORE OR 2 HOURS AFTER A MEAL 120 tablet 0  . ELIQUIS 5 MG TABS tablet TAKE 1 TABLET BY MOUTH TWO  TIMES DAILY 180 tablet 3  . hydrALAZINE (APRESOLINE) 50 MG tablet Take 1 tablet (50 mg total) by mouth 2 (two) times daily. 180 tablet 3  . latanoprost (XALATAN) 0.005 % ophthalmic solution Place 1 drop into both eyes at bedtime.     . meclizine (ANTIVERT) 25 MG tablet Take 25 mg by mouth 3 (three) times daily as needed for dizziness.    . metFORMIN (GLUCOPHAGE) 500 MG tablet Take 500 mg by mouth daily with breakfast.     . metoprolol tartrate (LOPRESSOR) 100 MG tablet Take 1 tablet (100 mg total) by mouth 2 (two) times daily. 180 tablet  3  . predniSONE (DELTASONE) 5 MG tablet Take 1 tablet (5 mg total) by mouth daily with breakfast. 90 tablet 3  . simvastatin (ZOCOR) 20 MG tablet Take 20 mg by mouth daily at 6 PM.     . lidocaine-prilocaine (EMLA) cream Apply 1 application topically as needed. 30 g 0  . prochlorperazine (COMPAZINE) 10 MG tablet Take 1 tablet (10 mg total) by mouth every 6 (six) hours as needed for nausea or vomiting. 30 tablet 0   No current facility-administered medications for this visit.      Allergies: No Known Allergies  Past Medical History, Surgical  history, Social history, and Family History reviewed personally and unchanged.  Physical Exam: Blood pressure 118/77, pulse (!) 105, temperature 97.8 F (36.6 C), temperature source Oral, resp. rate 18, height 5\' 11"  (1.803 m), weight 196 lb 6.4 oz (89.1 kg), SpO2 99 %. ECOG: 1 General appearance: Comfortable appearing gentleman without any distress. Head: Normocephalic, without obvious abnormality  Oral mucosa: Mucous membranes are moist and pink. Eyes: Sclera not injected. Lymph nodes: Cervical, supraclavicular, and axillary nodes normal. Heart: Regular rate and rhythm without any murmurs rubs or gallops. Lung: Clear to auscultation without any rhonchi or wheezes or dullness to percussion. Abdomin: Soft, nontender without any rebound or guarding.  No shifting dullness or ascites. Skin: No petechiae or ecchymosis. Musculoskeletal: Full range of motion noted in all extremities. Neurological: No motor, sensory deficits.   Lab Results: Lab Results  Component Value Date   WBC 4.9 05/05/2017   HGB 11.4 (L) 03/30/2017   HCT 27.9 (L) 05/05/2017   MCV 90.3 05/05/2017   PLT 121 (L) 05/05/2017     Chemistry      Component Value Date/Time   NA 136 05/05/2017 0916   NA 141 10/18/2016 0951   K 2.8 (LL) 05/05/2017 0916   K 3.5 10/18/2016 0951   CL 94 (L) 05/05/2017 0916   CO2 31 (H) 05/05/2017 0916   CO2 29 10/18/2016 0951   BUN 17 05/05/2017 0916   BUN 16.6 10/18/2016 0951   CREATININE 0.82 05/05/2017 0916   CREATININE 1.0 10/18/2016 0951      Component Value Date/Time   CALCIUM 8.7 05/05/2017 0916   CALCIUM 9.6 10/18/2016 0951   ALKPHOS 156 (H) 05/05/2017 0916   ALKPHOS 107 10/18/2016 0951   AST 91 (H) 05/05/2017 0916   AST 22 10/18/2016 0951   ALT 9 05/05/2017 0916   ALT 17 10/18/2016 0951   BILITOT 1.0 05/05/2017 0916   BILITOT 0.86 10/18/2016 0951      Results for [name] [name], [name] [name] (MRN [id number]) as of 05/05/2017 10:52  Ref. Range 01/19/2017 11:03 03/16/2017 11:18   Prostatic Specific Antigen Latest Ref Range: 0.00 - 4.00 ng/mL 229.00 (H) 289.00 (H)   EXAM: CT ABDOMEN AND PELVIS WITH CONTRAST  TECHNIQUE: Multidetector CT imaging of the abdomen and pelvis was performed using the standard protocol following bolus administration of intravenous contrast.  CONTRAST:  156mL ISOVUE-300 IOPAMIDOL (ISOVUE-300) INJECTION 61%  COMPARISON:  CT abdomen pelvis 09/06/2016  FINDINGS: Lower chest: Heart is enlarged. Trace pericardial fluid. Coronary arterial vascular calcifications. Left-greater-than-right small layering pleural effusions. Subpleural consolidative opacities within the left greater than right lower lobes.  Hepatobiliary: Liver is heterogeneous in attenuation. Within the subcapsular right hepatic lobe there is a 2.8 x 2.6 cm low-attenuation lesion, new from prior. There is a 1.7 cm low-attenuation lesion within the subcapsular right hepatic lobe (image 18; series 3). Additionally within the hepatic dome there is  a 1.5 cm low-attenuation lesion (image 13; series 3). Gallbladder is unremarkable. No intrahepatic or extrahepatic biliary ductal dilatation.  Pancreas: Unremarkable  Spleen: Unremarkable  Adrenals/Urinary Tract: Adrenal glands are normal. Kidneys enhance symmetrically with contrast. Bilateral nephrolithiasis measuring up to 7 mm within the inferior pole of the left kidney. Subcentimeter too small to characterize low-attenuation lesion interpolar region right kidney. No hydronephrosis. Urinary bladder is thick-walled and decompressed.  Stomach/Bowel: Sigmoid colonic diverticulosis. No CT evidence for acute diverticulitis. No evidence for small bowel obstruction. Normal morphology of the stomach. No free intraperitoneal air. Small amount of fat stranding and fluid within the pelvis.  Vascular/Lymphatic: Normal caliber abdominal aorta. Peripheral calcified atherosclerotic plaque. No  retroperitoneal lymphadenopathy.  Reproductive: Similar appearance to the prostate.  Other: Small bilateral fat containing inguinal hernias. At the site of previously visualized fat containing periumbilical hernia there is now extensive fat stranding (image 60; series 2).  Musculoskeletal: There is a new 1.5 cm sclerotic lesion posterior left acetabulum (image 76; series 2). Interval increase in size of sclerotic lesion involving the left ilium. Interval increase in size of right femoral head lesion measuring 1.3 cm (image 79; series 2), previously 0.2 cm. Extensive additional sclerotic osseous metastatic disease is progressed when compared to prior exam including the T10, T11, T12, L1, L2, L3 and L5 vertebral bodies and multiple bilateral ribs.  There is a new superior endplate compression deformity of the L2 vertebral body. There is a vertically oriented fracture through the base of the anterior osteophyte involving the S1 vertebral body (image 80; series 7). There is a new fracture through the right L5 pedicle extending to the superior articular facet (image 71; series 7). More chronic appearing nondisplaced fracture through the left L5 pars.  IMPRESSION: 1. Interval development of a nondisplaced fracture through the right L5 pedicle extending to the superior articular facet. Additionally there is a more chronic appearing nondisplaced fracture through the left L5 pars. 2. New vertically oriented fracture at the base of the osteophyte involving the anterior S1 vertebral body. 3. New compression deformity of the L2 vertebral body. 4. Significant interval progression of sclerotic osseous metastatic disease when compared to prior exam. 5. Interval development of multiple low-attenuation lesions within the liver concerning for hepatic metastatic disease. These are difficult to visualize with CT. If clinically warranted, MRI can be obtained for further evaluation. 6.  Extensive fat stranding within the periumbilical region at the site of prior fat containing hernia, potentially secondary to infarction of the herniated fat versus interval hernia repair. Recommend clinical correlation. 7. New layering bilateral pleural effusions. 8. Critical Value/emergent results were called by telephone at the time of interpretation on 05/03/2017 at 10:09 am to Dr. Zola Button , who verbally acknowledged these results.  Assessment and Plan:   77 year old gentleman with:  1.  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   He has progressed on Zytiga and completed Xofigo treatment as well.  His bone scan and CT scan obtained on 05/02/2017 were personally reviewed and showed progression of disease.  The bone scan did indicate some response to his disease in the bone but CT scan showed possible progression in the bone and potentially liver lesions.  His PSA continues to rise rather rapidly.  Options of therapy were reviewed today but I feel given his overall functional decline and his recent symptoms, systemic chemotherapy is his best option.  Risks and benefits associated with Taxotere chemotherapy was reviewed today.  Complications include nausea, vomiting,  myelosuppression, neutropenia, neutropenic sepsis among others were reviewed.  A benefit would be to palliation of his disease and potentially extending overall survival.  After discussion today, he is agreeable to proceed at this time.  He will have a chemotherapy education class prior.  2. Androgen depravation therapy: This is to be continued indefinitely under the care of Dr. Jeffie Pollock.  3. Bone directed therapy: Risks and benefits of Delton See was reviewed today.  Complications include osteonecrosis of the jaw and hypocalcemia was discussed.  He is agreeable to proceed after obtaining dental clearance.  4. Atrial fibrillation: He remains on Eliquis at this time.  His rate is  controlled.  5.  IV access: Risks and benefits of Port-A-Cath insertion was discussed today.  He will have that placed prior to starting chemotherapy.  6.  Antiemetics: Prescription for Compazine was made available to the patient today.  7.  Goals of care: Treatment is palliative at this time but his performance status is adequate and aggressive therapy is warranted.  8. Follow-up: Will be in the next few weeks to start systemic chemotherapy.  25  minutes was spent with the patient face-to-face today.  More than 50% of time was dedicated to patient counseling, education and answering questions about diagnosis, prognosis and future plan of care.   Zola Button, MD 3/22/201910:40 AM

## 2017-05-05 NOTE — Progress Notes (Signed)
START ON PATHWAY REGIMEN - Prostate     A cycle is every 21 days:     Docetaxel      Prednisone   **Always confirm dose/schedule in your pharmacy ordering system**    Patient Characteristics: Adenocarcinoma, Metastatic, Castration Resistant, Symptomatic, Docetaxel Eligible Current radiographic evidence of distant metastasis<= Yes Histology: Adenocarcinoma AJCC T Category: cTX Gleason Primary: X AJCC N Category: NX Gleason Secondary: X AJCC M Category: M1c Gleason Score: X AJCC 8 Stage Grouping: IVB PSA Values (ng/mL): X Would you be surprised if this patient died  in the next year<= I would be surprised if this patient died in the next year Intent of Therapy: Non-Curative / Palliative Intent, Discussed with Patient

## 2017-05-06 LAB — PROSTATE-SPECIFIC AG, SERUM (LABCORP): Prostate Specific Ag, Serum: 483.9 ng/mL — ABNORMAL HIGH (ref 0.0–4.0)

## 2017-05-08 ENCOUNTER — Telehealth: Payer: Self-pay | Admitting: *Deleted

## 2017-05-08 ENCOUNTER — Encounter: Payer: Self-pay | Admitting: Radiation Oncology

## 2017-05-08 ENCOUNTER — Other Ambulatory Visit: Payer: Self-pay

## 2017-05-08 ENCOUNTER — Ambulatory Visit
Admission: RE | Admit: 2017-05-08 | Discharge: 2017-05-08 | Disposition: A | Discharge status: Home / Self Care | Payer: Medicare Other | Source: Ambulatory Visit | Attending: Radiation Oncology | Admitting: Radiation Oncology

## 2017-05-08 VITALS — BP 120/78 | HR 70 | Temp 97.5°F | Resp 20 | Wt 196.3 lb

## 2017-05-08 DIAGNOSIS — C7951 Secondary malignant neoplasm of bone: Secondary | ICD-10-CM | POA: Diagnosis not present

## 2017-05-08 DIAGNOSIS — Z923 Personal history of irradiation: Secondary | ICD-10-CM | POA: Insufficient documentation

## 2017-05-08 DIAGNOSIS — C61 Malignant neoplasm of prostate: Secondary | ICD-10-CM | POA: Insufficient documentation

## 2017-05-08 NOTE — Progress Notes (Signed)
Radiation Oncology Follow up Note  Name: [name] [name]   Date:   05/08/2017 MRN:  [id number] DOB: [birth year]    This 77 y.o. male presents to the clinic today for one-month follow-up status post completion of radium 225 treatments for stage IV castrate resistant prostate cancer.  REFERRING PROVIDER: [name], MD  HPI: patient is a 77 year old male now out 1 month having completed Xofigoforcastrate resistant stage IV prostate cancer. He is seen today in routine follow-up is doing well specifically denies any areas of bone pain is taking no pain medication at this time.he recently had a bone scan back in March showing overall improvement foci of increased activity indicating some response to the diffuse bone metastasis.CT scan demonstrated progressive metastatic bone involvement as compared to a scan from 1 year prior. He has been seen by medical oncologyand has been recommended for Taxotere treatment.  COMPLICATIONS OF TREATMENT: none  FOLLOW UP COMPLIANCE: keeps appointments   PHYSICAL EXAM:  BP 120/78   Pulse 70   Temp (!) 97.5 F (36.4 C)   Resp 20   Wt 196 lb 5.1 oz (89 kg)   BMI 27.38 kg/m  No pain is elicited on deep palpation of his spine range of motion of his lower extremities does not elicit pain. Well-developed well-nourished patient in NAD. HEENT reveals PERLA, EOMI, discs not visualized.  Oral cavity is clear. No oral mucosal lesions are identified. Neck is clear without evidence of cervical or supraclavicular adenopathy. Lungs are clear to A&P. Cardiac examination is essentially unremarkable with regular rate and rhythm without murmur rub or thrill. Abdomen is benign with no organomegaly or masses noted. Motor sensory and DTR levels are equal and symmetric in the upper and lower extremities. Cranial nerves II through XII are grossly intact. Proprioception is intact. No peripheral adenopathy or edema is identified. No motor or sensory levels are noted. Crude visual  fields are within normal range.  RADIOLOGY RESULTS: bone scan and CT scans are reviewed and compatible with the above-stated findings  PLAN: this time patient is pain-free secondary to [place]. He does have progressive disease and I believe it is a good idea to start Taxotere treatment at this time under the direction of medical oncology. I've asked to see him back in 4-5 months for follow-up. Patient is to call at anytime with any concerns.  I would like to take this opportunity to thank you for allowing me to participate in the care of your patient.Noreene Filbert, MD

## 2017-05-08 NOTE — Telephone Encounter (Signed)
Spoke with patient, gave results of last PSA 

## 2017-05-08 NOTE — Telephone Encounter (Signed)
Tiffany from [facility] Call[facility]. Port placement will be April 5th. Patient will need to have first chemotherapy treatment on April 2nd peripherally.

## 2017-05-08 NOTE — Telephone Encounter (Signed)
-----   Message from [name], MD sent at 05/08/2017  8:12 AM EDT ----- Please let him him know his PSA is up. We are starting chemo soon to treat that.

## 2017-05-09 NOTE — Telephone Encounter (Signed)
That will be fine. 

## 2017-05-10 ENCOUNTER — Inpatient Hospital Stay: Payer: Medicare Other

## 2017-05-10 ENCOUNTER — Telehealth: Payer: Self-pay | Admitting: Cardiovascular Disease

## 2017-05-10 ENCOUNTER — Telehealth: Payer: Self-pay | Admitting: *Deleted

## 2017-05-10 NOTE — Telephone Encounter (Signed)
New message    1. What dental office are you calling from? [facility]  2. What is your office phone and fax number? Phone [phone], Fax [phone]  3. What type of procedure is the patient having performed? Dental extraction  4. What date is procedure scheduled or is the patient there now? 05/12/2017  5. What is your question (ex. Antibiotics prior to procedure, holding medication-we need to know how long dentist wants pt to hold med)? ELIQUIS

## 2017-05-10 NOTE — Telephone Encounter (Signed)
   Primary Cardiologist: Mertie Moores, MD  Pharmacy:  Please provide recs for holding/not holding Eliquis.  Chart reviewed as part of pre-operative protocol coverage. Patient with a hx of permanent AFib, prostate CA, HTN, DM.  CHADS2-VASc=4 (age x 2, HTN, DM).  Patient needs dental extraction.  No cardiac testing needed for simple extraction.   Will route to pharmacy for rec'd re: anticoagulation.   Richardson Dopp, PA-C 05/10/2017, 4:44 PM

## 2017-05-10 NOTE — Telephone Encounter (Signed)
Spoke with patient, okay for patient to have extraction of 2 loose teeth 05/12/17 this week and to have chemotherapy on April 2nd of next wee. Patient verbalizes understanding.

## 2017-05-10 NOTE — Telephone Encounter (Signed)
Preop callback staff: Patient may proceed with dental extraction. He should remain on Eliquis without interruption. I have faxed letter with recommendations.  Please contact requesting provider and make sure fax was received.  Richardson Dopp, PA-C    05/10/2017 4:56 PM

## 2017-05-10 NOTE — Telephone Encounter (Signed)
Recommend continuing Eliquis for single dental extraction.

## 2017-05-11 ENCOUNTER — Telehealth: Payer: Self-pay | Admitting: *Deleted

## 2017-05-11 ENCOUNTER — Telehealth: Payer: Self-pay | Admitting: Cardiovascular Disease

## 2017-05-11 NOTE — Telephone Encounter (Signed)
Patient called and stated,"I have a problem. The dentist wants to pull/extract two teeth tomorrow that are loose before I start chemotherapy next week. I am on Eliquis. My last dose was Tuesday night. My dentist is closed today." I informed Dr. Alen Blew, and he said,"stay off Eliquis until the end of next week. The port-a-cath is being placed on Friday, April 5th." he verbalized understanding.

## 2017-05-11 NOTE — Telephone Encounter (Signed)
I called [place] to confirm if receieved clearance letter sent through [facility]. I Lmtcb to call back if letter has not been received and we will fax over a hard copy to their office.

## 2017-05-11 NOTE — Telephone Encounter (Signed)
New message   Patient called this in, dental office is closed today   1. What dental office are you calling from? Dr Eyvonne Left   2. What is your office phone and fax number? [facility] fax [phone]   3. What type of procedure is the patient having performed? 2 extractions  4. What date is procedure scheduled or is the patient there now? 05/12/17   5. What is your question (ex. Antibiotics prior to procedure, holding medication-we need to know how long dentist wants pt to hold med)? Patient on [facility] Pre-operative Risk Assessment    Request for surgical clearance:  1. What type of surgery is being performed? Having a port put in for chemo treatment  2. When is this surgery scheduled?  Patient not sure of date   3. What type of clearance is required (medical clearance vs. Pharmacy clearance to hold med vs. Both)?  Both   4. Are there any medications that need to be held prior to surgery and how long? eliquis  5. Practice name and name of physician performing surgery?  Dr Alen Blew at [facility]   6. What is your office phone and fax number? [phone]   7. Anesthesia type (None, local, MAC, general) ?  Patient did not know , he called this in , refused to take any information about having their office call in the information    Howie Ill 05/11/2017, 8:38 AM  _________________________________________________________________   (provider comments below)

## 2017-05-12 ENCOUNTER — Telehealth: Payer: Self-pay | Admitting: *Deleted

## 2017-05-12 ENCOUNTER — Telehealth: Payer: Self-pay | Admitting: Cardiovascular Disease

## 2017-05-12 ENCOUNTER — Ambulatory Visit (INDEPENDENT_AMBULATORY_CARE_PROVIDER_SITE_OTHER): Payer: Medicare Other | Admitting: Urology

## 2017-05-12 DIAGNOSIS — R9721 Rising PSA following treatment for malignant neoplasm of prostate: Secondary | ICD-10-CM | POA: Diagnosis not present

## 2017-05-12 DIAGNOSIS — C7951 Secondary malignant neoplasm of bone: Secondary | ICD-10-CM

## 2017-05-12 DIAGNOSIS — N3941 Urge incontinence: Secondary | ICD-10-CM

## 2017-05-12 DIAGNOSIS — C61 Malignant neoplasm of prostate: Secondary | ICD-10-CM

## 2017-05-12 DIAGNOSIS — Z192 Hormone resistant malignancy status: Secondary | ICD-10-CM

## 2017-05-12 NOTE — Telephone Encounter (Signed)
Follow Up:    Maudie Mercury said she did not receive the fax on yesterday.She needs instuctions asap,concerning pt's Eliquis. Please fax asap to [phone].

## 2017-05-12 NOTE — Telephone Encounter (Signed)
Encounter closed. Duplicate encounter

## 2017-05-12 NOTE — Telephone Encounter (Signed)
[place] called in requesting information about the patient's extractions. Per previous clearance note:  Preop callback staff: Patient may proceed with dental extraction. He should remain on Eliquis without interruption. I have faxed letter with recommendations.  Please contact requesting provider and make sure fax was received.  Vayda Dungee Dopp, PA-C    05/10/2017 4:56 PM    They have verbalized their understanding.

## 2017-05-12 NOTE — Telephone Encounter (Signed)
Encounter closed. Please see previous note. Dental office has been notified.

## 2017-05-15 ENCOUNTER — Encounter: Payer: Self-pay | Admitting: Oncology

## 2017-05-15 NOTE — Progress Notes (Signed)
Adding to previous note:  Pt is also overqualified for the [facility] and Prostate grants.

## 2017-05-15 NOTE — Progress Notes (Signed)
Called pt to introduce myself as his Arboriculturist and to discuss copay assistance.  Pt gave me consent to apply in his behalf and he was approved w/ PAN for $7,500 to cover Taxotere and Xgeva.

## 2017-05-16 ENCOUNTER — Inpatient Hospital Stay: Payer: Medicare Other

## 2017-05-16 ENCOUNTER — Other Ambulatory Visit: Payer: Self-pay | Admitting: *Deleted

## 2017-05-16 ENCOUNTER — Inpatient Hospital Stay: Payer: Medicare Other | Attending: Oncology

## 2017-05-16 VITALS — BP 150/91 | HR 101 | Temp 98.0°F | Resp 16

## 2017-05-16 DIAGNOSIS — Z9221 Personal history of antineoplastic chemotherapy: Secondary | ICD-10-CM | POA: Diagnosis not present

## 2017-05-16 DIAGNOSIS — C61 Malignant neoplasm of prostate: Secondary | ICD-10-CM | POA: Insufficient documentation

## 2017-05-16 DIAGNOSIS — D631 Anemia in chronic kidney disease: Secondary | ICD-10-CM | POA: Insufficient documentation

## 2017-05-16 DIAGNOSIS — Z192 Hormone resistant malignancy status: Secondary | ICD-10-CM | POA: Diagnosis not present

## 2017-05-16 DIAGNOSIS — Z7689 Persons encountering health services in other specified circumstances: Secondary | ICD-10-CM | POA: Insufficient documentation

## 2017-05-16 DIAGNOSIS — G893 Neoplasm related pain (acute) (chronic): Secondary | ICD-10-CM | POA: Insufficient documentation

## 2017-05-16 DIAGNOSIS — Z5111 Encounter for antineoplastic chemotherapy: Secondary | ICD-10-CM | POA: Diagnosis not present

## 2017-05-16 DIAGNOSIS — C7951 Secondary malignant neoplasm of bone: Secondary | ICD-10-CM | POA: Diagnosis not present

## 2017-05-16 DIAGNOSIS — Z79899 Other long term (current) drug therapy: Secondary | ICD-10-CM | POA: Insufficient documentation

## 2017-05-16 DIAGNOSIS — Z7901 Long term (current) use of anticoagulants: Secondary | ICD-10-CM | POA: Diagnosis not present

## 2017-05-16 DIAGNOSIS — I4891 Unspecified atrial fibrillation: Secondary | ICD-10-CM | POA: Diagnosis not present

## 2017-05-16 LAB — CMP (CANCER CENTER ONLY)
ALBUMIN: 3.2 g/dL — AB (ref 3.5–5.0)
ALK PHOS: 132 U/L (ref 40–150)
ALT: 14 U/L (ref 0–55)
AST: 94 U/L — ABNORMAL HIGH (ref 5–34)
Anion gap: 14 — ABNORMAL HIGH (ref 3–11)
BUN: 18 mg/dL (ref 7–26)
CALCIUM: 8.5 mg/dL (ref 8.4–10.4)
CO2: 28 mmol/L (ref 22–29)
CREATININE: 0.86 mg/dL (ref 0.70–1.30)
Chloride: 92 mmol/L — ABNORMAL LOW (ref 98–109)
GFR, Est AFR Am: >60 mL/min (ref >60)
GFR, Estimated: >60 mL/min (ref >60)
Glucose, Bld: 111 mg/dL (ref 70–140)
Potassium: 2.5 mmol/L — CL (ref 3.5–5.1)
SODIUM: 134 mmol/L — AB (ref 136–145)
Total Bilirubin: 0.8 mg/dL (ref 0.2–1.2)
Total Protein: 6 g/dL — ABNORMAL LOW (ref 6.4–8.3)

## 2017-05-16 LAB — CBC WITH DIFFERENTIAL (CANCER CENTER ONLY)
BASOS ABS: 0 10*3/uL (ref 0.0–0.1)
Basophils Relative: 0 %
EOS ABS: 0.1 10*3/uL (ref 0.0–0.5)
Eosinophils Relative: 2 %
HCT: 26.6 % — ABNORMAL LOW (ref 38.4–49.9)
HEMOGLOBIN: 9 g/dL — AB (ref 13.0–17.1)
LYMPHS ABS: 0.8 10*3/uL — AB (ref 0.9–3.3)
Lymphocytes Relative: 16 %
MCH: 30.7 pg (ref 27.2–33.4)
MCHC: 33.8 g/dL (ref 32.0–36.0)
MCV: 90.7 fL (ref 79.3–98.0)
Monocytes Absolute: 0.6 10*3/uL (ref 0.1–0.9)
Monocytes Relative: 11 %
NEUTROS PCT: 71 %
Neutro Abs: 3.8 10*3/uL (ref 1.5–6.5)
Platelet Count: 108 10*3/uL — ABNORMAL LOW (ref 140–400)
RBC: 2.93 MIL/uL — ABNORMAL LOW (ref 4.20–5.82)
RDW: 14.5 % (ref 11.0–14.6)
WBC Count: 5.4 10*3/uL (ref 4.0–10.3)

## 2017-05-16 MED ORDER — SODIUM CHLORIDE 0.9 % IV SOLN
Freq: Once | INTRAVENOUS | Status: DC
  Filled 2017-05-16: qty 250

## 2017-05-16 MED ORDER — PEGFILGRASTIM 6 MG/0.6ML ~~LOC~~ PSKT
PREFILLED_SYRINGE | SUBCUTANEOUS | Status: AC
  Filled 2017-05-16: qty 0.6

## 2017-05-16 MED ORDER — POTASSIUM CHLORIDE CRYS ER 20 MEQ PO TBCR: 20.0000 meq | EXTENDED_RELEASE_TABLET | Freq: Every day | ORAL | 1 refills | Status: AC

## 2017-05-16 MED ORDER — PEGFILGRASTIM 6 MG/0.6ML ~~LOC~~ PSKT
6.0000 mg | PREFILLED_SYRINGE | Freq: Once | SUBCUTANEOUS | Status: AC
  Administered 2017-05-16: 6 mg via SUBCUTANEOUS

## 2017-05-16 MED ORDER — SODIUM CHLORIDE 0.9 % IV SOLN
Freq: Once | INTRAVENOUS | Status: AC
  Administered 2017-05-16: 13:00:00 via INTRAVENOUS

## 2017-05-16 MED ORDER — ACETAMINOPHEN 325 MG PO TABS
650.0000 mg | ORAL_TABLET | Freq: Once | ORAL | Status: AC
  Administered 2017-05-16: 650 mg via ORAL

## 2017-05-16 MED ORDER — ACETAMINOPHEN 325 MG PO TABS
ORAL_TABLET | ORAL | Status: AC
  Filled 2017-05-16: qty 2

## 2017-05-16 MED ORDER — DEXAMETHASONE SODIUM PHOSPHATE 10 MG/ML IJ SOLN
INTRAMUSCULAR | Status: AC
  Filled 2017-05-16: qty 1

## 2017-05-16 MED ORDER — DENOSUMAB 120 MG/1.7ML ~~LOC~~ SOLN: 120.0000 mg | Freq: Once | SUBCUTANEOUS | Status: DC

## 2017-05-16 MED ORDER — SODIUM CHLORIDE 0.9 % IV SOLN
Freq: Once | INTRAVENOUS | Status: AC
  Administered 2017-05-16: 16:00:00 via INTRAVENOUS
  Filled 2017-05-16: qty 250

## 2017-05-16 MED ORDER — DEXAMETHASONE SODIUM PHOSPHATE 10 MG/ML IJ SOLN
10.0000 mg | Freq: Once | INTRAMUSCULAR | Status: AC
  Administered 2017-05-16: 10 mg via INTRAVENOUS

## 2017-05-16 MED ORDER — SODIUM CHLORIDE 0.9 % IV SOLN
60.0000 mg/m2 | Freq: Once | INTRAVENOUS | Status: AC
  Administered 2017-05-16: 130 mg via INTRAVENOUS
  Filled 2017-05-16: qty 13

## 2017-05-16 NOTE — Progress Notes (Signed)
Nutrition Assessment   Reason for Assessment:   Patient identified on Malnutrition Screening report for poor appetite and weight loss.    ASSESSMENT:  77 year old male with prostate cancer with metastatic disease to the bone.  Past medical history of DM, afib, HLD.  Patient receiving chemotherapy.    Met with patient and wife during infusion today.  Patient reports that appetite has been decreased since having hernia surgery 6 weeks ago.  Reports that food does not taste the same either.  Reports that he drinks a boost glucose control daily.  Wife tries to encourage him to eat.  Reports that he likes peanut butter and jelly sandwiches or peanut butter and banana sandwiches.    Reports that he doe not take any DM medication at this time due to weight loss and blood glucose numbers are within normal range.  Has follow-up in June with PCP.    Nutrition Focused Physical Exam: deferred today  Medications: compazine  Labs: K 2.5,  Glucose WNL  Anthropometrics:   Height: 71 inches Weight: 196 lb 5.1 oz on 3/25 UBW: 200s, Noted 226 lb 10/25/2016 13% weight loss in the last 7 months BMI: 27   Estimated Energy Needs  Kcals: 2200-2600 calories/d Protein: 111-133 g/d Fluid: 2.6 L/d  NUTRITION DIAGNOSIS: Inadequate oral intake related to hernia surgery and taste changes as evidenced by 13% weight loss in 7 months and poor po intake   INTERVENTION:   Discussed strategies to help with taste changes.  Handout provided. Encouraged small frequent meals. Discussed oral nutrition supplements and samples given to patient as well as coupons.  Contact information provided    MONITORING, EVALUATION, GOAL: patient will consume adequate calories and protein to meet nutritional needs and prevent further weight loss   NEXT VISIT: as needed  Deedee Lybarger B. Zenia Resides, [place], [place] Registered Dietitian [phone] (pager)

## 2017-05-16 NOTE — Patient Instructions (Addendum)
[facility] Discharge Instructions for Patients Receiving Chemotherapy  Today you received the following chemotherapy agents Taxotere  To help prevent nausea and vomiting after your treatment, we encourage you to take your nausea medication as directed   If you develop nausea and vomiting that is not controlled by your nausea medication, call the clinic.   BELOW ARE SYMPTOMS THAT SHOULD BE REPORTED IMMEDIATELY:  *FEVER GREATER THAN 100.5 F  *CHILLS WITH OR WITHOUT FEVER  NAUSEA AND VOMITING THAT IS NOT CONTROLLED WITH YOUR NAUSEA MEDICATION  *UNUSUAL SHORTNESS OF BREATH  *UNUSUAL BRUISING OR BLEEDING  TENDERNESS IN MOUTH AND THROAT WITH OR WITHOUT PRESENCE OF ULCERS  *URINARY PROBLEMS  *BOWEL PROBLEMS  UNUSUAL RASH Items with * indicate a potential emergency and should be followed up as soon as possible.  Feel free to call the clinic should you have any questions or concerns. The clinic phone number is [phone].  Please show the CHEMO ALERT CARD at check-in to the Emergency Department and triage nurse.   Docetaxel injection What is this medicine? DOCETAXEL (doe se TAX el) is a chemotherapy drug. It targets fast dividing cells, like cancer cells, and causes these cells to die. This medicine is used to treat many types of cancers like breast cancer, certain stomach cancers, head and neck cancer, lung cancer, and prostate cancer. This medicine may be used for other purposes; ask your health care provider or pharmacist if you have questions. COMMON BRAND NAME(S): Docefrez, Taxotere What should I tell my health care provider before I take this medicine? They need to know if you have any of these conditions: -infection (especially a virus infection such as chickenpox, cold sores, or herpes) -liver disease -low blood counts, like low white cell, platelet, or red cell counts -an unusual or allergic reaction to docetaxel, polysorbate 80, other chemotherapy  agents, other medicines, foods, dyes, or preservatives -pregnant or trying to get pregnant -breast-feeding How should I use this medicine? This drug is given as an infusion into a vein. It is administered in a hospital or clinic by a specially trained health care professional. Talk to your pediatrician regarding the use of this medicine in children. Special care may be needed. Overdosage: If you think you have taken too much of this medicine contact a poison control center or emergency room at once. NOTE: This medicine is only for you. Do not share this medicine with others. What if I miss a dose? It is important not to miss your dose. Call your doctor or health care professional if you are unable to keep an appointment. What may interact with this medicine? -cyclosporine -erythromycin -ketoconazole -medicines to increase blood counts like filgrastim, pegfilgrastim, sargramostim -vaccines Talk to your doctor or health care professional before taking any of these medicines: -acetaminophen -aspirin -ibuprofen -ketoprofen -naproxen This list may not describe all possible interactions. Give your health care provider a list of all the medicines, herbs, non-prescription drugs, or dietary supplements you use. Also tell them if you smoke, drink alcohol, or use illegal drugs. Some items may interact with your medicine. What should I watch for while using this medicine? Your condition will be monitored carefully while you are receiving this medicine. You will need important blood work done while you are taking this medicine. This drug may make you feel generally unwell. This is not uncommon, as chemotherapy can affect healthy cells as well as cancer cells. Report any side effects. Continue your course of treatment even though you feel ill   unless your doctor tells you to stop. In some cases, you may be given additional medicines to help with side effects. Follow all directions for their use. Call  your doctor or health care professional for advice if you get a fever, chills or sore throat, or other symptoms of a cold or flu. Do not treat yourself. This drug decreases your body's ability to fight infections. Try to avoid being around people who are sick. This medicine may increase your risk to bruise or bleed. Call your doctor or health care professional if you notice any unusual bleeding. This medicine may contain alcohol in the product. You may get drowsy or dizzy. Do not drive, use machinery, or do anything that needs mental alertness until you know how this medicine affects you. Do not stand or sit up quickly, especially if you are an older patient. This reduces the risk of dizzy or fainting spells. Avoid alcoholic drinks. Do not become pregnant while taking this medicine. Women should inform their doctor if they wish to become pregnant or think they might be pregnant. There is a potential for serious side effects to an unborn child. Talk to your health care professional or pharmacist for more information. Do not breast-feed an infant while taking this medicine. What side effects may I notice from receiving this medicine? Side effects that you should report to your doctor or health care professional as soon as possible: -allergic reactions like skin rash, itching or hives, swelling of the face, lips, or tongue -low blood counts - This drug may decrease the number of white blood cells, red blood cells and platelets. You may be at increased risk for infections and bleeding. -signs of infection - fever or chills, cough, sore throat, pain or difficulty passing urine -signs of decreased platelets or bleeding - bruising, pinpoint red spots on the skin, black, tarry stools, nosebleeds -signs of decreased red blood cells - unusually weak or tired, fainting spells, lightheadedness -breathing problems -fast or irregular heartbeat -low blood pressure -mouth sores -nausea and vomiting -pain, swelling,  redness or irritation at the injection site -pain, tingling, numbness in the hands or feet -swelling of the ankle, feet, hands -weight gain Side effects that usually do not require medical attention (report to your doctor or health care professional if they continue or are bothersome): -bone pain -complete hair loss including hair on your head, underarms, pubic hair, eyebrows, and eyelashes -diarrhea -excessive tearing -changes in the color of fingernails -loosening of the fingernails -nausea -muscle pain -red flush to skin -sweating -weak or tired This list may not describe all possible side effects. Call your doctor for medical advice about side effects. You may report side effects to FDA at 1-800-FDA-1088. Where should I keep my medicine? This drug is given in a hospital or clinic and will not be stored at home. NOTE: This sheet is a summary. It may not cover all possible information. If you have questions about this medicine, talk to your doctor, pharmacist, or health care provider.  2018 Elsevier/Gold Standard (2015-03-05 12:32:56)  

## 2017-05-16 NOTE — Progress Notes (Signed)
Okay for treatment today with K+ results per Dr. Alen Blew.

## 2017-05-17 ENCOUNTER — Other Ambulatory Visit: Payer: Self-pay | Admitting: Radiology

## 2017-05-17 ENCOUNTER — Telehealth: Payer: Self-pay | Admitting: *Deleted

## 2017-05-17 LAB — PROSTATE-SPECIFIC AG, SERUM (LABCORP): Prostate Specific Ag, Serum: 661 ng/mL — ABNORMAL HIGH (ref 0.0–4.0)

## 2017-05-17 NOTE — Telephone Encounter (Signed)
-----   Message from Rennis Harding, RN sent at 05/17/2017 11:52 AM EDT ----- Regarding: Dr. Alen Blew 1st time chemo f/u 1st time chemo f/u from 05/16/2017

## 2017-05-17 NOTE — Telephone Encounter (Signed)
Called patient for first time chemotherapy follow up call. Please see flowsheet, chemotherapy follow up call. Patient had no complaints. He knows to call the office if he has any questions or concerns.

## 2017-05-18 ENCOUNTER — Other Ambulatory Visit: Payer: Self-pay | Admitting: Student

## 2017-05-19 ENCOUNTER — Ambulatory Visit (HOSPITAL_COMMUNITY)
Admission: RE | Admit: 2017-05-19 | Discharge: 2017-05-19 | Disposition: A | Discharge status: Home / Self Care | Payer: Medicare Other | Source: Ambulatory Visit | Attending: Oncology | Admitting: Oncology

## 2017-05-19 ENCOUNTER — Encounter (HOSPITAL_COMMUNITY): Payer: Self-pay

## 2017-05-19 ENCOUNTER — Other Ambulatory Visit: Payer: Self-pay | Admitting: Oncology

## 2017-05-19 DIAGNOSIS — E119 Type 2 diabetes mellitus without complications: Secondary | ICD-10-CM | POA: Diagnosis not present

## 2017-05-19 DIAGNOSIS — Z7902 Long term (current) use of antithrombotics/antiplatelets: Secondary | ICD-10-CM | POA: Diagnosis not present

## 2017-05-19 DIAGNOSIS — I10 Essential (primary) hypertension: Secondary | ICD-10-CM | POA: Insufficient documentation

## 2017-05-19 DIAGNOSIS — C61 Malignant neoplasm of prostate: Secondary | ICD-10-CM | POA: Diagnosis present

## 2017-05-19 DIAGNOSIS — Z9889 Other specified postprocedural states: Secondary | ICD-10-CM | POA: Diagnosis not present

## 2017-05-19 DIAGNOSIS — Z800 Family history of malignant neoplasm of digestive organs: Secondary | ICD-10-CM | POA: Insufficient documentation

## 2017-05-19 DIAGNOSIS — Z7984 Long term (current) use of oral hypoglycemic drugs: Secondary | ICD-10-CM | POA: Diagnosis not present

## 2017-05-19 DIAGNOSIS — Z79899 Other long term (current) drug therapy: Secondary | ICD-10-CM | POA: Diagnosis not present

## 2017-05-19 HISTORY — PX: IR FLUORO GUIDE PORT INSERTION RIGHT: IMG5741

## 2017-05-19 HISTORY — PX: IR US GUIDE VASC ACCESS RIGHT: IMG2390

## 2017-05-19 LAB — CBC WITH DIFFERENTIAL/PLATELET
BASOS PCT: 0 %
Basophils Absolute: 0 10*3/uL (ref 0.0–0.1)
EOS PCT: 1 %
Eosinophils Absolute: 0 10*3/uL (ref 0.0–0.7)
HEMATOCRIT: 23.5 % — AB (ref 39.0–52.0)
HEMOGLOBIN: 8.2 g/dL — AB (ref 13.0–17.0)
Lymphocytes Relative: 14 %
Lymphs Abs: 0.5 10*3/uL — ABNORMAL LOW (ref 0.7–4.0)
MCH: 31.3 pg (ref 26.0–34.0)
MCHC: 34.9 g/dL (ref 30.0–36.0)
MCV: 89.7 fL (ref 78.0–100.0)
MONO ABS: 0.3 10*3/uL (ref 0.1–1.0)
Monocytes Relative: 7 %
NEUTROS PCT: 78 %
Neutro Abs: 2.8 10*3/uL (ref 1.7–7.7)
Platelets: 90 10*3/uL — ABNORMAL LOW (ref 150–400)
RBC: 2.62 MIL/uL — ABNORMAL LOW (ref 4.22–5.81)
RDW: 14.5 % (ref 11.5–15.5)
WBC: 3.6 10*3/uL — ABNORMAL LOW (ref 4.0–10.5)

## 2017-05-19 LAB — BASIC METABOLIC PANEL
Anion gap: 13 (ref 5–15)
BUN: 32 mg/dL — ABNORMAL HIGH (ref 6–20)
CHLORIDE: 93 mmol/L — AB (ref 101–111)
CO2: 26 mmol/L (ref 22–32)
CREATININE: 0.94 mg/dL (ref 0.61–1.24)
Calcium: 7.7 mg/dL — ABNORMAL LOW (ref 8.9–10.3)
GFR calc non Af Amer: >60 mL/min (ref >60)
Glucose, Bld: 129 mg/dL — ABNORMAL HIGH (ref 65–99)
Potassium: 3.3 mmol/L — ABNORMAL LOW (ref 3.5–5.1)
Sodium: 132 mmol/L — ABNORMAL LOW (ref 135–145)

## 2017-05-19 LAB — PROTIME-INR
INR: 1.28
Prothrombin Time: 15.9 seconds — ABNORMAL HIGH (ref 11.4–15.2)

## 2017-05-19 MED ORDER — LIDOCAINE HCL (PF) 1 % IJ SOLN
INTRAMUSCULAR | Status: AC | PRN
  Administered 2017-05-19: 5 mL

## 2017-05-19 MED ORDER — MIDAZOLAM HCL 2 MG/2ML IJ SOLN
INTRAMUSCULAR | Status: AC | PRN
  Administered 2017-05-19: 1 mg via INTRAVENOUS

## 2017-05-19 MED ORDER — FENTANYL CITRATE (PF) 100 MCG/2ML IJ SOLN
INTRAMUSCULAR | Status: AC
  Filled 2017-05-19: qty 2

## 2017-05-19 MED ORDER — LIDOCAINE HCL 1 % IJ SOLN
INTRAMUSCULAR | Status: AC
  Filled 2017-05-19: qty 20

## 2017-05-19 MED ORDER — HEPARIN SOD (PORK) LOCK FLUSH 100 UNIT/ML IV SOLN
INTRAVENOUS | Status: AC | PRN
  Administered 2017-05-19: 500 [IU] via INTRAVENOUS

## 2017-05-19 MED ORDER — CEFAZOLIN SODIUM-DEXTROSE 2-4 GM/100ML-% IV SOLN
2.0000 g | INTRAVENOUS | Status: AC
  Administered 2017-05-19: 2 g via INTRAVENOUS

## 2017-05-19 MED ORDER — HEPARIN SOD (PORK) LOCK FLUSH 100 UNIT/ML IV SOLN
INTRAVENOUS | Status: AC
  Filled 2017-05-19: qty 5

## 2017-05-19 MED ORDER — MIDAZOLAM HCL 2 MG/2ML IJ SOLN
INTRAMUSCULAR | Status: AC
  Filled 2017-05-19: qty 4

## 2017-05-19 MED ORDER — CEFAZOLIN SODIUM-DEXTROSE 2-4 GM/100ML-% IV SOLN
INTRAVENOUS | Status: AC
  Administered 2017-05-19: 2 g via INTRAVENOUS
  Filled 2017-05-19: qty 100

## 2017-05-19 MED ORDER — SODIUM CHLORIDE 0.9 % IV SOLN
INTRAVENOUS | Status: DC
  Administered 2017-05-19: 12:00:00 via INTRAVENOUS

## 2017-05-19 MED ORDER — FENTANYL CITRATE (PF) 100 MCG/2ML IJ SOLN
INTRAMUSCULAR | Status: AC | PRN
  Administered 2017-05-19: 50 ug via INTRAVENOUS

## 2017-05-19 MED ORDER — LIDOCAINE HCL (PF) 1 % IJ SOLN
INTRAMUSCULAR | Status: AC | PRN
  Administered 2017-05-19: 20 mL

## 2017-05-19 NOTE — Discharge Instructions (Signed)
Implanted Port Insertion, Care After This sheet gives you information about how to care for yourself after your procedure. Your health care provider may also give you more specific instructions. If you have problems or questions, contact your health care provider. What can I expect after the procedure? After your procedure, it is common to have:  Discomfort at the port insertion site.  Bruising on the skin over the port. This should improve over 3-4 days.  Follow these instructions at home: [facility] care  After your port is placed, you will get a manufacturer's information card. The card has information about your port. Keep this card with you at all times.  Take care of the port as told by your health care provider. Ask your health care provider if you or a family member can get training for taking care of the port at home. A home health care nurse may also take care of the port.  Make sure to remember what type of port you have. Incision care  Follow instructions from your health care provider about how to take care of your port insertion site. Make sure you: ? Wash your hands with soap and water before you change your bandage (dressing). If soap and water are not available, use hand sanitizer. ? Change your dressing as told by your health care provider.  You may remove your dressing tomorrow. ? Leave skin glue in place. These skin closures may need to stay in place for 2 weeks or longer. If adhesive strip edges start to loosen and curl up, you may trim the loose edges. Do not remove adhesive strips completely unless your health care provider tells you to do that.  DO NOT use EMLA cream for 2 weeks after port placement as this cream will remove surgical glue on your incision.  Check your port insertion site every day for signs of infection. Check for: ? More redness, swelling, or pain. ? More fluid or blood. ? Warmth. ? Pus or a bad smell. General instructions  Do not take baths, swim,  or use a hot tub until your health care provider approves.  You may shower tomorrow.  Do not lift anything that is heavier than 10 lb (4.5 kg) for a week, or as told by your health care provider.  Ask your health care provider when it is okay to: ? Return to work or school. ? Resume usual physical activities or sports.  Do not drive for 24 hours if you were given a medicine to help you relax (sedative).  Take over-the-counter and prescription medicines only as told by your health care provider.  Wear a medical alert bracelet in case of an emergency. This will tell any health care providers that you have a port.  Keep all follow-up visits as told by your health care provider. This is important. Contact a health care provider if:  You cannot flush your port with saline as directed, or you cannot draw blood from the port.  You have a fever or chills.  You have more redness, swelling, or pain around your port insertion site.  You have more fluid or blood coming from your port insertion site.  Your port insertion site feels warm to the touch.  You have pus or a bad smell coming from the port insertion site. Get help right away if:  You have chest pain or shortness of breath.  You have bleeding from your port that you cannot control. Summary  Take care of the port as  told by your health care provider.  Change your dressing as told by your health care provider.  Keep all follow-up visits as told by your health care provider. This information is not intended to replace advice given to you by your health care provider. Make sure you discuss any questions you have with your health care provider. Document Released: 11/21/2012 Document Revised: 12/23/2015 Document Reviewed: 12/23/2015 Elsevier Interactive Patient Education  2017 [employer].   Moderate Conscious Sedation, Adult, Care After These instructions provide you with information about caring for yourself after your  procedure. Your health care provider may also give you more specific instructions. Your treatment has been planned according to current medical practices, but problems sometimes occur. Call your health care provider if you have any problems or questions after your procedure. What can I expect after the procedure? After your procedure, it is common:  To feel sleepy for several hours.  To feel clumsy and have poor balance for several hours.  To have poor judgment for several hours.  To vomit if you eat too soon.  Follow these instructions at home: For at least 24 hours after the procedure:   Do not: ? Participate in activities where you could fall or become injured. ? Drive. ? Use heavy machinery. ? Drink alcohol. ? Take sleeping pills or medicines that cause drowsiness. ? Make important decisions or sign legal documents. ? Take care of children on your own.  Rest. Eating and drinking  Follow the diet recommended by your health care provider.  If you vomit: ? Drink water, juice, or soup when you can drink without vomiting. ? Make sure you have little or no nausea before eating solid foods. General instructions  Have a responsible adult stay with you until you are awake and alert.  Take over-the-counter and prescription medicines only as told by your health care provider.  If you smoke, do not smoke without supervision.  Keep all follow-up visits as told by your health care provider. This is important. Contact a health care provider if:  You keep feeling nauseous or you keep vomiting.  You feel light-headed.  You develop a rash.  You have a fever. Get help right away if:  You have trouble breathing. This information is not intended to replace advice given to you by your health care provider. Make sure you discuss any questions you have with your health care provider. Document Released: 11/21/2012 Document Revised: 07/06/2015 Document Reviewed: 05/23/2015 Elsevier  Interactive Patient Education  [employer].

## 2017-05-19 NOTE — Procedures (Signed)
RIJV PAC SVC RA EBL 0 Comp 0 

## 2017-05-19 NOTE — Consult Note (Signed)
Chief Complaint: Patient was seen in consultation today for Port-A-Cath placement  Referring Physician(s): [name]  Supervising Physician: Marybelle Killings  Patient Status: [place]  History of Present Illness: [name] [name] is a 77 y.o. male with history of advanced castrate resistant prostate carcinoma, initially diagnosed in 2010, status post chemoradiation and androgen deprivation.  He now has disease progression, has poor venous access and presents today for Port-A-Cath placement for additional palliative chemotherapy.   Past Medical History:  Diagnosis Date  . Diabetes mellitus without complication ([place])   . Hypertension   . Prostate cancer ([place])     No past surgical history on file.  Allergies: Patient has no known allergies.  Medications: Prior to Admission medications   Medication Sig Start Date End Date Taking? Authorizing Provider  ELIQUIS 5 MG TABS tablet TAKE 1 TABLET BY MOUTH TWO  TIMES DAILY 04/28/17   Nahser, Wonda Cheng, MD  hydrALAZINE (APRESOLINE) 50 MG tablet Take 1 tablet (50 mg total) by mouth 2 (two) times daily. 10/25/16   Nahser, Wonda Cheng, MD  latanoprost (XALATAN) 0.005 % ophthalmic solution Place 1 drop into both eyes at bedtime.  06/15/13   [provider]  lidocaine-prilocaine (EMLA) cream Apply 1 application topically as needed. 05/05/17   [name], MD  meclizine (ANTIVERT) 25 MG tablet Take 25 mg by mouth 3 (three) times daily as needed for dizziness.    [provider]  metFORMIN (GLUCOPHAGE) 500 MG tablet Take 500 mg by mouth daily with breakfast.  05/08/13   [provider]  metoprolol tartrate (LOPRESSOR) 100 MG tablet Take 1 tablet (100 mg total) by mouth 2 (two) times daily. 10/25/16   Nahser, Wonda Cheng, MD  potassium chloride SA (K-DUR,KLOR-CON) 20 MEQ tablet Take 1 tablet (20 mEq total) by mouth daily. 05/16/17   [name], MD  prochlorperazine (COMPAZINE) 10 MG tablet Take 1 tablet (10 mg total)  by mouth every 6 (six) hours as needed for nausea or vomiting. 05/05/17   [name], MD  simvastatin (ZOCOR) 20 MG tablet Take 20 mg by mouth daily at 6 PM.  04/14/13   [provider]     Family History  Problem Relation Age of Onset  . Colon cancer Sister     Social History   Socioeconomic History  . Marital status: Divorced    Spouse name: Not on file  . Number of children: 0  . Years of education: Not on file  . Highest education level: Not on file  Occupational History  . Occupation: retired  Scientific laboratory technician  . Financial resource strain: Not on file  . Food insecurity:    Worry: Not on file    Inability: Not on file  . Transportation needs:    Medical: Not on file    Non-medical: Not on file  Tobacco Use  . Smoking status: Never Smoker  . Smokeless tobacco: Never Used  Substance and Sexual Activity  . Alcohol use: No    Alcohol/week: 0.0 oz    Comment: drinks occasional beer  . Drug use: No  . Sexual activity: Not on file  Lifestyle  . Physical activity:    Days per week: Not on file    Minutes per session: Not on file  . Stress: Not on file  Relationships  . Social connections:    Talks on phone: Not on file    Gets together: Not on file    Attends religious service: Not  on file    Active member of club or organization: Not on file    Attends meetings of clubs or organizations: Not on file    Relationship status: Not on file  Other Topics Concern  . Not on file  Social History Narrative  . Not on file      Review of Systems currently denies fever, headache, chest pain, dyspnea, cough, abdominal pain, nausea, vomiting; he does have vertigo and hip hematoma following a recent medication injection  Vital Signs: BP 100/65   Pulse (!) 101 Comment: irregular, history of a fib  Temp 98.6 F (37 C) (Oral)   Resp 18   Ht 5\' 11"  (1.803 m)   Wt 196 lb (88.9 kg)   SpO2 99%   BMI 27.34 kg/m   Physical Exam awake, alert.  Chest with diminished  breath sounds bases.  Heart with irregularly irregular rhythm.  Abdomen soft, positive bowel sounds, nontender.  Trace to 1+ LE edema bilaterally.  Imaging: Nm Bone Scan Whole Body  Result Date: 05/02/2017 CLINICAL DATA:  History of prostate carcinoma, weakness, known bone metastasis EXAM: NUCLEAR MEDICINE WHOLE BODY BONE SCAN TECHNIQUE: Whole body anterior and posterior images were obtained approximately 3 hours after intravenous injection of radiopharmaceutical. RADIOPHARMACEUTICALS:  M 19.1 Ci Technetium-69m MDP IV COMPARISON:  Bone scan of 09/06/2016 FINDINGS: There are again small foci of activity noted within the lower left sacrum,, anterior lower bilateral ribs, left iliac wing, and within the upper, mid, and lower thoracic spine as well as the upper and lower lumbar spine. However, these areas have diminished in activity somewhat indicating some positive interval response to therapy. The only questionably new activity is within the anterior left lower ninth rib. Activity throughout the femurs is somewhat mottled, which is not seen previously and this finding is of uncertain significance but follow-up is recommended. Activity in the left foot most likely is degenerative but has diminished. IMPRESSION: Overall improvement in the foci of increased activity indicating some positive response to the diffuse bone metastases. The only questionably new focus is within an anterior left lower rib presumably the left anterior ninth rib. Also, there is slightly mottled activity throughout the femurs of questionable significance. Electronically Signed   By: Ivar Drape M.D.   On: 05/02/2017 14:20   Ct Abdomen Pelvis W Contrast  Result Date: 05/03/2017 CLINICAL DATA:  History of prostate cancer.  Follow-up evaluation. EXAM: CT ABDOMEN AND PELVIS WITH CONTRAST TECHNIQUE: Multidetector CT imaging of the abdomen and pelvis was performed using the standard protocol following bolus administration of intravenous  contrast. CONTRAST:  157mL ISOVUE-300 IOPAMIDOL (ISOVUE-300) INJECTION 61% COMPARISON:  CT abdomen pelvis 09/06/2016 FINDINGS: Lower chest: Heart is enlarged. Trace pericardial fluid. Coronary arterial vascular calcifications. Left-greater-than-right small layering pleural effusions. Subpleural consolidative opacities within the left greater than right lower lobes. Hepatobiliary: Liver is heterogeneous in attenuation. Within the subcapsular right hepatic lobe there is a 2.8 x 2.6 cm low-attenuation lesion, new from prior. There is a 1.7 cm low-attenuation lesion within the subcapsular right hepatic lobe (image 18; series 3). Additionally within the hepatic dome there is a 1.5 cm low-attenuation lesion (image 13; series 3). Gallbladder is unremarkable. No intrahepatic or extrahepatic biliary ductal dilatation. Pancreas: Unremarkable Spleen: Unremarkable Adrenals/Urinary Tract: Adrenal glands are normal. Kidneys enhance symmetrically with contrast. Bilateral nephrolithiasis measuring up to 7 mm within the inferior pole of the left kidney. Subcentimeter too small to characterize low-attenuation lesion interpolar region right kidney. No hydronephrosis. Urinary bladder is thick-walled and  decompressed. Stomach/Bowel: Sigmoid colonic diverticulosis. No CT evidence for acute diverticulitis. No evidence for small bowel obstruction. Normal morphology of the stomach. No free intraperitoneal air. Small amount of fat stranding and fluid within the pelvis. Vascular/Lymphatic: Normal caliber abdominal aorta. Peripheral calcified atherosclerotic plaque. No retroperitoneal lymphadenopathy. Reproductive: Similar appearance to the prostate. Other: Small bilateral fat containing inguinal hernias. At the site of previously visualized fat containing periumbilical hernia there is now extensive fat stranding (image 60; series 2). Musculoskeletal: There is a new 1.5 cm sclerotic lesion posterior left acetabulum (image 76; series 2).  Interval increase in size of sclerotic lesion involving the left ilium. Interval increase in size of right femoral head lesion measuring 1.3 cm (image 79; series 2), previously 0.2 cm. Extensive additional sclerotic osseous metastatic disease is progressed when compared to prior exam including the T10, T11, T12, L1, L2, L3 and L5 vertebral bodies and multiple bilateral ribs. There is a new superior endplate compression deformity of the L2 vertebral body. There is a vertically oriented fracture through the base of the anterior osteophyte involving the S1 vertebral body (image 80; series 7). There is a new fracture through the right L5 pedicle extending to the superior articular facet (image 71; series 7). More chronic appearing nondisplaced fracture through the left L5 pars. IMPRESSION: 1. Interval development of a nondisplaced fracture through the right L5 pedicle extending to the superior articular facet. Additionally there is a more chronic appearing nondisplaced fracture through the left L5 pars. 2. New vertically oriented fracture at the base of the osteophyte involving the anterior S1 vertebral body. 3. New compression deformity of the L2 vertebral body. 4. Significant interval progression of sclerotic osseous metastatic disease when compared to prior exam. 5. Interval development of multiple low-attenuation lesions within the liver concerning for hepatic metastatic disease. These are difficult to visualize with CT. If clinically warranted, MRI can be obtained for further evaluation. 6. Extensive fat stranding within the periumbilical region at the site of prior fat containing hernia, potentially secondary to infarction of the herniated fat versus interval hernia repair. Recommend clinical correlation. 7. New layering bilateral pleural effusions. 8. Critical Value/emergent results were called by telephone at the time of interpretation on 05/03/2017 at 10:09 am to Dr. Zola Button , who verbally acknowledged  these results. Electronically Signed   By: Lovey Newcomer M.D.   On: 05/03/2017 10:16    Labs:  CBC: Recent Labs    01/19/17 1108 02/16/17 1117 03/16/17 1128 03/30/17 1255 05/05/17 0916 05/16/17 1030  WBC 5.5 5.8 5.3 5.6 4.9 5.4  HGB 12.2* 11.9* 11.5* 11.4*  --   --   HCT 35.8* 35.3* 34.2* 33.0* 27.9* 26.6*  PLT 173 171 155 183 121* 108*    COAGS: No results for input(s): INR, APTT in the last 8760 hours.  BMP: Recent Labs    10/18/16 0951 11/24/16 1046 05/02/17 1432 05/05/17 0916 05/16/17 1030  NA 141 137  --  136 134*  K 3.5 4.1  --  2.8* 2.5*  CL  --  99*  --  94* 92*  CO2 29 27  --  31* 28  GLUCOSE 125 136*  --  111 111  BUN 16.6 17  --  17 18  CALCIUM 9.6 9.2  --  8.7 8.5  CREATININE 1.0 0.84 0.90 0.82 0.86  GFRNONAA  --  >60  --  >60 >60  GFRAA  --  >60  --  >60 >60    LIVER FUNCTION TESTS: Recent Labs  10/18/16 0951 11/24/16 1046 05/05/17 0916 05/16/17 1030  BILITOT 0.86 0.7 1.0 0.8  AST 22 24 91* 94*  ALT 17 16* 9 14  ALKPHOS 107 95 156* 132  PROT 6.9 7.0 5.9* 6.0*  ALBUMIN 3.7 4.0 2.8* 3.2*    TUMOR MARKERS: No results for input(s): AFPTM, CEA, CA199, CHROMGRNA in the last 8760 hours.  Assessment and Plan: 77 y.o. male with history of advanced castrate resistant prostate carcinoma, initially diagnosed in 2010, status post chemoradiation and androgen deprivation.  He now has disease progression, has poor venous access and presents today for Port-A-Cath placement for additional palliative chemotherapy.Risks and benefits of image guided port-a-catheter placement was discussed with the patient including, but not limited to bleeding, infection, pneumothorax, or fibrin sheath development and need for additional procedures.  All of the patient's questions were answered, patient is agreeable to proceed. Consent signed and in chart.   Labs pending   Thank you for this interesting consult.  I greatly enjoyed meeting [name] [name] and look  forward to participating in their care.  A copy of this report was sent to the requesting provider on this date.  Electronically Signed: D. Rowe Robert, PA-C 05/19/2017, 12:05 PM   I spent a total of 25 minutes    in face to face in clinical consultation, greater than 50% of which was counseling/coordinating care for Port-A-Cath placement

## 2017-05-21 ENCOUNTER — Encounter (HOSPITAL_COMMUNITY): Payer: Self-pay | Admitting: Emergency Medicine

## 2017-05-21 ENCOUNTER — Emergency Department (HOSPITAL_COMMUNITY)
Admission: EM | Admit: 2017-05-21 | Discharge: 2017-05-21 | Disposition: A | Discharge status: Home / Self Care | Payer: Medicare Other | Attending: Emergency Medicine | Admitting: Emergency Medicine

## 2017-05-21 ENCOUNTER — Other Ambulatory Visit: Payer: Self-pay

## 2017-05-21 ENCOUNTER — Emergency Department (HOSPITAL_COMMUNITY): Payer: Medicare Other

## 2017-05-21 DIAGNOSIS — Z7901 Long term (current) use of anticoagulants: Secondary | ICD-10-CM | POA: Insufficient documentation

## 2017-05-21 DIAGNOSIS — Y9389 Activity, other specified: Secondary | ICD-10-CM | POA: Diagnosis not present

## 2017-05-21 DIAGNOSIS — S300XXA Contusion of lower back and pelvis, initial encounter: Secondary | ICD-10-CM | POA: Diagnosis not present

## 2017-05-21 DIAGNOSIS — Z8546 Personal history of malignant neoplasm of prostate: Secondary | ICD-10-CM | POA: Diagnosis not present

## 2017-05-21 DIAGNOSIS — Y33XXXA Other specified events, undetermined intent, initial encounter: Secondary | ICD-10-CM | POA: Diagnosis not present

## 2017-05-21 DIAGNOSIS — S3992XA Unspecified injury of lower back, initial encounter: Secondary | ICD-10-CM | POA: Diagnosis present

## 2017-05-21 DIAGNOSIS — Y929 Unspecified place or not applicable: Secondary | ICD-10-CM | POA: Insufficient documentation

## 2017-05-21 DIAGNOSIS — E119 Type 2 diabetes mellitus without complications: Secondary | ICD-10-CM | POA: Diagnosis not present

## 2017-05-21 DIAGNOSIS — Y999 Unspecified external cause status: Secondary | ICD-10-CM | POA: Diagnosis not present

## 2017-05-21 DIAGNOSIS — I10 Essential (primary) hypertension: Secondary | ICD-10-CM | POA: Insufficient documentation

## 2017-05-21 DIAGNOSIS — Z79899 Other long term (current) drug therapy: Secondary | ICD-10-CM | POA: Insufficient documentation

## 2017-05-21 DIAGNOSIS — I4891 Unspecified atrial fibrillation: Secondary | ICD-10-CM | POA: Insufficient documentation

## 2017-05-21 LAB — CBC WITH DIFFERENTIAL/PLATELET
BASOS PCT: 0 %
Basophils Absolute: 0 10*3/uL (ref 0.0–0.1)
EOS PCT: 1 %
Eosinophils Absolute: 0 10*3/uL (ref 0.0–0.7)
HEMATOCRIT: 22.3 % — AB (ref 39.0–52.0)
Hemoglobin: 7.8 g/dL — ABNORMAL LOW (ref 13.0–17.0)
LYMPHS ABS: 0.8 10*3/uL (ref 0.7–4.0)
Lymphocytes Relative: 32 %
MCH: 31.1 pg (ref 26.0–34.0)
MCHC: 35 g/dL (ref 30.0–36.0)
MCV: 88.8 fL (ref 78.0–100.0)
MONO ABS: 0.4 10*3/uL (ref 0.1–1.0)
Monocytes Relative: 18 %
NEUTROS ABS: 1.2 10*3/uL — AB (ref 1.7–7.7)
Neutrophils Relative %: 49 %
Platelets: 107 10*3/uL — ABNORMAL LOW (ref 150–400)
RBC: 2.51 MIL/uL — ABNORMAL LOW (ref 4.22–5.81)
RDW: 14.3 % (ref 11.5–15.5)
WBC: 2.4 10*3/uL — ABNORMAL LOW (ref 4.0–10.5)

## 2017-05-21 LAB — BASIC METABOLIC PANEL
ANION GAP: 14 (ref 5–15)
BUN: 26 mg/dL — ABNORMAL HIGH (ref 6–20)
CALCIUM: 7.8 mg/dL — AB (ref 8.9–10.3)
CO2: 23 mmol/L (ref 22–32)
Chloride: 92 mmol/L — ABNORMAL LOW (ref 101–111)
Creatinine, Ser: 0.84 mg/dL (ref 0.61–1.24)
GFR calc Af Amer: >60 mL/min (ref >60)
GFR calc non Af Amer: >60 mL/min (ref >60)
GLUCOSE: 121 mg/dL — AB (ref 65–99)
Potassium: 3.2 mmol/L — ABNORMAL LOW (ref 3.5–5.1)
Sodium: 129 mmol/L — ABNORMAL LOW (ref 135–145)

## 2017-05-21 MED ORDER — HEPARIN SOD (PORK) LOCK FLUSH 100 UNIT/ML IV SOLN
500.0000 [IU] | Freq: Once | INTRAVENOUS | Status: AC
  Administered 2017-05-21: 500 [IU]
  Filled 2017-05-21: qty 5

## 2017-05-21 MED ORDER — IOPAMIDOL (ISOVUE-300) INJECTION 61%
INTRAVENOUS | Status: AC
  Filled 2017-05-21: qty 100

## 2017-05-21 MED ORDER — DILTIAZEM HCL 25 MG/5ML IV SOLN
15.0000 mg | Freq: Once | INTRAVENOUS | Status: AC
  Administered 2017-05-21: 15 mg via INTRAVENOUS
  Filled 2017-05-21: qty 5

## 2017-05-21 MED ORDER — OXYCODONE-ACETAMINOPHEN 5-325 MG PO TABS: 1.0000 | ORAL_TABLET | Freq: Three times a day (TID) | ORAL | 0 refills | Status: DC | PRN

## 2017-05-21 MED ORDER — IOPAMIDOL (ISOVUE-300) INJECTION 61%
100.0000 mL | Freq: Once | INTRAVENOUS | Status: AC | PRN
  Administered 2017-05-21: 100 mL via INTRAVENOUS

## 2017-05-21 MED ORDER — SODIUM CHLORIDE 0.9 % IV BOLUS
500.0000 mL | Freq: Once | INTRAVENOUS | Status: AC
  Administered 2017-05-21: 500 mL via INTRAVENOUS

## 2017-05-21 MED ORDER — DILTIAZEM HCL-DEXTROSE 100-5 MG/100ML-% IV SOLN (PREMIX)
5.0000 mg/h | INTRAVENOUS | Status: DC
  Filled 2017-05-21: qty 100

## 2017-05-21 MED ORDER — METOPROLOL TARTRATE 25 MG PO TABS
100.0000 mg | ORAL_TABLET | Freq: Once | ORAL | Status: AC
  Administered 2017-05-21: 100 mg via ORAL
  Filled 2017-05-21: qty 4

## 2017-05-21 MED ORDER — DILTIAZEM LOAD VIA INFUSION
15.0000 mg | Freq: Once | INTRAVENOUS | Status: DC
  Filled 2017-05-21: qty 15

## 2017-05-21 NOTE — ED Notes (Signed)
Patient transported to CT 

## 2017-05-21 NOTE — ED Provider Notes (Signed)
[facility] DEPT Provider Note   CSN: [id number] Arrival date & time: 05/21/17  1104     History   Chief Complaint Chief Complaint  Patient presents with  . Hip Pain    HPI [name] [name] is a 77 y.o. male.  HPI [name] [name] is a 77 y.o. male with history of hypertension, diabetes, atrial fibrillation, prostate cancer, currently on chemotherapy, presents to emergency department complaining of left hip pain.  Patient states that he received Trelstar injection in the left hip few weeks ago.  He states he gets this injections every 6 months and has never had similar reaction.  He states after the injection he has developed bruising and pain to the left buttock.  He states over the last several weeks he has not been able to walk because of the pain.  He states the most comfortable position is sitting down.  He states once he stands up he cannot be standing up for more than a few minutes at a time.  He states pain does not radiate into his groin or down his leg.  He went to emergency department in [place], states had x-rays done, and was given oxycodone which is not helping.  He denies any fever or chills.  He denies any other injuries.  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  He states he feels like this is not a bony pain but a muscle pain.  Past Medical History:  Diagnosis Date  . Diabetes mellitus without complication ([place])    taken off oral meds due to 70 lb weight loss  . Hypertension   . Prostate cancer [facility])     Patient Active Problem List   Diagnosis Date Noted  . Prostate cancer ([place]) 12/14/2015  . Atrial fibrillation ([place]) 06/26/2013  . Benign essential HTN 06/26/2013  . Diabetes mellitus ([place]) 06/26/2013  . Hyperlipidemia 06/26/2013    Past Surgical History:  Procedure Laterality Date  . IR FLUORO GUIDE PORT INSERTION RIGHT  05/19/2017  . IR US GUIDE VASC ACCESS RIGHT  05/19/2017        Home  Medications    Prior to Admission medications   Medication Sig Start Date End Date Taking? Authorizing Provider  ELIQUIS 5 MG TABS tablet TAKE 1 TABLET BY MOUTH TWO  TIMES DAILY 04/28/17   Nahser, Wonda Cheng, MD  hydrALAZINE (APRESOLINE) 50 MG tablet Take 1 tablet (50 mg total) by mouth 2 (two) times daily. 10/25/16   Nahser, Wonda Cheng, MD  latanoprost (XALATAN) 0.005 % ophthalmic solution Place 1 drop into both eyes at bedtime.  06/15/13   [provider]  lidocaine-prilocaine (EMLA) cream Apply 1 application topically as needed. 05/05/17   [name], MD  meclizine (ANTIVERT) 25 MG tablet Take 25 mg by mouth 3 (three) times daily as needed for dizziness.    [provider]  metFORMIN (GLUCOPHAGE) 500 MG tablet Take 500 mg by mouth daily with breakfast.  05/08/13   [provider]  metoprolol tartrate (LOPRESSOR) 100 MG tablet Take 1 tablet (100 mg total) by mouth 2 (two) times daily. 10/25/16   Nahser, Wonda Cheng, MD  potassium chloride SA (K-DUR,KLOR-CON) 20 MEQ tablet Take 1 tablet (20 mEq total) by mouth daily. 05/16/17   [name], MD  prochlorperazine (COMPAZINE) 10 MG tablet Take 1 tablet (10 mg total) by mouth every 6 (six) hours as needed for nausea or vomiting. 05/05/17   [name], MD  simvastatin (ZOCOR) 20 MG tablet Take 20 mg by mouth daily at 6 PM.  04/14/13   [provider]    Family History Family History  Problem Relation Age of Onset  . Colon cancer Sister     Social History Social History   Tobacco Use  . Smoking status: Never Smoker  . Smokeless tobacco: Never Used  Substance Use Topics  . Alcohol use: No    Alcohol/week: 0.0 oz    Comment: drinks occasional beer  . Drug use: No     Allergies   Patient has no known allergies.   Review of Systems Review of Systems  Constitutional: Negative for chills and fever.  Respiratory: Negative for cough, chest tightness and shortness of breath.   Cardiovascular: Negative for  chest pain, palpitations and leg swelling.  Gastrointestinal: Negative for abdominal distention, abdominal pain, diarrhea, nausea and vomiting.  Genitourinary: Negative for dysuria, frequency, hematuria and urgency.  Musculoskeletal: Positive for arthralgias and myalgias. Negative for neck pain and neck stiffness.  Skin: Negative for rash.  Allergic/Immunologic: Negative for immunocompromised state.  Neurological: Negative for dizziness, weakness, light-headedness, numbness and headaches.  All other systems reviewed and are negative.    Physical Exam Updated Vital Signs BP 109/72 (BP Location: Right Arm)   Pulse (!) 129   Temp 98.4 F (36.9 C) (Oral)   Resp 18   SpO2 99%   Physical Exam  Constitutional: He is oriented to person, place, and time. He appears well-developed and well-nourished. No distress.  HENT:  Head: Normocephalic and atraumatic.  Eyes: Conjunctivae are normal.  Neck: Neck supple.  Cardiovascular: Normal rate, regular rhythm and normal heart sounds.  Pulmonary/Chest: Effort normal. No respiratory distress. He has no wheezes. He has no rales.  Abdominal: Soft. Bowel sounds are normal. He exhibits no distension. There is no tenderness. There is no rebound.  Musculoskeletal: He exhibits no edema.  Bruising and small hematoma noted to the left gluteus.  Full range of motion of the left hip.  No pain with straight leg raise.  Distal pulses intact.  Neurological: He is alert and oriented to person, place, and time.  Skin: Skin is warm and dry.  Nursing note and vitals reviewed.    ED Treatments / Results  Labs (all labs ordered are listed, but only abnormal results are displayed) Labs Reviewed - No data to display  EKG None  Radiology Ir US Guide Vasc Access Right  Result Date: 05/19/2017 CLINICAL DATA:  Prostate cancer EXAM: TUNNEL POWER PORT PLACEMENT WITH SUBCUTANEOUS POCKET UTILIZING ULTRASOUND & FLOUROSCOPY FLUOROSCOPY TIME:  1 minutes and 5 seconds.   Thirteen mGy. MEDICATIONS AND MEDICAL HISTORY: Versed 1 mg, Fentanyl 50 mcg. Additional Medications: Ancef 2 g. Antibiotics were given within 2 hours of the procedure. ANESTHESIA/SEDATION: Moderate sedation time: 25 minutes. Nursing monitored the the patient during the procedure. PROCEDURE: After written informed consent was obtained, patient was placed in the supine position on angiographic table. The right neck and chest was prepped and draped in a sterile fashion. Lidocaine was utilized for local anesthesia. The right jugular vein was noted to be patent initially with ultrasound. Under sonographic guidance, a micropuncture needle was inserted into the right IJ vein (Ultrasound and fluoroscopic image documentation was performed). The needle was removed over an 018 wire which was exchanged for a Amplatz. This was advanced into the IVC. An 8-French dilator was advanced over the Amplatz. A small incision was made in the right upper chest over the anterior right  second rib. Utilizing blunt dissection, a subcutaneous pocket was created in the caudal direction. The pocket was irrigated with a copious amount of sterile normal saline. The port catheter was tunneled from the chest incision, and out the neck incision. The reservoir was inserted into the subcutaneous pocket and secured with two 3-0 Ethilon stitches. A peel-away sheath was advanced over the Amplatz wire. The port catheter was cut to measure length and inserted through the peel-away sheath. The peel-away sheath was removed. The chest incision was closed with 3-0 Vicryl interrupted stitches for the subcutaneous tissue and a running of 4-0 Vicryl subcuticular stitch for the skin. The neck incision was closed with a 4-0 Vicryl subcuticular stitch. Derma-bond was applied to both surgical incisions. The port reservoir was flushed and instilled with heparinized saline. No complications. FINDINGS: A right IJ vein Port-A-Cath is in place with its tip at the cavoatrial  junction. COMPLICATIONS: None IMPRESSION: Successful 8 French right internal jugular vein power port placement with its tip at the SVC/RA junction. Electronically Signed   By: Marybelle Killings M.D.   On: 05/19/2017 14:46   Ir Fluoro Guide Port Insertion Right  Result Date: 05/19/2017 CLINICAL DATA:  Prostate cancer EXAM: TUNNEL POWER PORT PLACEMENT WITH SUBCUTANEOUS POCKET UTILIZING ULTRASOUND & FLOUROSCOPY FLUOROSCOPY TIME:  1 minutes and 5 seconds.  Thirteen mGy. MEDICATIONS AND MEDICAL HISTORY: Versed 1 mg, Fentanyl 50 mcg. Additional Medications: Ancef 2 g. Antibiotics were given within 2 hours of the procedure. ANESTHESIA/SEDATION: Moderate sedation time: 25 minutes. Nursing monitored the the patient during the procedure. PROCEDURE: After written informed consent was obtained, patient was placed in the supine position on angiographic table. The right neck and chest was prepped and draped in a sterile fashion. Lidocaine was utilized for local anesthesia. The right jugular vein was noted to be patent initially with ultrasound. Under sonographic guidance, a micropuncture needle was inserted into the right IJ vein (Ultrasound and fluoroscopic image documentation was performed). The needle was removed over an 018 wire which was exchanged for a Amplatz. This was advanced into the IVC. An 8-French dilator was advanced over the Amplatz. A small incision was made in the right upper chest over the anterior right second rib. Utilizing blunt dissection, a subcutaneous pocket was created in the caudal direction. The pocket was irrigated with a copious amount of sterile normal saline. The port catheter was tunneled from the chest incision, and out the neck incision. The reservoir was inserted into the subcutaneous pocket and secured with two 3-0 Ethilon stitches. A peel-away sheath was advanced over the Amplatz wire. The port catheter was cut to measure length and inserted through the peel-away sheath. The peel-away sheath  was removed. The chest incision was closed with 3-0 Vicryl interrupted stitches for the subcutaneous tissue and a running of 4-0 Vicryl subcuticular stitch for the skin. The neck incision was closed with a 4-0 Vicryl subcuticular stitch. Derma-bond was applied to both surgical incisions. The port reservoir was flushed and instilled with heparinized saline. No complications. FINDINGS: A right IJ vein Port-A-Cath is in place with its tip at the cavoatrial junction. COMPLICATIONS: None IMPRESSION: Successful 8 French right internal jugular vein power port placement with its tip at the SVC/RA junction. Electronically Signed   By: Marybelle Killings M.D.   On: 05/19/2017 14:46    Procedures Procedures (including critical care time)  Medications Ordered in ED Medications - No data to display   Initial Impression / Assessment and Plan / ED Course  I have  reviewed the triage vital signs and the nursing notes.  Pertinent labs & imaging results that were available during my care of the patient were reviewed by me and considered in my medical decision making (see chart for details).     Patient with right hip/buttock pain after getting an injection a few weeks ago.  With him being on chemotherapy, immunocompromised, concerning for an infection/deep tissue abscess.  Will get labs, discussed with Dr. Venora Maples, will get CT with contrast.  We do not have MRI right now at [facility] on Sunday, which would have been a better test at this time.  Patient was also found to have A. fib with RVR, he has history of it, and did not take his metoprolol this morning.  I will give him his regular dose.   3:36 PM Patient's heart rate initially improved with meds his metoprolol, down to 110s, however now just spiked back to 130s.  He is receiving IV fluid bolus, will reassess after the bolus.  Patient's BUN is slightly elevated, with creatinine of 0.84, BUN 26, concerning for slight dehydration.  4:02 PM Patient seen by Dr.  Venora Maples.  Discussed to make sure to continue to take oxycodone for pain every 4 hours.  Patient has only been taking 1-2 tablets a day.  CT scan did not show anything concerning.  This is most likely hematoma and muscle pain from the injection and may take time to get better.  Patient continues to be in A. fib with RVR but improving.  Will give a bolus of Cardizem and reassess.  Pt signed out at shift change. Plan to monitor to see if HR comes down. If around 100, OK to go home with follow up. If unable to control, will need cardizem drip and admission.   Vitals:   05/21/17 1300 05/21/17 1400 05/21/17 1530 05/21/17 1536  BP: 103/75 95/68 115/86 115/86  Pulse: (!) 131   (!) 110  Resp: (!) 29 (!) 28 (!) 21 (!) 29  Temp:      TempSrc:      SpO2: 98%   97%     Final Clinical Impressions(s) / ED Diagnoses   Final diagnoses:  Traumatic hematoma of buttock, initial encounter  Atrial fibrillation with rapid ventricular response [facility])    ED Discharge Orders    None       Jeannett Senior, PA-C 05/21/17 [place], Kevin, MD 05/21/17 1711
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
## 2017-05-21 NOTE — ED Notes (Signed)
ED Provider at bedside. 

## 2017-05-21 NOTE — ED Provider Notes (Signed)
Care assumed from [facility], PA_C at shift change with Cardizem bolus pending.  In brief, this patient is a 77 year old male with a history of prostate cancer, atrial fibrillation who presented here for left hip pain.  He had a reassuring workup.  Please see previous providers note for further detail.  Workup patient was found to have A. fib with RVR.  He does have a history of this.  He did not take his home metoprolol this morning.  He was given a regular dose in the department.  This initially improved the patient's heart rate to the 110s, however this rebounded to the 130s.  He was given IV fluids with mild improvement.  Patient was given a Cardizem bolus at time of signout with plan if heart rate is around 100 he is okay to go home.  PLAN: Patient with history of atrial fibrillation with RVR currently with a heart rate of 110 at time of signout awaiting Cardizem bolus.  Plan if less than 100 can go home.  MDM:  Patient given Cardizem bolus in the department.  I observed on 3 separate times since bolus that his heart rate has remained between 87 and 104.  During the last visit while in the room speaking with family his heart rate has remained less than 100.  See documented discharge vitals as below.  Feel the patient is stable for discharge.  Plan to follow up with PCP as needed and return precautions discussed for worsening or new concerning symptoms.   Discharge Vitals Blood pressure 111/70, pulse 95, temperature 98.4 F (36.9 C), temperature source Oral, resp. rate (!) 24, SpO2 95 %.  1. Traumatic hematoma of buttock, initial encounter   2. Atrial fibrillation with rapid ventricular response [facility])       Lorelle Gibbs 05/21/17 1703    Jola Schmidt, MD 05/21/17 1710

## 2017-05-21 NOTE — Discharge Instructions (Addendum)
Make sure to continue your medications. Take oxycodone 1 tab every 4 hrs. Try warm compresses or heating pad to the area. Follow up with family doctor as needed.

## 2017-05-23 ENCOUNTER — Other Ambulatory Visit: Payer: Self-pay | Admitting: Oncology

## 2017-05-31 ENCOUNTER — Encounter: Payer: Self-pay | Admitting: Cardiovascular Disease

## 2017-06-02 ENCOUNTER — Other Ambulatory Visit: Payer: Self-pay | Admitting: Oncology

## 2017-06-02 MED ORDER — OXYCODONE-ACETAMINOPHEN 5-325 MG PO TABS: 1.0000 | ORAL_TABLET | Freq: Three times a day (TID) | ORAL | 0 refills | Status: DC | PRN

## 2017-06-06 ENCOUNTER — Inpatient Hospital Stay: Payer: Medicare Other

## 2017-06-06 ENCOUNTER — Other Ambulatory Visit: Payer: Self-pay | Admitting: *Deleted

## 2017-06-06 ENCOUNTER — Inpatient Hospital Stay (HOSPITAL_BASED_OUTPATIENT_CLINIC_OR_DEPARTMENT_OTHER): Payer: Medicare Other | Admitting: Oncology

## 2017-06-06 ENCOUNTER — Other Ambulatory Visit: Payer: Self-pay

## 2017-06-06 ENCOUNTER — Telehealth: Payer: Self-pay

## 2017-06-06 VITALS — BP 172/107 | HR 94 | Temp 98.5°F | Resp 18

## 2017-06-06 VITALS — BP 97/60 | HR 93 | Temp 97.6°F | Resp 18 | Ht 71.0 in | Wt 184.7 lb

## 2017-06-06 DIAGNOSIS — D631 Anemia in chronic kidney disease: Secondary | ICD-10-CM

## 2017-06-06 DIAGNOSIS — C61 Malignant neoplasm of prostate: Secondary | ICD-10-CM | POA: Diagnosis not present

## 2017-06-06 DIAGNOSIS — Z7901 Long term (current) use of anticoagulants: Secondary | ICD-10-CM | POA: Diagnosis not present

## 2017-06-06 DIAGNOSIS — I4891 Unspecified atrial fibrillation: Secondary | ICD-10-CM

## 2017-06-06 DIAGNOSIS — Z79899 Other long term (current) drug therapy: Secondary | ICD-10-CM | POA: Diagnosis not present

## 2017-06-06 DIAGNOSIS — Z192 Hormone resistant malignancy status: Secondary | ICD-10-CM

## 2017-06-06 DIAGNOSIS — Z9221 Personal history of antineoplastic chemotherapy: Secondary | ICD-10-CM

## 2017-06-06 DIAGNOSIS — G893 Neoplasm related pain (acute) (chronic): Secondary | ICD-10-CM

## 2017-06-06 DIAGNOSIS — Z7689 Persons encountering health services in other specified circumstances: Secondary | ICD-10-CM | POA: Diagnosis not present

## 2017-06-06 DIAGNOSIS — Z5111 Encounter for antineoplastic chemotherapy: Secondary | ICD-10-CM | POA: Diagnosis not present

## 2017-06-06 DIAGNOSIS — C7951 Secondary malignant neoplasm of bone: Secondary | ICD-10-CM

## 2017-06-06 LAB — CBC WITH DIFFERENTIAL (CANCER CENTER ONLY)
BASOS PCT: 0 %
Basophils Absolute: 0 10*3/uL (ref 0.0–0.1)
EOS ABS: 0 10*3/uL (ref 0.0–0.5)
EOS PCT: 0 %
HCT: 22.1 % — ABNORMAL LOW (ref 38.4–49.9)
HEMOGLOBIN: 7.3 g/dL — AB (ref 13.0–17.1)
Lymphocytes Relative: 9 %
Lymphs Abs: 0.8 10*3/uL — ABNORMAL LOW (ref 0.9–3.3)
MCH: 31.2 pg (ref 27.2–33.4)
MCHC: 33 g/dL (ref 32.0–36.0)
MCV: 94.4 fL (ref 79.3–98.0)
MONO ABS: 0.8 10*3/uL (ref 0.1–0.9)
Monocytes Relative: 10 %
NEUTROS ABS: 6.9 10*3/uL — AB (ref 1.5–6.5)
Neutrophils Relative %: 81 %
Platelet Count: 151 10*3/uL (ref 140–400)
RBC: 2.34 MIL/uL — ABNORMAL LOW (ref 4.20–5.82)
RDW: 18 % — ABNORMAL HIGH (ref 11.0–14.6)
WBC Count: 8.4 10*3/uL (ref 4.0–10.3)
nRBC: 1 /100 WBC — ABNORMAL HIGH

## 2017-06-06 LAB — ABO/RH: ABO/RH(D): O POS

## 2017-06-06 LAB — CMP (CANCER CENTER ONLY)
ALBUMIN: 3.3 g/dL — AB (ref 3.5–5.0)
ALK PHOS: 67 U/L (ref 40–150)
ALT: 11 U/L (ref 0–55)
AST: 18 U/L (ref 5–34)
Anion gap: 11 (ref 3–11)
BUN: 14 mg/dL (ref 7–26)
CALCIUM: 8.7 mg/dL (ref 8.4–10.4)
CO2: 22 mmol/L (ref 22–29)
CREATININE: 0.8 mg/dL (ref 0.70–1.30)
Chloride: 101 mmol/L (ref 98–109)
GFR, Est AFR Am: >60 mL/min (ref >60)
GFR, Estimated: >60 mL/min (ref >60)
GLUCOSE: 129 mg/dL (ref 70–140)
Potassium: 4.3 mmol/L (ref 3.5–5.1)
SODIUM: 134 mmol/L — AB (ref 136–145)
Total Bilirubin: 0.5 mg/dL (ref 0.2–1.2)
Total Protein: 5.8 g/dL — ABNORMAL LOW (ref 6.4–8.3)

## 2017-06-06 LAB — PREPARE RBC (CROSSMATCH)

## 2017-06-06 MED ORDER — SODIUM CHLORIDE 0.9 % IV SOLN
250.0000 mL | Freq: Once | INTRAVENOUS | Status: AC
  Administered 2017-06-06: 250 mL via INTRAVENOUS

## 2017-06-06 MED ORDER — DEXAMETHASONE SODIUM PHOSPHATE 10 MG/ML IJ SOLN
INTRAMUSCULAR | Status: AC
  Filled 2017-06-06: qty 1

## 2017-06-06 MED ORDER — DIPHENHYDRAMINE HCL 25 MG PO CAPS
25.0000 mg | ORAL_CAPSULE | Freq: Once | ORAL | Status: AC
  Administered 2017-06-06: 25 mg via ORAL

## 2017-06-06 MED ORDER — SODIUM CHLORIDE 0.9% FLUSH
3.0000 mL | INTRAVENOUS | Status: DC | PRN
  Filled 2017-06-06: qty 10

## 2017-06-06 MED ORDER — DEXAMETHASONE SODIUM PHOSPHATE 10 MG/ML IJ SOLN
10.0000 mg | Freq: Once | INTRAMUSCULAR | Status: AC
  Administered 2017-06-06: 10 mg via INTRAVENOUS

## 2017-06-06 MED ORDER — ACETAMINOPHEN 325 MG PO TABS
650.0000 mg | ORAL_TABLET | Freq: Once | ORAL | Status: AC
  Administered 2017-06-06: 650 mg via ORAL

## 2017-06-06 MED ORDER — SODIUM CHLORIDE 0.9 % IV SOLN: 250.0000 mL | Freq: Once | INTRAVENOUS | Status: DC

## 2017-06-06 MED ORDER — PEGFILGRASTIM 6 MG/0.6ML ~~LOC~~ PSKT
6.0000 mg | PREFILLED_SYRINGE | Freq: Once | SUBCUTANEOUS | Status: AC
  Administered 2017-06-06: 6 mg via SUBCUTANEOUS

## 2017-06-06 MED ORDER — SODIUM CHLORIDE 0.9% FLUSH
10.0000 mL | INTRAVENOUS | Status: DC | PRN
  Administered 2017-06-06: 10 mL
  Filled 2017-06-06: qty 10

## 2017-06-06 MED ORDER — SODIUM CHLORIDE 0.9 % IV SOLN
60.0000 mg/m2 | Freq: Once | INTRAVENOUS | Status: AC
  Administered 2017-06-06: 130 mg via INTRAVENOUS
  Filled 2017-06-06: qty 13

## 2017-06-06 MED ORDER — PROCHLORPERAZINE MALEATE 10 MG PO TABS: 10.0000 mg | ORAL_TABLET | Freq: Four times a day (QID) | ORAL | 3 refills | Status: AC | PRN

## 2017-06-06 MED ORDER — PEGFILGRASTIM 6 MG/0.6ML ~~LOC~~ PSKT
PREFILLED_SYRINGE | SUBCUTANEOUS | Status: AC
Start: 2017-06-06 — End: ?
  Filled 2017-06-06: qty 0.6

## 2017-06-06 MED ORDER — HEPARIN SOD (PORK) LOCK FLUSH 100 UNIT/ML IV SOLN
500.0000 [IU] | Freq: Once | INTRAVENOUS | Status: AC | PRN
  Administered 2017-06-06: 500 [IU]
  Filled 2017-06-06: qty 5

## 2017-06-06 MED ORDER — HEPARIN SOD (PORK) LOCK FLUSH 100 UNIT/ML IV SOLN
250.0000 [IU] | INTRAVENOUS | Status: DC | PRN
  Filled 2017-06-06: qty 5

## 2017-06-06 MED ORDER — DIPHENHYDRAMINE HCL 25 MG PO CAPS
ORAL_CAPSULE | ORAL | Status: AC
  Filled 2017-06-06: qty 1

## 2017-06-06 MED ORDER — ACETAMINOPHEN 325 MG PO TABS
ORAL_TABLET | ORAL | Status: AC
  Filled 2017-06-06: qty 2

## 2017-06-06 MED ORDER — SODIUM CHLORIDE 0.9% FLUSH
10.0000 mL | INTRAVENOUS | Status: DC | PRN
  Filled 2017-06-06: qty 10

## 2017-06-06 MED ORDER — HEPARIN SOD (PORK) LOCK FLUSH 100 UNIT/ML IV SOLN
500.0000 [IU] | Freq: Every day | INTRAVENOUS | Status: DC | PRN
  Filled 2017-06-06: qty 5

## 2017-06-06 NOTE — Progress Notes (Addendum)
Hematology and Oncology Follow Up Visit  [name] [name] [id number] 03-Nov-1940 77 y.o. 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  He was initially diagnosed in 2010 with a Gleason score of 3+4 = 7 and a PSA of 4.4.   Prior Therapy: He was treated with external beam radiation completed in October 2010.   He was started on Firmagon by Dr. Jeffie [name] in May 2015 and his PSA dropped to 18 in October 2015 after he developed recurrent disease with lymphadenopathy.   He developed castration resistant disease with a rising PSA up to 48. Xtandi 160 mg daily started in May 2016. Therapy discontinued in November 2017 because of progression of disease.  Xofigo monthly basis started on 10/26/2016.  He completed 6 cycles of therapy.  Zytiga 1000 mg daily with prednisone started in December 2017.  Therapy discontinued on May 05, 2017 because of progression of disease.  Current therapy:  Androgen deprivation under the care of Dr. Jeffie [name].  Taxotere chemotherapy at 60 mg/m square started on April second 2019.    Interim History:  Mr. [name] [name] is here for a follow-up.  Since her last visit, he has reported that multiple issues and concerns.  He received the first cycle of chemotherapy without any specific complications related to that.  He denied any nausea, vomiting or worsening neuropathy.  He denies any infusion related complications.  He did receive Lupron injection under the care of Dr. Jeffie [name] 4 weeks ago and developed a hematoma on his right gluteal area.  And the other cause him a lot of discomfort and pain and limited his mobility.  His performance status has been limited in activity level has been declined rapidly.  He does report overall fatigued and tired overall.  He is requiring a lot of assistance for his mobility inside the house and get into the car which is  affecting his overall quality of life.  He does not report any headaches, blurry vision, double vision or seizures.  He does not report any fevers, chills, sweats. He does not report any chest pain, palpitation, orthopnea or PND. Does not report any cough or shortness of breath. He does not report any nausea, vomiting, abdominal pain, change in his bowel habits. He does not report any urgency, hesitancy. Does not report any hematuria or dysuria.  He does not report any arthralgias or myalgias.  He does not report any heat or cold intolerance.  He does not report any skin rashes or lesions.  He does not report any lymphadenopathy or petechiae.  He does not report any anxiety or depression.  Rest of his review of systems is negative.   Medications: I have reviewed the patient's current medications.  Current Outpatient Medications  Medication Sig Dispense Refill  . abiraterone acetate (ZYTIGA) 250 MG tablet TAKE 4 TABLETS (1,000MG ) BY MOUTH ONCE DAILY ON AN EMPTY STOMACH 1 HOUR BEFORE OR 2 HOURS AFTER A MEAL 120 tablet 0  . Calcium Citrate-Vitamin D (CITRACAL + D PO) Take 2 tablets by mouth daily.    . Cholecalciferol (VITAMIN D3) 5000 units CAPS Take 5,000 Units by mouth daily.    Marland Kitchen ELIQUIS 5 MG TABS tablet TAKE 1 TABLET BY MOUTH TWO  TIMES DAILY 180 tablet 3  . hydrALAZINE (APRESOLINE) 50 MG tablet Take 1 tablet (50 mg total) by mouth 2 (two) times daily. 180 tablet 3  . irbesartan (AVAPRO)  150 MG tablet Take 150 mg by mouth daily.    Marland Kitchen latanoprost (XALATAN) 0.005 % ophthalmic solution Place 1 drop into both eyes at bedtime.     . lidocaine-prilocaine (EMLA) cream Apply 1 application topically as needed. 30 g 0  . meclizine (ANTIVERT) 25 MG tablet Take 25 mg by mouth 3 (three) times daily as needed for dizziness.    . metoprolol tartrate (LOPRESSOR) 100 MG tablet Take 1 tablet (100 mg total) by mouth 2 (two) times daily. 180 tablet 3  . oxyCODONE-acetaminophen (PERCOCET/ROXICET) 5-325 MG tablet Take  1 tablet by mouth every 8 (eight) hours as needed. 30 tablet 0  . potassium chloride SA (K-DUR,KLOR-CON) 20 MEQ tablet Take 1 tablet (20 mEq total) by mouth daily. 30 tablet 1  . prochlorperazine (COMPAZINE) 10 MG tablet Take 1 tablet (10 mg total) by mouth every 6 (six) hours as needed for nausea or vomiting. 30 tablet 0  . simvastatin (ZOCOR) 20 MG tablet Take 20 mg by mouth daily at 6 PM.      No current facility-administered medications for this visit.      Allergies: No Known Allergies  Past Medical History, Surgical history, Social history, and Family History reviewed personally and unchanged.  Physical Exam: Blood pressure 97/60, pulse 93, temperature 97.6 F (36.4 C), temperature source Oral, resp. rate 18, height 5\' 11"  (1.803 m), weight 184 lb 11.2 oz (83.8 kg), SpO2 100 %.   ECOG: 1 General appearance: Alert, awake without distress. Head: Atraumatic without abnormalities. Oral mucosa: No ulcers or thrush. Eyes: Pupils are equal and round reactive to light. Lymph nodes: No lymphadenopathy palpated in the cervical, supraclavicular, and axillary nodes Heart: Irregular without any murmurs. Lung: Clear in all lung fields without any wheezes or dullness to percussion. Abdomin: Soft, without any rebound or guarding.  Nontender with good bowel sounds. Skin: No skin rashes or lesions.  Visual inspection of his right gluteal area showed no erythema or induration resolving hematoma noted. Musculoskeletal: No clubbing or cyanosis.  No focal deficits noted.  Generalized weakness noted. Neurological: No deficits noted in his motor, sensory exams.   Lab Results: Lab Results  Component Value Date   WBC 2.4 (L) 05/21/2017   HGB 7.8 (L) 05/21/2017   HCT 22.3 (L) 05/21/2017   MCV 88.8 05/21/2017   PLT 107 (L) 05/21/2017     Chemistry      Component Value Date/Time   NA 129 (L) 05/21/2017 1349   NA 141 10/18/2016 0951   K 3.2 (L) 05/21/2017 1349   K 3.5 10/18/2016 0951   CL 92  (L) 05/21/2017 1349   CO2 23 05/21/2017 1349   CO2 29 10/18/2016 0951   BUN 26 (H) 05/21/2017 1349   BUN 16.6 10/18/2016 0951   CREATININE 0.84 05/21/2017 1349   CREATININE 0.86 05/16/2017 1030   CREATININE 1.0 10/18/2016 0951      Component Value Date/Time   CALCIUM 7.8 (L) 05/21/2017 1349   CALCIUM 9.6 10/18/2016 0951   ALKPHOS 132 05/16/2017 1030   ALKPHOS 107 10/18/2016 0951   AST 94 (H) 05/16/2017 1030   AST 22 10/18/2016 0951   ALT 14 05/16/2017 1030   ALT 17 10/18/2016 0951   BILITOT 0.8 05/16/2017 1030   BILITOT 0.86 10/18/2016 0951       Results for BARETT, WHIDBEE (MRN [id number]) as of 06/06/2017 09:25  Ref. Range 05/05/2017 09:16 05/16/2017 10:30  Prostate Specific Ag, Serum Latest Ref Range: 0.0 - 4.0 ng/mL 483.9 (H)  661.0 (H)     Assessment and Plan:   77 year old gentleman with:  1.  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  He is currently receiving Taxotere chemotherapy and received the first cycle on May 16, 2017.  Risks and benefits of continuing chemotherapy was discussed today.  He is ready to proceed with second cycle of at this time.  2. Androgen depravation therapy: Risks and benefits of continuing this therapy was discussed and will continue that indefinitely.  He received his recent injection 4 weeks ago and this will be repeated in 4-6 months.  3. Bone directed therapy: Delton See will be started once he obtains dental clearance.  He is on calcium and vitamin D supplements.  This can be deferred to a later date given his multiple issues.  4. Atrial fibrillation: He developed a rapid ventricular response during emergency department visit on 05/21/2017.  He remains on Eliquis.  His rate is controlled at this time.  5.  IV access: Port-A-Cath was inserted without any complications.  6.  Antiemetics: No nausea vomiting reported.  Compazine is available to him.  7.  Goals of care: He has an incurable  malignancy that could do reasonably palliated with the help of chemotherapy.  His performance status remains adequate and chemotherapy is still warranted.  8.  Anemia: Related to malignancy and recent hematoma.  2 units of packed red cell transfusion will be given to the patient in the near future.  9. Pelvic pain: Related to his recent hematoma and metastatic disease to the bone.  CT scan of the pelvis obtained on May 21, 2017 showed no fractures.  9. Follow-up: Will be in 3 weeks for the next cycle of chemotherapy.  25  minutes was spent with the patient face-to-face today.  More than 50% of time was dedicated to patient counseling, education and coordinating his multifaceted care.  Zola Button, MD 4/23/20199:21 AM

## 2017-06-06 NOTE — Patient Instructions (Signed)
[facility] Discharge Instructions for Patients Receiving Chemotherapy  Today you received the following chemotherapy agents Taxotere  To help prevent nausea and vomiting after your treatment, we encourage you to take your nausea medication as directed   If you develop nausea and vomiting that is not controlled by your nausea medication, call the clinic.   BELOW ARE SYMPTOMS THAT SHOULD BE REPORTED IMMEDIATELY:  *FEVER GREATER THAN 100.5 F  *CHILLS WITH OR WITHOUT FEVER  NAUSEA AND VOMITING THAT IS NOT CONTROLLED WITH YOUR NAUSEA MEDICATION  *UNUSUAL SHORTNESS OF BREATH  *UNUSUAL BRUISING OR BLEEDING  TENDERNESS IN MOUTH AND THROAT WITH OR WITHOUT PRESENCE OF ULCERS  *URINARY PROBLEMS  *BOWEL PROBLEMS  UNUSUAL RASH Items with * indicate a potential emergency and should be followed up as soon as possible.  Feel free to call the clinic should you have any questions or concerns. The clinic phone number is [phone].  Please show the CHEMO ALERT CARD at check-in to the Emergency Department and triage nurse.   Docetaxel injection What is this medicine? DOCETAXEL (doe se TAX el) is a chemotherapy drug. It targets fast dividing cells, like cancer cells, and causes these cells to die. This medicine is used to treat many types of cancers like breast cancer, certain stomach cancers, head and neck cancer, lung cancer, and prostate cancer. This medicine may be used for other purposes; ask your health care provider or pharmacist if you have questions. COMMON BRAND NAME(S): Docefrez, Taxotere What should I tell my health care provider before I take this medicine? They need to know if you have any of these conditions: -infection (especially a virus infection such as chickenpox, cold sores, or herpes) -liver disease -low blood counts, like low white cell, platelet, or red cell counts -an unusual or allergic reaction to docetaxel, polysorbate 80, other chemotherapy  agents, other medicines, foods, dyes, or preservatives -pregnant or trying to get pregnant -breast-feeding How should I use this medicine? This drug is given as an infusion into a vein. It is administered in a hospital or clinic by a specially trained health care professional. Talk to your pediatrician regarding the use of this medicine in children. Special care may be needed. Overdosage: If you think you have taken too much of this medicine contact a poison control center or emergency room at once. NOTE: This medicine is only for you. Do not share this medicine with others. What if I miss a dose? It is important not to miss your dose. Call your doctor or health care professional if you are unable to keep an appointment. What may interact with this medicine? -cyclosporine -erythromycin -ketoconazole -medicines to increase blood counts like filgrastim, pegfilgrastim, sargramostim -vaccines Talk to your doctor or health care professional before taking any of these medicines: -acetaminophen -aspirin -ibuprofen -ketoprofen -naproxen This list may not describe all possible interactions. Give your health care provider a list of all the medicines, herbs, non-prescription drugs, or dietary supplements you use. Also tell them if you smoke, drink alcohol, or use illegal drugs. Some items may interact with your medicine. What should I watch for while using this medicine? Your condition will be monitored carefully while you are receiving this medicine. You will need important blood work done while you are taking this medicine. This drug may make you feel generally unwell. This is not uncommon, as chemotherapy can affect healthy cells as well as cancer cells. Report any side effects. Continue your course of treatment even though you feel ill   unless your doctor tells you to stop. In some cases, you may be given additional medicines to help with side effects. Follow all directions for their use. Call  your doctor or health care professional for advice if you get a fever, chills or sore throat, or other symptoms of a cold or flu. Do not treat yourself. This drug decreases your body's ability to fight infections. Try to avoid being around people who are sick. This medicine may increase your risk to bruise or bleed. Call your doctor or health care professional if you notice any unusual bleeding. This medicine may contain alcohol in the product. You may get drowsy or dizzy. Do not drive, use machinery, or do anything that needs mental alertness until you know how this medicine affects you. Do not stand or sit up quickly, especially if you are an older patient. This reduces the risk of dizzy or fainting spells. Avoid alcoholic drinks. Do not become pregnant while taking this medicine. Women should inform their doctor if they wish to become pregnant or think they might be pregnant. There is a potential for serious side effects to an unborn child. Talk to your health care professional or pharmacist for more information. Do not breast-feed an infant while taking this medicine. What side effects may I notice from receiving this medicine? Side effects that you should report to your doctor or health care professional as soon as possible: -allergic reactions like skin rash, itching or hives, swelling of the face, lips, or tongue -low blood counts - This drug may decrease the number of white blood cells, red blood cells and platelets. You may be at increased risk for infections and bleeding. -signs of infection - fever or chills, cough, sore throat, pain or difficulty passing urine -signs of decreased platelets or bleeding - bruising, pinpoint red spots on the skin, black, tarry stools, nosebleeds -signs of decreased red blood cells - unusually weak or tired, fainting spells, lightheadedness -breathing problems -fast or irregular heartbeat -low blood pressure -mouth sores -nausea and vomiting -pain, swelling,  redness or irritation at the injection site -pain, tingling, numbness in the hands or feet -swelling of the ankle, feet, hands -weight gain Side effects that usually do not require medical attention (report to your doctor or health care professional if they continue or are bothersome): -bone pain -complete hair loss including hair on your head, underarms, pubic hair, eyebrows, and eyelashes -diarrhea -excessive tearing -changes in the color of fingernails -loosening of the fingernails -nausea -muscle pain -red flush to skin -sweating -weak or tired This list may not describe all possible side effects. Call your doctor for medical advice about side effects. You may report side effects to FDA at 1-800-FDA-1088. Where should I keep my medicine? This drug is given in a hospital or clinic and will not be stored at home. NOTE: This sheet is a summary. It may not cover all possible information. If you have questions about this medicine, talk to your doctor, pharmacist, or health care provider.  2018 Elsevier/Gold Standard (2015-03-05 12:32:56)  

## 2017-06-06 NOTE — Telephone Encounter (Signed)
Printed avs and calender of upcoming appointment. Per 4/23 los 

## 2017-06-07 ENCOUNTER — Telehealth: Payer: Self-pay | Admitting: *Deleted

## 2017-06-07 LAB — TYPE AND SCREEN
ABO/RH(D): O POS
ANTIBODY SCREEN: NEGATIVE
Unit division: 0
Unit division: 0

## 2017-06-07 LAB — BPAM RBC
Blood Product Expiration Date: 201905172359
Blood Product Expiration Date: 201905172359
ISSUE DATE / TIME: 201904231213
ISSUE DATE / TIME: 201904231213
Unit Type and Rh: 5100
Unit Type and Rh: 5100

## 2017-06-07 LAB — PROSTATE-SPECIFIC AG, SERUM (LABCORP): PROSTATE SPECIFIC AG, SERUM: 340.5 ng/mL — AB (ref 0.0–4.0)

## 2017-06-07 NOTE — Telephone Encounter (Signed)
-----   Message from [name], MD sent at 06/07/2017  8:02 AM EDT ----- Please let him know his PSA is down from last time.

## 2017-06-07 NOTE — Telephone Encounter (Signed)
Called patient with results of last PSA

## 2017-06-09 ENCOUNTER — Telehealth: Payer: Self-pay | Admitting: *Deleted

## 2017-06-09 MED ORDER — OXYCODONE-ACETAMINOPHEN 5-325 MG PO TABS: 1.0000 | ORAL_TABLET | Freq: Three times a day (TID) | ORAL | 0 refills | Status: DC | PRN

## 2017-06-09 NOTE — Telephone Encounter (Signed)
Done. Please call his pharmacy to make sure they received the Rx

## 2017-06-09 NOTE — Telephone Encounter (Signed)
Spoke with patient, per [facility], in [place],, they did receive his script for percocet.

## 2017-06-09 NOTE — Telephone Encounter (Signed)
Patient calling to say he is still having pain, but the percocet is helping. Would like a refill on percocet and maybe increase the amount of tablets from 30 to maybe 50 or 60 tablets. He would like this sent to the [facility] in [place] if possible.

## 2017-06-12 ENCOUNTER — Encounter

## 2017-06-12 ENCOUNTER — Ambulatory Visit: Payer: Medicare Other | Admitting: Cardiovascular Disease

## 2017-06-26 NOTE — Progress Notes (Signed)
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  He was initially diagnosed in 2010 with a Gleason score of 3+4 = 7 and a PSA of 4.4.  PRIOR THERAPY: He was treated with external beam radiation completed in October 2010.   He was started on Firmagon by Dr. Jeffie Pollock in May 2015 and his PSA dropped to 18 in October 2015 after he developed recurrent disease with lymphadenopathy.  He developed castration resistant disease with a rising PSA up to 48. Xtandi 160 mg daily started in May 2016. Therapy discontinued in November 2017 because of progression of disease.  Xofigo monthly basis started on 10/26/2016.  He completed 6 cycles of therapy.  Zytiga 1000 mg daily with prednisone started in December 2017.  Therapy discontinued on May 05, 2017 because of progression of disease.  CURRENT THERAPY: Androgen deprivation under the care of Dr. Jeffie Pollock.  Taxotere chemotherapy at 60 mg/m square started on 05/16/2017.  Status post 2 cycles.  INTERVAL HISTORY: D'Arcy Abraha Brightbill 77 y.o. male returns for routine follow-up visit accompanied by his friend and brother.  She reports that he is not feeling well today.  He is having more fatigue.  He is having increased left hip pain.  He remains on Percocet every 8 hours.  Pain is not adequately controlled.  He is asking for other methods to control his pain.  The patient denies fevers and chills.  Denies chest pain, shortness of breath, cough, hemoptysis.  Denies nausea, vomiting, constipation, diarrhea.  Denies peripheral neuropathy.  Denies bleeding.  Patient denies headaches, blurred vision, double vision, seizures.  Reports decreased appetite and has lost weight since his last visit.  Denies urinary frequency, urgency, hesitancy and hematuria.  He has no arthralgias or myalgias except for his left hip  pain.  Skin rashes and lesions.  The main review of systems is negative.  The patient is here for evaluation prior to cycle 3 of his chemotherapy.  MEDICAL HISTORY: Past Medical History:  Diagnosis Date  . Diabetes mellitus without complication ([place])    taken off oral meds due to 70 lb weight loss  . Hypertension   . Prostate cancer ([place])     ALLERGIES:  has No Known Allergies.  MEDICATIONS:  Current Outpatient Medications  Medication Sig Dispense Refill  . Calcium Citrate-Vitamin D (CITRACAL + D PO) Take 2 tablets by mouth daily.    . Cholecalciferol (VITAMIN D3) 5000 units CAPS Take 5,000 Units by mouth daily.    Marland Kitchen ELIQUIS 5 MG TABS tablet TAKE 1 TABLET BY MOUTH TWO  TIMES DAILY 180 tablet 3  . hydrALAZINE (APRESOLINE) 50 MG tablet Take 1 tablet (50 mg total) by mouth 2 (two) times daily. 180 tablet 3  . irbesartan (AVAPRO) 150 MG tablet Take 150 mg by mouth daily.    Marland Kitchen latanoprost (XALATAN) 0.005 % ophthalmic solution Place 1 drop into both eyes at bedtime.     . lidocaine-prilocaine (EMLA) cream Apply 1 application topically as needed. 30 g 0  . meclizine (ANTIVERT) 25 MG tablet Take 25 mg by mouth 3 (three) times daily as needed for dizziness.    . metoprolol tartrate (LOPRESSOR) 100 MG tablet Take 1 tablet (100 mg total) by mouth 2 (two) times daily. 180 tablet 3  . morphine (MS CONTIN) 15 MG 12 hr tablet Take 1 tablet (15 mg total)  by mouth every 12 (twelve) hours. 60 tablet 0  . oxyCODONE-acetaminophen (PERCOCET/ROXICET) 5-325 MG tablet Take 1 tablet by mouth every 6 (six) hours as needed. 60 tablet 0  . potassium chloride SA (K-DUR,KLOR-CON) 20 MEQ tablet Take 1 tablet (20 mEq total) by mouth daily. 30 tablet 1  . prochlorperazine (COMPAZINE) 10 MG tablet Take 1 tablet (10 mg total) by mouth every 6 (six) hours as needed for nausea or vomiting. 30 tablet 3  . simvastatin (ZOCOR) 20 MG tablet Take 20 mg by mouth daily at 6 PM.     . valsartan (DIOVAN) 160 MG tablet      No  current facility-administered medications for this visit.     SURGICAL HISTORY:  Past Surgical History:  Procedure Laterality Date  . IR FLUORO GUIDE PORT INSERTION RIGHT  05/19/2017  . IR US GUIDE VASC ACCESS RIGHT  05/19/2017    REVIEW OF SYSTEMS:   Review of Systems  Constitutional: Negative for chills, fever. Positive for fatigue, decreased appetite, weight loss. HENT:   Negative for mouth sores, nosebleeds, sore throat and trouble swallowing.   Eyes: Negative for eye problems and icterus.  Respiratory: Negative for cough, hemoptysis, shortness of breath and wheezing.   Cardiovascular: Negative for chest pain and leg swelling.  Gastrointestinal: Negative for abdominal pain, constipation, diarrhea, nausea and vomiting.  Genitourinary: Negative for bladder incontinence, difficulty urinating, dysuria, frequency and hematuria.   Musculoskeletal: Negative for back pain, neck pain and neck stiffness. Positive for left hip pain. Skin: Negative for itching and rash.  Neurological: Negative for dizziness, extremity weakness, headaches, light-headedness and seizures.  Hematological: Negative for adenopathy. Does not bruise/bleed easily.  Psychiatric/Behavioral: Negative for confusion, depression and sleep disturbance. The patient is not nervous/anxious.     PHYSICAL EXAMINATION:  Blood pressure 99/68, pulse 77, temperature (!) 97.5 F (36.4 C), temperature source Oral, resp. rate 18, height 5\' 11"  (1.803 m), weight 176 lb 8 oz (80.1 kg), SpO2 100 %.  ECOG PERFORMANCE STATUS: 1 - Symptomatic but completely ambulatory  Physical Exam  Constitutional: Oriented to person, place, and time. No distress.  HENT:  Head: Normocephalic and atraumatic.  Mouth/Throat: Oropharynx is clear and moist. No oropharyngeal exudate.  Eyes: Conjunctivae are normal. Right eye exhibits no discharge. Left eye exhibits no discharge. No scleral icterus.  Neck: Normal range of motion. Neck supple.  Cardiovascular:  Heart rate irregular, regular rhythm, normal heart sounds and intact distal pulses.   Pulmonary/Chest: Effort normal and breath sounds normal. No respiratory distress. No wheezes. No rales.  Abdominal: Soft. Bowel sounds are normal. Exhibits no distension and no mass. There is no tenderness.  Musculoskeletal: Normal range of motion. Exhibits no edema.  Lymphadenopathy:    No cervical adenopathy.  Neurological: Alert and oriented to person, place, and time. Exhibits normal muscle tone. Coordination normal.  Skin: Skin is warm and dry. No rash noted. Not diaphoretic. No erythema.   Psychiatric: Mood, memory and judgment normal.  Vitals reviewed.  LABORATORY DATA: Lab Results  Component Value Date   WBC 8.1 06/27/2017   HGB 6.3 (LL) 06/27/2017   HCT 19.3 (L) 06/27/2017   MCV 96.5 06/27/2017   PLT 124 (L) 06/27/2017      Chemistry      Component Value Date/Time   NA 129 (L) 06/27/2017 0810   NA 141 10/18/2016 0951   K 4.3 06/27/2017 0810   K 3.5 10/18/2016 0951   CL 100 06/27/2017 0810   CO2 22 06/27/2017 0810  CO2 29 10/18/2016 0951   BUN 20 06/27/2017 0810   BUN 16.6 10/18/2016 0951   CREATININE 0.79 06/27/2017 0810   CREATININE 1.0 10/18/2016 0951      Component Value Date/Time   CALCIUM 8.1 (L) 06/27/2017 0810   CALCIUM 9.6 10/18/2016 0951   ALKPHOS 61 06/27/2017 0810   ALKPHOS 107 10/18/2016 0951   AST 12 06/27/2017 0810   AST 22 10/18/2016 0951   ALT 9 06/27/2017 0810   ALT 17 10/18/2016 0951   BILITOT 0.5 06/27/2017 0810   BILITOT 0.86 10/18/2016 0951     Results for FINDLEY, VI (MRN [id number]) as of 06/26/2017 16:27  Ref. Range 05/05/2017 09:16 05/16/2017 10:30 06/06/2017 09:03  Prostate Specific Ag, Serum Latest Ref Range: 0.0 - 4.0 ng/mL 483.9 (H) 661.0 (H) 340.5 (H)   RADIOGRAPHIC STUDIES:  No results found.   ASSESSMENT/PLAN:  77 year old gentleman with:  1.  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  He is currently receiving Taxotere chemotherapy and received the first cycle on May 16, 2017.  Risks and benefits of continuing chemotherapy was discussed today.  The patient has had a nice drop in his PSA down from 661.0 to 340.5.  PSA is pending today.  Unfortunately, we will have to hold his chemotherapy due to anemia.  He is having increased left hip pain and will be referred to radiation oncology for consideration of palliative radiation to the left hip.  Chemotherapy will remain on hold while he is receiving radiation.  2. Androgen depravation therapy: Risks and benefits of continuing this therapy was discussed and will continue that indefinitely.  He received his recent injection 8 weeks ago and this will be repeated in 4-6 months.  3. Bone directed therapy: Delton See will be started once he obtains dental clearance.  He is on calcium and vitamin D supplements.  This can be deferred to a later date given his multiple issues.  4. Atrial fibrillation: He developed a rapid ventricular response during emergency department visit on 05/21/2017.  He remains on Eliquis.  His rate is controlled at this time.  5.  IV access: Port-A-Cath was inserted without any complications.  6.  Antiemetics: No nausea vomiting reported.  Compazine is available to him.  7.  Goals of care: He has an incurable malignancy that could do reasonably palliated with the help of chemotherapy.  His performance status remains adequate and chemotherapy is still warranted.  8.  Anemia: Related to malignancy and recent hematoma.    Hemoglobin 6.3 today.  He will receive 2 units of packed red blood cells here in our office today.  9. Pelvic pain: Related to his recent hematoma and metastatic disease to the bone.  CT scan of the pelvis obtained on May 21, 2017 showed no fractures.  He reports that he had a recent MRI by his primary care provider.  Results are not available to [place].  The patient was started on MS  Contin 15 mg every 12 hours with Percocet every 6 hours as needed for breakthrough pain.  Referral has been placed to radiation oncology for consideration of palliative radiation.  10. Follow-up: He will keep his follow-up appointment in approximately 3 weeks for evaluation, repeat lab work and possible chemotherapy.  The patient was seen and examined with Dr Alen Blew.  Orders Placed This Encounter  Procedures  . Ambulatory referral to Radiation Oncology    Referral Priority:   Routine    Referral Type:  Consultation    Referral Reason:   Specialty Services Required    Referred to Provider:   Gery Pray, MD    Requested Specialty:   Radiation Oncology    Number of Visits Requested:   1  . Practitioner attestation of consent    I, the ordering practitioner, attest that I have discussed with the patient the benefits, risks, side effects, alternatives, likelihood of achieving goals and potential problems during recovery for the procedure listed.    Standing Status:   Future    Standing Expiration Date:   06/27/2018    Order Specific Question:   Procedure    Answer:   Blood Product(s)  . Complete patient signature process for consent form    Standing Status:   Future    Standing Expiration Date:   06/27/2018  . Care order/instruction    Transfuse Parameters    Standing Status:   Future    Standing Expiration Date:   06/27/2018  . Sample to Blood Bank    Standing Status:   Future    Standing Expiration Date:   06/28/2018  . Type and screen    Standing Status:   Future    Number of Occurrences:   1    Standing Expiration Date:   06/28/2018  . Sample to Blood Bank    Standing Status:   Future    Standing Expiration Date:   06/28/2018   Mikey Bussing, DNP, AGPCNP-BC, AOCNP 06/27/17   Patient seen and examined personally today.  Patient complains of persistent left hip pain and overall debilitation and weight loss.  His pain has been unmanageable with Percocet.  He did not have any  complications related to systemic chemotherapy.    On exam today ill-appearing gentleman appeared in mild distress.  Is tachycardic and appeared pale.  Sclera anicteric.  Abdomen was soft without any rebound or guarding.  His laboratory data were reviewed showed a hemoglobin 6.3.  I recommended holding chemotherapy given his overall debilitation today, proceeding with 2 units of packed red cell transfusion and referral to radiation oncology to treat his left hip for presumed prostate cancer metastasis to the bone.  We will start him on long-acting morphine for better pain control.  We will resume systemic chemotherapy in 3 weeks.  Zola Button MD 06/27/17

## 2017-06-27 ENCOUNTER — Encounter: Payer: Self-pay | Admitting: Oncology

## 2017-06-27 ENCOUNTER — Inpatient Hospital Stay: Payer: Medicare Other | Attending: Oncology | Admitting: Oncology

## 2017-06-27 ENCOUNTER — Inpatient Hospital Stay: Payer: Medicare Other

## 2017-06-27 VITALS — BP 99/68 | HR 77 | Temp 97.5°F | Resp 18 | Ht 71.0 in | Wt 176.5 lb

## 2017-06-27 DIAGNOSIS — Z79899 Other long term (current) drug therapy: Secondary | ICD-10-CM

## 2017-06-27 DIAGNOSIS — R634 Abnormal weight loss: Secondary | ICD-10-CM | POA: Diagnosis not present

## 2017-06-27 DIAGNOSIS — Z7901 Long term (current) use of anticoagulants: Secondary | ICD-10-CM | POA: Diagnosis not present

## 2017-06-27 DIAGNOSIS — D649 Anemia, unspecified: Secondary | ICD-10-CM

## 2017-06-27 DIAGNOSIS — M25552 Pain in left hip: Secondary | ICD-10-CM | POA: Diagnosis not present

## 2017-06-27 DIAGNOSIS — E119 Type 2 diabetes mellitus without complications: Secondary | ICD-10-CM | POA: Insufficient documentation

## 2017-06-27 DIAGNOSIS — R630 Anorexia: Secondary | ICD-10-CM | POA: Diagnosis not present

## 2017-06-27 DIAGNOSIS — C7951 Secondary malignant neoplasm of bone: Secondary | ICD-10-CM | POA: Diagnosis not present

## 2017-06-27 DIAGNOSIS — I4891 Unspecified atrial fibrillation: Secondary | ICD-10-CM | POA: Diagnosis not present

## 2017-06-27 DIAGNOSIS — I10 Essential (primary) hypertension: Secondary | ICD-10-CM | POA: Insufficient documentation

## 2017-06-27 DIAGNOSIS — Z923 Personal history of irradiation: Secondary | ICD-10-CM

## 2017-06-27 DIAGNOSIS — R5383 Other fatigue: Secondary | ICD-10-CM | POA: Insufficient documentation

## 2017-06-27 DIAGNOSIS — D509 Iron deficiency anemia, unspecified: Secondary | ICD-10-CM | POA: Diagnosis not present

## 2017-06-27 DIAGNOSIS — C61 Malignant neoplasm of prostate: Secondary | ICD-10-CM | POA: Diagnosis not present

## 2017-06-27 DIAGNOSIS — R000 Tachycardia, unspecified: Secondary | ICD-10-CM | POA: Insufficient documentation

## 2017-06-27 DIAGNOSIS — Z95828 Presence of other vascular implants and grafts: Secondary | ICD-10-CM

## 2017-06-27 LAB — PREPARE RBC (CROSSMATCH)

## 2017-06-27 LAB — CMP (CANCER CENTER ONLY)
ALT: 9 U/L (ref 0–55)
ANION GAP: 7 (ref 3–11)
AST: 12 U/L (ref 5–34)
Albumin: 3.1 g/dL — ABNORMAL LOW (ref 3.5–5.0)
Alkaline Phosphatase: 61 U/L (ref 40–150)
BUN: 20 mg/dL (ref 7–26)
CO2: 22 mmol/L (ref 22–29)
CREATININE: 0.79 mg/dL (ref 0.70–1.30)
Calcium: 8.1 mg/dL — ABNORMAL LOW (ref 8.4–10.4)
Chloride: 100 mmol/L (ref 98–109)
Glucose, Bld: 173 mg/dL — ABNORMAL HIGH (ref 70–140)
Potassium: 4.3 mmol/L (ref 3.5–5.1)
SODIUM: 129 mmol/L — AB (ref 136–145)
Total Bilirubin: 0.5 mg/dL (ref 0.2–1.2)
Total Protein: 5.2 g/dL — ABNORMAL LOW (ref 6.4–8.3)

## 2017-06-27 LAB — CBC WITH DIFFERENTIAL (CANCER CENTER ONLY)
Basophils Absolute: 0 10*3/uL (ref 0.0–0.1)
Basophils Relative: 0 %
EOS PCT: 0 %
Eosinophils Absolute: 0 10*3/uL (ref 0.0–0.5)
HCT: 19.3 % — ABNORMAL LOW (ref 38.4–49.9)
Hemoglobin: 6.3 g/dL — CL (ref 13.0–17.1)
LYMPHS ABS: 0.4 10*3/uL — AB (ref 0.9–3.3)
Lymphocytes Relative: 5 %
MCH: 31.5 pg (ref 27.2–33.4)
MCHC: 32.6 g/dL (ref 32.0–36.0)
MCV: 96.5 fL (ref 79.3–98.0)
MONO ABS: 0.8 10*3/uL (ref 0.1–0.9)
MONOS PCT: 10 %
Neutro Abs: 6.8 10*3/uL — ABNORMAL HIGH (ref 1.5–6.5)
Neutrophils Relative %: 85 %
PLATELETS: 124 10*3/uL — AB (ref 140–400)
RBC: 2 MIL/uL — AB (ref 4.20–5.82)
RDW: 20.1 % — ABNORMAL HIGH (ref 11.0–14.6)
WBC Count: 8.1 10*3/uL (ref 4.0–10.3)

## 2017-06-27 MED ORDER — DIPHENHYDRAMINE HCL 25 MG PO CAPS
ORAL_CAPSULE | ORAL | Status: AC
  Filled 2017-06-27: qty 1

## 2017-06-27 MED ORDER — ACETAMINOPHEN 325 MG PO TABS
ORAL_TABLET | ORAL | Status: AC
  Filled 2017-06-27: qty 2

## 2017-06-27 MED ORDER — HEPARIN SOD (PORK) LOCK FLUSH 100 UNIT/ML IV SOLN
500.0000 [IU] | Freq: Every day | INTRAVENOUS | Status: AC | PRN
  Administered 2017-06-27: 500 [IU]
  Filled 2017-06-27: qty 5

## 2017-06-27 MED ORDER — SODIUM CHLORIDE 0.9% FLUSH
10.0000 mL | INTRAVENOUS | Status: AC | PRN
  Administered 2017-06-27: 10 mL
  Filled 2017-06-27: qty 10

## 2017-06-27 MED ORDER — SODIUM CHLORIDE 0.9% FLUSH
10.0000 mL | INTRAVENOUS | Status: DC | PRN
  Administered 2017-06-27: 10 mL via INTRAVENOUS
  Filled 2017-06-27: qty 10

## 2017-06-27 MED ORDER — DIPHENHYDRAMINE HCL 25 MG PO CAPS
25.0000 mg | ORAL_CAPSULE | Freq: Once | ORAL | Status: AC
  Administered 2017-06-27: 25 mg via ORAL

## 2017-06-27 MED ORDER — ACETAMINOPHEN 325 MG PO TABS
650.0000 mg | ORAL_TABLET | Freq: Once | ORAL | Status: AC
  Administered 2017-06-27: 650 mg via ORAL

## 2017-06-27 MED ORDER — SODIUM CHLORIDE 0.9 % IV SOLN
250.0000 mL | Freq: Once | INTRAVENOUS | Status: AC
  Administered 2017-06-27: 250 mL via INTRAVENOUS

## 2017-06-27 MED ORDER — MORPHINE SULFATE ER 15 MG PO TBCR: 15.0000 mg | EXTENDED_RELEASE_TABLET | Freq: Two times a day (BID) | ORAL | 0 refills | Status: AC

## 2017-06-27 MED ORDER — OXYCODONE-ACETAMINOPHEN 5-325 MG PO TABS: 1.0000 | ORAL_TABLET | Freq: Four times a day (QID) | ORAL | 0 refills | Status: AC | PRN

## 2017-06-27 MED FILL — OXYCODONE-ACETAMINOPHEN 5-3: 5-325 | 15 days supply | Qty: 60 | Fill #0

## 2017-06-27 MED FILL — MORPHINE SULF ER 15 MG TAB: 15 | 30 days supply | Qty: 60 | Fill #0

## 2017-06-27 NOTE — Patient Instructions (Signed)
Blood Transfusion, Adult, Care After This sheet gives you information about how to care for yourself after your procedure. Your health care provider may also give you more specific instructions. If you have problems or questions, contact your health care provider. What can I expect after the procedure? After your procedure, it is common to have:  Bruising and soreness where the IV tube was inserted.  Headache.  Follow these instructions at home:  Take over-the-counter and prescription medicines only as told by your health care provider.  Return to your normal activities as told by your health care provider.  Follow instructions from your health care provider about how to take care of your IV insertion site. Make sure you: ? Wash your hands with soap and water before you change your bandage (dressing). If soap and water are not available, use hand sanitizer. ? Change your dressing as told by your health care provider.  Check your IV insertion site every day for signs of infection. Check for: ? More redness, swelling, or pain. ? More fluid or blood. ? Warmth. ? Pus or a bad smell. Contact a health care provider if:  You have more redness, swelling, or pain around the IV insertion site.  You have more fluid or blood coming from the IV insertion site.  Your IV insertion site feels warm to the touch.  You have pus or a bad smell coming from the IV insertion site.  Your urine turns pink, red, or brown.  You feel weak after doing your normal activities. Get help right away if:  You have signs of a serious allergic or immune system reaction, including: ? Itchiness. ? Hives. ? Trouble breathing. ? Anxiety. ? Chest or lower back pain. ? Fever, flushing, and chills. ? Rapid pulse. ? Rash. ? Diarrhea. ? Vomiting. ? Dark urine. ? Serious headache. ? Dizziness. ? Stiff neck. ? Yellow coloration of the face or the white parts of the eyes (jaundice). This information is not  intended to replace advice given to you by your health care provider. Make sure you discuss any questions you have with your health care provider. Document Released: 02/21/2014 Document Revised: 09/30/2015 Document Reviewed: 08/17/2015 Elsevier Interactive Patient Education  2018 Elsevier Inc.  

## 2017-06-27 NOTE — Progress Notes (Signed)
Nutrition Follow-up:  Patient with prostate cancer with metastatic disease to the bone.  Patient receiving chemotherapy (taxotere) and androgen deprivation. Chemo on hold for today, receiving 2 units of packed red blood cells.    Met with patient during infusion.  Patient reports pain has been his number one problem effecting appetite.  Reports that his pain medication will be adjusted today and hoping that it will work.  "I just can't eat because I am in so much pain."  Noted considering palliative radiation to left hip for pain relief.  Medications: reviewed  Labs: reviewed  Anthropometrics:   Weight has decreased to 176 lb 8 oz today from 196 lb 5.1 oz on 3/25.    10% weight loss in the last 2 months, significant   NUTRITION DIAGNOSIS: Inadequate oral intake continues   MALNUTRITION DIAGNOSIS: likely with 10% weight loss in the last 2 months   INTERVENTION:   Patient aware of importance of nutrition. Reviewed high calorie, high protein foods with patient Encouraged drinking at least 2 or more high calorie shakes.  Patient reports he likes them and are able to drink them. Pain medications are being adjusted today    MONITORING, EVALUATION, GOAL: Patient will consume adequate calories and protein to meet nutritional needs and prevent further weight loss.    NEXT VISIT: June 5 during infusion   Zyren Sevigny B. Zenia Resides, [place], [place] Registered Dietitian [phone] (pager)

## 2017-06-28 ENCOUNTER — Telehealth: Payer: Self-pay | Admitting: *Deleted

## 2017-06-28 ENCOUNTER — Other Ambulatory Visit: Payer: Self-pay | Admitting: Cardiovascular Disease

## 2017-06-28 ENCOUNTER — Encounter: Payer: Self-pay | Admitting: Radiation Oncology

## 2017-06-28 LAB — TYPE AND SCREEN
ABO/RH(D): O POS
Antibody Screen: NEGATIVE
UNIT DIVISION: 0
Unit division: 0

## 2017-06-28 LAB — BPAM RBC
BLOOD PRODUCT EXPIRATION DATE: 201906042359
BLOOD PRODUCT EXPIRATION DATE: 201906042359
ISSUE DATE / TIME: 201905141120
ISSUE DATE / TIME: 201905141120
UNIT TYPE AND RH: 5100
UNIT TYPE AND RH: 5100

## 2017-06-28 LAB — PROSTATE-SPECIFIC AG, SERUM (LABCORP): PROSTATE SPECIFIC AG, SERUM: 217.7 ng/mL — AB (ref 0.0–4.0)

## 2017-06-28 NOTE — Telephone Encounter (Signed)
As noted below by Dr. Shadad, I informed patient of his PSA level. He verbalized understanding.  

## 2017-06-28 NOTE — Telephone Encounter (Signed)
-----   Message from [name], MD sent at 06/28/2017  8:44 AM EDT ----- Please let him know his PSA is down.

## 2017-07-12 ENCOUNTER — Ambulatory Visit
Admission: RE | Admit: 2017-07-12 | Discharge: 2017-07-12 | Disposition: A | Discharge status: Home / Self Care | Payer: Medicare Other | Source: Ambulatory Visit | Attending: Radiation Oncology | Admitting: Radiation Oncology

## 2017-07-12 ENCOUNTER — Ambulatory Visit: Admission: RE | Admit: 2017-07-12 | Payer: Medicare Other | Source: Ambulatory Visit

## 2017-07-19 ENCOUNTER — Inpatient Hospital Stay: Payer: Medicare Other

## 2017-07-19 ENCOUNTER — Inpatient Hospital Stay: Payer: Medicare Other | Admitting: Nutrition

## 2017-07-19 ENCOUNTER — Inpatient Hospital Stay: Payer: Medicare Other | Attending: Oncology | Admitting: Oncology

## 2017-07-19 VITALS — BP 105/70 | HR 96 | Temp 97.8°F | Resp 18 | Ht 71.0 in

## 2017-07-19 DIAGNOSIS — Z5111 Encounter for antineoplastic chemotherapy: Secondary | ICD-10-CM

## 2017-07-19 DIAGNOSIS — Z192 Hormone resistant malignancy status: Secondary | ICD-10-CM

## 2017-07-19 DIAGNOSIS — C61 Malignant neoplasm of prostate: Secondary | ICD-10-CM

## 2017-07-19 DIAGNOSIS — Z79899 Other long term (current) drug therapy: Secondary | ICD-10-CM | POA: Diagnosis not present

## 2017-07-19 DIAGNOSIS — Z95828 Presence of other vascular implants and grafts: Secondary | ICD-10-CM

## 2017-07-19 DIAGNOSIS — C787 Secondary malignant neoplasm of liver and intrahepatic bile duct: Secondary | ICD-10-CM | POA: Insufficient documentation

## 2017-07-19 DIAGNOSIS — I4891 Unspecified atrial fibrillation: Secondary | ICD-10-CM | POA: Insufficient documentation

## 2017-07-19 DIAGNOSIS — Z7901 Long term (current) use of anticoagulants: Secondary | ICD-10-CM | POA: Insufficient documentation

## 2017-07-19 DIAGNOSIS — Z923 Personal history of irradiation: Secondary | ICD-10-CM | POA: Insufficient documentation

## 2017-07-19 DIAGNOSIS — D649 Anemia, unspecified: Secondary | ICD-10-CM

## 2017-07-19 DIAGNOSIS — C7951 Secondary malignant neoplasm of bone: Secondary | ICD-10-CM | POA: Diagnosis not present

## 2017-07-19 DIAGNOSIS — D630 Anemia in neoplastic disease: Secondary | ICD-10-CM | POA: Diagnosis not present

## 2017-07-19 DIAGNOSIS — G893 Neoplasm related pain (acute) (chronic): Secondary | ICD-10-CM

## 2017-07-19 LAB — CBC WITH DIFFERENTIAL (CANCER CENTER ONLY)
BASOS ABS: 0 10*3/uL (ref 0.0–0.1)
BASOS PCT: 0 %
Eosinophils Absolute: 0 10*3/uL (ref 0.0–0.5)
Eosinophils Relative: 1 %
HEMATOCRIT: 30.4 % — AB (ref 38.4–49.9)
HEMOGLOBIN: 10.2 g/dL — AB (ref 13.0–17.1)
LYMPHS PCT: 13 %
Lymphs Abs: 0.6 10*3/uL — ABNORMAL LOW (ref 0.9–3.3)
MCH: 30.9 pg (ref 27.2–33.4)
MCHC: 33.6 g/dL (ref 32.0–36.0)
MCV: 92.1 fL (ref 79.3–98.0)
MONOS PCT: 11 %
Monocytes Absolute: 0.5 10*3/uL (ref 0.1–0.9)
NEUTROS ABS: 3.3 10*3/uL (ref 1.5–6.5)
NEUTROS PCT: 75 %
Platelet Count: 158 10*3/uL (ref 140–400)
RBC: 3.3 MIL/uL — ABNORMAL LOW (ref 4.20–5.82)
RDW: 19.3 % — ABNORMAL HIGH (ref 11.0–14.6)
WBC Count: 4.4 10*3/uL (ref 4.0–10.3)

## 2017-07-19 LAB — SAMPLE TO BLOOD BANK

## 2017-07-19 LAB — CMP (CANCER CENTER ONLY)
ALBUMIN: 3.2 g/dL — AB (ref 3.5–5.0)
ALK PHOS: 83 U/L (ref 40–150)
ALT: 8 U/L (ref 0–55)
AST: 18 U/L (ref 5–34)
Anion gap: 12 — ABNORMAL HIGH (ref 3–11)
BILIRUBIN TOTAL: 0.8 mg/dL (ref 0.2–1.2)
BUN: 16 mg/dL (ref 7–26)
CALCIUM: 8.3 mg/dL — AB (ref 8.4–10.4)
CO2: 19 mmol/L — ABNORMAL LOW (ref 22–29)
CREATININE: 0.81 mg/dL (ref 0.70–1.30)
Chloride: 100 mmol/L (ref 98–109)
GFR, Est AFR Am: >60 mL/min (ref >60)
GLUCOSE: 149 mg/dL — AB (ref 70–140)
POTASSIUM: 3.8 mmol/L (ref 3.5–5.1)
Sodium: 131 mmol/L — ABNORMAL LOW (ref 136–145)
Total Protein: 5.6 g/dL — ABNORMAL LOW (ref 6.4–8.3)

## 2017-07-19 MED ORDER — SODIUM CHLORIDE 0.9 % IV SOLN
Freq: Once | INTRAVENOUS | Status: AC
  Administered 2017-07-19: 11:00:00 via INTRAVENOUS

## 2017-07-19 MED ORDER — PEGFILGRASTIM 6 MG/0.6ML ~~LOC~~ PSKT
PREFILLED_SYRINGE | SUBCUTANEOUS | Status: AC
  Filled 2017-07-19: qty 0.6

## 2017-07-19 MED ORDER — PEGFILGRASTIM 6 MG/0.6ML ~~LOC~~ PSKT
6.0000 mg | PREFILLED_SYRINGE | Freq: Once | SUBCUTANEOUS | Status: AC
  Administered 2017-07-19: 6 mg via SUBCUTANEOUS

## 2017-07-19 MED ORDER — SODIUM CHLORIDE 0.9 % IV SOLN
60.0000 mg/m2 | Freq: Once | INTRAVENOUS | Status: AC
  Administered 2017-07-19: 130 mg via INTRAVENOUS
  Filled 2017-07-19: qty 13

## 2017-07-19 MED ORDER — MORPHINE SULFATE (PF) 4 MG/ML IV SOLN
INTRAVENOUS | Status: AC
Start: 2017-07-19 — End: ?
  Filled 2017-07-19: qty 1

## 2017-07-19 MED ORDER — SODIUM CHLORIDE 0.9% FLUSH
10.0000 mL | INTRAVENOUS | Status: DC | PRN
  Administered 2017-07-19 (×2): 10 mL
  Filled 2017-07-19: qty 10

## 2017-07-19 MED ORDER — HEPARIN SOD (PORK) LOCK FLUSH 100 UNIT/ML IV SOLN
500.0000 [IU] | Freq: Once | INTRAVENOUS | Status: AC | PRN
  Administered 2017-07-19: 500 [IU]
  Filled 2017-07-19: qty 5

## 2017-07-19 MED ORDER — MORPHINE SULFATE 4 MG/ML IJ SOLN
2.0000 mg | INTRAMUSCULAR | Status: DC | PRN
  Administered 2017-07-19 (×2): 2 mg via INTRAVENOUS
  Filled 2017-07-19: qty 1

## 2017-07-19 MED ORDER — MORPHINE SULFATE (PF) 4 MG/ML IV SOLN
INTRAVENOUS | Status: AC
  Filled 2017-07-19: qty 1

## 2017-07-19 MED ORDER — DEXAMETHASONE SODIUM PHOSPHATE 10 MG/ML IJ SOLN
INTRAMUSCULAR | Status: AC
  Filled 2017-07-19: qty 1

## 2017-07-19 MED ORDER — DEXAMETHASONE SODIUM PHOSPHATE 10 MG/ML IJ SOLN
10.0000 mg | Freq: Once | INTRAMUSCULAR | Status: AC
  Administered 2017-07-19: 10 mg via INTRAVENOUS

## 2017-07-19 NOTE — Patient Instructions (Signed)
[name] Discharge Instructions for Patients Receiving Chemotherapy  Today you received the following chemotherapy agents: taxotere.  To help prevent nausea and vomiting after your treatment, we encourage you to take your nausea medication as prescribed.   If you develop nausea and vomiting that is not controlled by your nausea medication, call the clinic.   BELOW ARE SYMPTOMS THAT SHOULD BE REPORTED IMMEDIATELY:  *FEVER GREATER THAN 100.5 F  *CHILLS WITH OR WITHOUT FEVER  NAUSEA AND VOMITING THAT IS NOT CONTROLLED WITH YOUR NAUSEA MEDICATION  *UNUSUAL SHORTNESS OF BREATH  *UNUSUAL BRUISING OR BLEEDING  TENDERNESS IN MOUTH AND THROAT WITH OR WITHOUT PRESENCE OF ULCERS  *URINARY PROBLEMS  *BOWEL PROBLEMS  UNUSUAL RASH Items with * indicate a potential emergency and should be followed up as soon as possible.  Feel free to call the clinic should you have any questions or concerns. The clinic phone number is [phone].  Please show the CHEMO ALERT CARD at check-in to the Emergency Department and triage nurse.   

## 2017-07-19 NOTE — Progress Notes (Signed)
Hematology and Oncology Follow Up Visit  [name] [name] [id number] November 10, 1940 77 y.o. 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  He has Gleason score of 3+4 = 7 and a PSA of 4.4 diagnosed in 2010.   Prior Therapy: He was treated with external beam radiation completed in October 2010.   He was started on Firmagon by Dr. Jeffie Pollock in May 2015 and his PSA dropped to 18 in October 2015 after he developed recurrent disease with lymphadenopathy.   He developed castration resistant disease with a rising PSA up to 48. Xtandi 160 mg daily started in May 2016. Therapy discontinued in November 2017 because of progression of disease.  Xofigo monthly basis started on 10/26/2016.  He completed 6 cycles of therapy.  Zytiga 1000 mg daily with prednisone started in December 2017.  Therapy discontinued on May 05, 2017 because of progression of disease.  Current therapy:  Androgen deprivation under the care of Dr. Jeffie Pollock.  Taxotere chemotherapy at 60 mg/m square started on April second 2019.  He is here for cycle 3 of therapy.    Interim History:  Mr. [name] [name] presents today for a follow-up.  Since the last visit, he was hospitalized briefly in [place] for complaints of increased pain as well as anemia.  During his hospitalization he received 2 units of packed red cell transfusion as well as started on a fentanyl patch at 75 mcg every 72 hours.  Since his discharge, he reports the fentanyl patch has improved his pain but still has issues with left-sided hip discomfort.  His pain is worse when he is sitting or standing and has been debilitated with limited mobility.  He has been able to eat better and maintain his weight but his performance status continues to decline.  His main concern continues to be his hip pain.  He did have  an MRI of the pelvis obtained in May 2019 which showed diffuse bony metastasis as well as mildly displaced fracture of the S1 vertebral body consistent with pathological fracture.  He was scheduled to have radiation evaluation but he not make that appointment because of his hospitalization.  He does not report any headaches, blurry vision, double vision or seizures.  He does not report any fevers, chills, sweats. He does not report any chest pain, palpitation, orthopnea or PND. Does not report any cough or shortness of breath. He does not report any nausea, vomiting, abdominal pain.  He denies any constipation or diarrhea.  He does not report any urgency, hesitancy. Does not report any hematuria or dysuria.  He does not report any heat or cold intolerance.  He does not report any skin rashes or lesions.  He does not report any lymphadenopathy or petechiae.   Rest of his review of systems is negative.   Medications: I have reviewed the patient's current medications.  Current Outpatient Medications  Medication Sig Dispense Refill  . Calcium Citrate-Vitamin D (CITRACAL + D PO) Take 2 tablets by mouth daily.    . Cholecalciferol (VITAMIN D3) 5000 units CAPS Take 5,000 Units by mouth daily.    Marland Kitchen ELIQUIS 5 MG TABS tablet TAKE 1 TABLET BY MOUTH TWO  TIMES DAILY 180 tablet 3  . hydrALAZINE (APRESOLINE) 50 MG tablet Take 1 tablet (50 mg total) by mouth 2 (two) times daily. 180 tablet 3  . irbesartan (AVAPRO) 150  MG tablet Take 150 mg by mouth daily.    Marland Kitchen latanoprost (XALATAN) 0.005 % ophthalmic solution Place 1 drop into both eyes at bedtime.     . lidocaine-prilocaine (EMLA) cream Apply 1 application topically as needed. 30 g 0  . meclizine (ANTIVERT) 25 MG tablet Take 25 mg by mouth 3 (three) times daily as needed for dizziness.    . metoprolol tartrate (LOPRESSOR) 100 MG tablet Take 1 tablet (100 mg total) by mouth 2 (two) times daily. 180 tablet 3  . metoprolol tartrate (LOPRESSOR) 100 MG tablet TAKE ONE  TABLET BY MOUTH TWICE A DAY 180 tablet 0  . morphine (MS CONTIN) 15 MG 12 hr tablet Take 1 tablet (15 mg total) by mouth every 12 (twelve) hours. 60 tablet 0  . oxyCODONE-acetaminophen (PERCOCET/ROXICET) 5-325 MG tablet Take 1 tablet by mouth every 6 (six) hours as needed. 60 tablet 0  . potassium chloride SA (K-DUR,KLOR-CON) 20 MEQ tablet Take 1 tablet (20 mEq total) by mouth daily. 30 tablet 1  . prochlorperazine (COMPAZINE) 10 MG tablet Take 1 tablet (10 mg total) by mouth every 6 (six) hours as needed for nausea or vomiting. 30 tablet 3  . simvastatin (ZOCOR) 20 MG tablet Take 20 mg by mouth daily at 6 PM.     . valsartan (DIOVAN) 160 MG tablet      No current facility-administered medications for this visit.    Facility-Administered Medications Ordered in Other Visits  Medication Dose Route Frequency Provider Last Rate Last Dose  . sodium chloride flush (NS) 0.9 % injection 10 mL  10 mL Intracatheter PRN [name], MD   10 mL at 07/19/17 2778     Allergies: No Known Allergies  Past Medical History, Surgical history, Social history, and Family History reviewed personally and unchanged.  Physical Exam: Blood pressure 105/70, pulse 96, temperature 97.8 F (36.6 C), temperature source Oral, resp. rate 18, height 5\' 11"  (1.803 m), SpO2 99 %.   ECOG: 1 General appearance: Ill-appearing gentleman with mild distress. Head: Normocephalic without abnormalities. Oral mucosa: No oral thrush or ulcers. Eyes: Sclera anicteric. Lymph nodes: No cervical, supraclavicular, inguinal or axillary nodes Heart: Irregular rhythm controlled rate.  No murmurs. Lung: Clear in all lung fields without any rhonchi, wheezes or dullness to percussion. Abdomin: Soft, without any rebound or guarding.  No shifting dullness or ascites. Skin: Ecchymosis noted on his upper arms. Musculoskeletal: No clubbing or cyanosis noted. Neurological: Generalized weakness without any clear focal deficits.  Some  weakness noted in his left side hip flexors.   Lab Results: Lab Results  Component Value Date   WBC 8.1 06/27/2017   HGB 6.3 (LL) 06/27/2017   HCT 19.3 (L) 06/27/2017   MCV 96.5 06/27/2017   PLT 124 (L) 06/27/2017     Chemistry      Component Value Date/Time   NA 129 (L) 06/27/2017 0810   NA 141 10/18/2016 0951   K 4.3 06/27/2017 0810   K 3.5 10/18/2016 0951   CL 100 06/27/2017 0810   CO2 22 06/27/2017 0810   CO2 29 10/18/2016 0951   BUN 20 06/27/2017 0810   BUN 16.6 10/18/2016 0951   CREATININE 0.79 06/27/2017 0810   CREATININE 1.0 10/18/2016 0951      Component Value Date/Time   CALCIUM 8.1 (L) 06/27/2017 0810   CALCIUM 9.6 10/18/2016 0951   ALKPHOS 61 06/27/2017 0810   ALKPHOS 107 10/18/2016 0951   AST 12 06/27/2017 0810   AST 22 10/18/2016  0951   ALT 9 06/27/2017 0810   ALT 17 10/18/2016 0951   BILITOT 0.5 06/27/2017 0810   BILITOT 0.86 10/18/2016 0951      Results for ROSA, GAMBALE (MRN [id number]) as of 07/19/2017 10:16  Ref. Range 06/06/2017 09:03 06/27/2017 08:10  Prostate Specific Ag, Serum Latest Ref Range: 0.0 - 4.0 ng/mL 340.5 (H) 217.7 (H)       Assessment and Plan:   77 year old man with:  1.  Castration-resistant prostate cancer that is rapidly progressing with that disease to the bone as well as hepatic metastasis.  He is receiving Taxotere chemotherapy for palliative purposes and tolerated 2 cycles without issues.  He appears well enough to proceed with cycle 3 of chemotherapy today despite his overall debilitation.  Risks and benefits of proceeding with chemotherapy today was reviewed and is agreeable to proceed.  His labs are currently pending and if his CBC is close to normal range and with safe absolute neutrophil counts will proceed with chemotherapy.  2. Androgen depravation therapy: Remains on Lupron which we will continue indefinitely.  3. Bone directed therapy: He remains on calcium and vitamin D.  Delton See has not been started  because of dental complications.  4. Atrial fibrillation: Rate controlled at this time without any recent exacerbations.  5.  IV access: Port-A-Cath remains in use without issues.  6.  Antiemetics: No nausea or vomiting reported at this time.  His appetite remained reasonable.  7.  Goals of care: The goal of his treatment is palliative at this time.  His prognosis is poor given his decline in his overall performance status and rapidly progressing malignancy.  Taxotere chemotherapy has been able to decrease the PSA by close to 50%.  He is willing to proceed with chemotherapy at this time despite these findings.  8.  Anemia: Related to malignancy and chemotherapy.  He received 2 units of packed red cell transfusion during his [place] hospitalization.  His CBC is currently pending from today and will transfuse as needed.  9. Pelvic pain: MRI obtained on Jun 23, 2017 showed diffuse metastatic bony disease with mild displaced fracture of the S1 vertebral body as well as a nondisplaced fracture of the L5 pedicle.  He is scheduled to have evaluation by radiation oncology in the near future.  He is on fentanyl patch with breakthrough Percocet.  His pain is reasonably controlled when he is laying down although his pain is worse today because of transportation.  I will give him morphine in anticipation of receiving chemotherapy today.  10. Follow-up: Will be in 3 weeks for the next cycle of chemotherapy.  25  minutes was spent with the patient face-to-face today.  More than 50% of time was dedicated to patient counseling, education and discussing future plan of care.  Zola Button, MD 6/5/201910:23 AM

## 2017-07-19 NOTE — Progress Notes (Signed)
Nutrition follow-up completed with patient and his wife during infusion for metastatic prostate cancer. Patient states he was unable to stand to weigh today because of increased pain. Patient continues to have decreased oral intake secondary to increased pain  Nutrition diagnosis: Inadequate oral intake likely continues.  Malnutrition diagnosis: Likely with 10% weight loss in the last 2 months.  Intervention: Enforced the importance of adequate calories and protein. Provided suggestions on high-calorie high-protein meals and snacks. Encourage patient to consume most of his calories early in the day because later on his pain is too intense. Encouraged him to continue working with physician for better pain control.  Monitoring, evaluation, goals: Patient will work to increase calories and protein to minimize weight loss.  Next visit: To be scheduled as needed.  **Disclaimer: This note was dictated with voice recognition software. Similar sounding words can inadvertently be transcribed and this note may contain transcription errors which may not have been corrected upon publication of note.**

## 2017-07-20 ENCOUNTER — Telehealth: Payer: Self-pay

## 2017-07-20 LAB — PROSTATE-SPECIFIC AG, SERUM (LABCORP): PROSTATE SPECIFIC AG, SERUM: 292.2 ng/mL — AB (ref 0.0–4.0)

## 2017-07-20 NOTE — Telephone Encounter (Signed)
Called and tried to leave a msg for the patient concerning his upcoming appointments, how ever the mail box was full. Mailed a letter and calender enclosed per 6/4 los

## 2017-07-24 ENCOUNTER — Ambulatory Visit: Payer: Medicare Other | Admitting: Radiation Oncology

## 2017-07-24 ENCOUNTER — Ambulatory Visit: Payer: Medicare Other

## 2017-07-27 ENCOUNTER — Telehealth: Payer: Self-pay

## 2017-07-28 ENCOUNTER — Telehealth: Payer: Self-pay

## 2017-07-28 NOTE — Telephone Encounter (Signed)
Spoke with [name] [name] concerning moving this patient appointment per sch msg request. per 6/14

## 2017-08-01 ENCOUNTER — Telehealth: Payer: Self-pay | Admitting: *Deleted

## 2017-08-01 NOTE — Telephone Encounter (Signed)
Wife calling to cancel chemo appts. Patient expired. appts already cancelled.

## 2017-08-10 ENCOUNTER — Other Ambulatory Visit: Payer: Medicare Other

## 2017-08-10 ENCOUNTER — Ambulatory Visit: Payer: Medicare Other | Admitting: Oncology

## 2017-08-10 ENCOUNTER — Ambulatory Visit: Payer: Medicare Other

## 2017-08-14 DEATH — deceased

## 2017-08-31 ENCOUNTER — Other Ambulatory Visit: Payer: Medicare Other

## 2017-08-31 ENCOUNTER — Ambulatory Visit: Payer: Medicare Other

## 2017-08-31 ENCOUNTER — Ambulatory Visit: Payer: Medicare Other | Admitting: Oncology

## 2017-09-04 ENCOUNTER — Ambulatory Visit: Payer: Medicare Other | Admitting: Radiation Oncology

## 2019-05-04 IMAGING — NM NM BONE WHOLE BODY
4 series · 4 of 4 positions shown · non-contrast
Comparison: Bone scan of 09/06/2016

CLINICAL DATA: History of prostate carcinoma, weakness, known bone
metastasis

EXAM:
NUCLEAR MEDICINE WHOLE BODY BONE SCAN
TECHNIQUE: Whole body anterior and posterior images were obtained approximately
3 hours after intravenous injection of radiopharmaceutical.
RADIOPHARMACEUTICALS:  M 19.1 Ci Technetium-NNm MDP IV

[Series 1: wbr_bone_60 whole body · 2.66mm/px · 1 of 1 slices shown (1 of 2)]
[im 1/1]
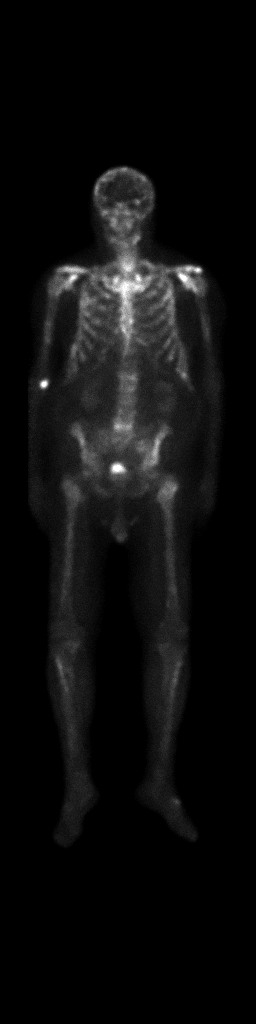

[Series 1: whole body · 2.66mm/px · 1 of 1 slices shown (1 of 2)]
[im 1/1]
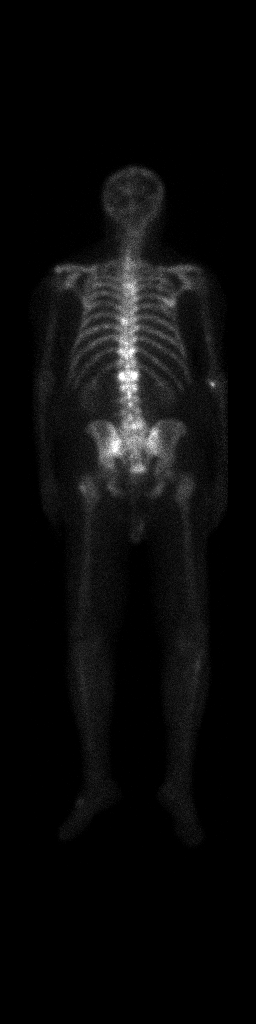

[Series 1: whole body · 2.66mm/px · 1 of 1 slices shown (2 of 2)]
[im 1/1]
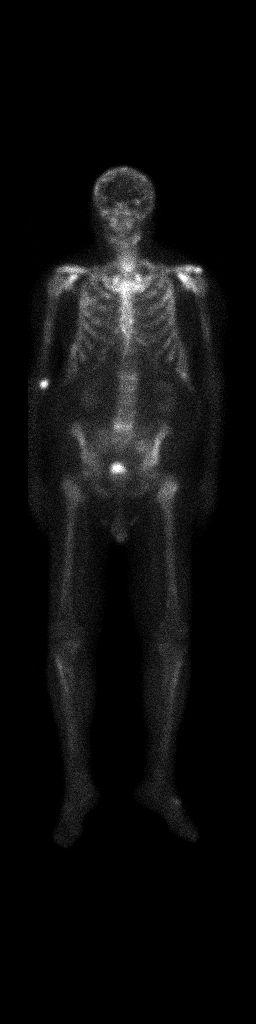

[Series 1: wbr_bone_60 whole body · 2.66mm/px · 1 of 1 slices shown (2 of 2)]
[im 1/1]
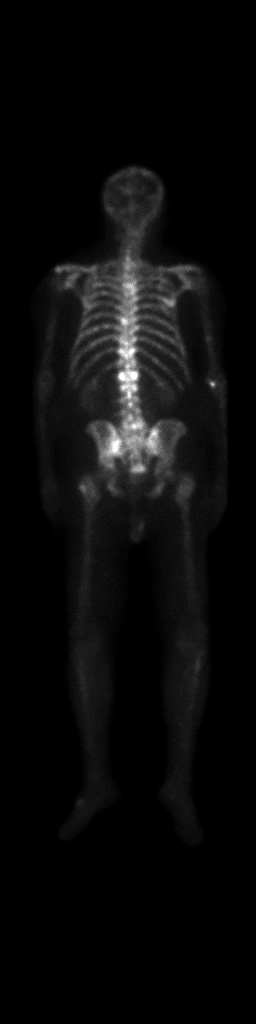

[4 of 4 positions shown; findings below may reference images not displayed]

FINDINGS: There are again small foci of activity noted within the lower left
sacrum,, anterior lower bilateral ribs, left iliac wing, and within
the upper, mid, and lower thoracic spine as well as the upper and
lower lumbar spine. However, these areas have diminished in activity
somewhat indicating some positive interval response to therapy. The
only questionably new activity is within the anterior left lower
ninth rib. Activity throughout the femurs is somewhat mottled, which
is not seen previously and this finding is of uncertain significance
but follow-up is recommended. Activity in the left foot most likely
is degenerative but has diminished.
IMPRESSION: Overall improvement in the foci of increased activity indicating
some positive response to the diffuse bone metastases. The only
questionably new focus is within an anterior left lower rib
presumably the left anterior ninth rib. Also, there is slightly
mottled activity throughout the femurs of questionable significance.
